# Patient Record
Sex: Female | Born: 1960 | Race: Black or African American | Hispanic: No | Smoking: Former smoker
Health system: Southern US, Community
[De-identification: ages and names within clinical notes are randomized; demographics above are authoritative.]

## PROBLEM LIST (undated history)

## (undated) DIAGNOSIS — I890 Lymphedema, not elsewhere classified: Secondary | ICD-10-CM

## (undated) DIAGNOSIS — Z9221 Personal history of antineoplastic chemotherapy: Secondary | ICD-10-CM

## (undated) DIAGNOSIS — F32A Depression, unspecified: Secondary | ICD-10-CM

## (undated) DIAGNOSIS — K5900 Constipation, unspecified: Secondary | ICD-10-CM

## (undated) DIAGNOSIS — B009 Herpesviral infection, unspecified: Secondary | ICD-10-CM

## (undated) DIAGNOSIS — J40 Bronchitis, not specified as acute or chronic: Secondary | ICD-10-CM

## (undated) DIAGNOSIS — Z803 Family history of malignant neoplasm of breast: Secondary | ICD-10-CM

## (undated) DIAGNOSIS — M2550 Pain in unspecified joint: Secondary | ICD-10-CM

## (undated) DIAGNOSIS — S8292XA Unspecified fracture of left lower leg, initial encounter for closed fracture: Secondary | ICD-10-CM

## (undated) DIAGNOSIS — I10 Essential (primary) hypertension: Secondary | ICD-10-CM

## (undated) DIAGNOSIS — M1990 Unspecified osteoarthritis, unspecified site: Secondary | ICD-10-CM

## (undated) DIAGNOSIS — E785 Hyperlipidemia, unspecified: Secondary | ICD-10-CM

## (undated) DIAGNOSIS — R519 Headache, unspecified: Secondary | ICD-10-CM

## (undated) DIAGNOSIS — G9332 Myalgic encephalomyelitis/chronic fatigue syndrome: Secondary | ICD-10-CM

## (undated) DIAGNOSIS — R0602 Shortness of breath: Secondary | ICD-10-CM

## (undated) DIAGNOSIS — R7303 Prediabetes: Secondary | ICD-10-CM

## (undated) DIAGNOSIS — C50919 Malignant neoplasm of unspecified site of unspecified female breast: Secondary | ICD-10-CM

## (undated) DIAGNOSIS — C801 Malignant (primary) neoplasm, unspecified: Secondary | ICD-10-CM

## (undated) DIAGNOSIS — R011 Cardiac murmur, unspecified: Secondary | ICD-10-CM

## (undated) DIAGNOSIS — Z91018 Allergy to other foods: Secondary | ICD-10-CM

## (undated) DIAGNOSIS — E119 Type 2 diabetes mellitus without complications: Secondary | ICD-10-CM

## (undated) DIAGNOSIS — G4733 Obstructive sleep apnea (adult) (pediatric): Secondary | ICD-10-CM

## (undated) DIAGNOSIS — Z9989 Dependence on other enabling machines and devices: Secondary | ICD-10-CM

## (undated) DIAGNOSIS — F419 Anxiety disorder, unspecified: Secondary | ICD-10-CM

## (undated) DIAGNOSIS — M549 Dorsalgia, unspecified: Secondary | ICD-10-CM

## (undated) DIAGNOSIS — F329 Major depressive disorder, single episode, unspecified: Secondary | ICD-10-CM

## (undated) DIAGNOSIS — R51 Headache: Secondary | ICD-10-CM

## (undated) DIAGNOSIS — R5382 Chronic fatigue, unspecified: Secondary | ICD-10-CM

## (undated) DIAGNOSIS — Z923 Personal history of irradiation: Secondary | ICD-10-CM

## (undated) DIAGNOSIS — K219 Gastro-esophageal reflux disease without esophagitis: Secondary | ICD-10-CM

## (undated) HISTORY — DX: Family history of malignant neoplasm of breast: Z80.3

## (undated) HISTORY — DX: Hyperlipidemia, unspecified: E78.5

## (undated) HISTORY — DX: Herpesviral infection, unspecified: B00.9

## (undated) HISTORY — DX: Myalgic encephalomyelitis/chronic fatigue syndrome: G93.32

## (undated) HISTORY — DX: Allergy to other foods: Z91.018

## (undated) HISTORY — PX: ABDOMINAL HYSTERECTOMY: SHX81

## (undated) HISTORY — DX: Dorsalgia, unspecified: M54.9

## (undated) HISTORY — DX: Lymphedema, not elsewhere classified: I89.0

## (undated) HISTORY — PX: HAND SURGERY: SHX662

## (undated) HISTORY — DX: Shortness of breath: R06.02

## (undated) HISTORY — DX: Essential (primary) hypertension: I10

## (undated) HISTORY — DX: Constipation, unspecified: K59.00

## (undated) HISTORY — PX: EYE SURGERY: SHX253

## (undated) HISTORY — DX: Pain in unspecified joint: M25.50

## (undated) HISTORY — DX: Prediabetes: R73.03

## (undated) HISTORY — DX: Malignant neoplasm of unspecified site of unspecified female breast: C50.919

## (undated) HISTORY — DX: Chronic fatigue, unspecified: R53.82

## (undated) HISTORY — PX: TUBAL LIGATION: SHX77

---
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
## 2006-04-06 ENCOUNTER — Ambulatory Visit: Payer: Self-pay | Admitting: Ophthalmology

## 2006-04-12 ENCOUNTER — Ambulatory Visit: Payer: Self-pay | Admitting: Ophthalmology

## 2015-08-06 ENCOUNTER — Telehealth: Payer: Self-pay

## 2015-08-06 NOTE — Telephone Encounter (Signed)
PATIENT RECEIVED LETTER TO SCHEDULE TCS [phone]

## 2015-08-12 ENCOUNTER — Telehealth: Payer: Self-pay

## 2015-08-18 NOTE — Telephone Encounter (Signed)
See triage

## 2015-08-18 NOTE — Telephone Encounter (Signed)
Gastroenterology Pre-Procedure Review  Request Date: 08/12/2015 Requesting Physician: Dr. Scotty Court  PATIENT REVIEW QUESTIONS: The patient responded to the following health history questions as indicated:    1. Diabetes Melitis: YES 2. Joint replacements in the past 12 months: no 3. Major health problems in the past 3 months: no 4. Has an artificial valve or MVP: pt said Dr. Scotty Court said she has a heart murmur 5. Has a defibrillator: no 6. Has been advised in past to take antibiotics in advance of a procedure like teeth cleaning  YES 7. Family history of colon cancer: no  8. Alcohol Use: no 9. History of sleep apnea: YES    She has a c-pap machine    MEDICATIONS & ALLERGIES:    Patient reports the following regarding taking any blood thinners:   Plavix? no Aspirin? no Coumadin? no  Patient confirms/reports the following medications:  Current Outpatient Prescriptions  Medication Sig Dispense Refill  . amLODipine (NORVASC) 5 MG tablet Take 5 mg by mouth daily.    . Ascorbic Acid (VITAMIN C) 1000 MG tablet Take 1,000 mg by mouth daily.    . cetirizine (ZYRTEC) 10 MG tablet Take 10 mg by mouth daily. Pt takes 2 tablets every day    . cyanocobalamin 1000 MCG tablet Take 100 mcg by mouth daily.    . fluticasone (FLONASE) 50 MCG/ACT nasal spray Place into both nostrils daily.    . folic acid (FOLVITE) Q000111Q MCG tablet Take 400 mcg by mouth daily.    Marland Kitchen glipiZIDE (GLUCOTROL XL) 5 MG 24 hr tablet Take 5 mg by mouth daily with breakfast.    . metFORMIN (GLUMETZA) 500 MG (MOD) 24 hr tablet Take 500 mg by mouth daily with breakfast. Takes 2 tablets in the Am and one tablet at might    . NON FORMULARY Vitamin D3   5000 IU   Two tablets daily    . NON FORMULARY Probiotic     One tablet daily    . omeprazole (PRILOSEC) 20 MG capsule Take 20 mg by mouth daily.    Marland Kitchen PARoxetine (PAXIL) 10 MG tablet Take 10 mg by mouth at bedtime.    . potassium chloride (MICRO-K) 10 MEQ CR capsule Take 10 mEq by mouth 2  (two) times daily.    . ranitidine (ZANTAC) 150 MG tablet Take 150 mg by mouth daily before supper.    Marland Kitchen tiZANidine (ZANAFLEX) 4 MG capsule Take 4 mg by mouth 3 (three) times daily.    Marland Kitchen topiramate (TOPAMAX) 100 MG tablet Take 100 mg by mouth daily.     No current facility-administered medications for this visit.    Patient confirms/reports the following allergies:  Allergies  Allergen Reactions  . Asa [Aspirin]   . Nsaids   . Penicillins     No orders of the defined types were placed in this encounter.    AUTHORIZATION INFORMATION Primary Insurance:   ID #:   Group #:  Pre-Cert / Auth required:  Pre-Cert / Auth #:   Secondary Insurance:   ID #:   Group #:  Pre-Cert / Auth required:  Pre-Cert / Auth #:   SCHEDULE INFORMATION: Procedure has been scheduled as follows:  Date: 09/02/2015               Time:  11:15 Am Location: [facility] Short Stay  This Gastroenterology Pre-Precedure Review Form is being routed to the following provider(s): Barney Drain, MD

## 2015-08-20 NOTE — Telephone Encounter (Signed)
SUPREP SPLIT DOSING-REGULAR breakfast then CLEAR LIQUIDS after 9 am.  CONTINUE GLUCOPHAGE. HOLD GLIPIZIDE ON THE MORNING OF TCS.

## 2015-08-26 ENCOUNTER — Telehealth: Payer: Self-pay

## 2015-08-26 ENCOUNTER — Other Ambulatory Visit: Payer: Self-pay

## 2015-08-26 DIAGNOSIS — Z1211 Encounter for screening for malignant neoplasm of colon: Secondary | ICD-10-CM

## 2015-08-26 MED ORDER — NA SULFATE-K SULFATE-MG SULF 17.5-3.13-1.6 GM/177ML PO SOLN: 1.0000 | ORAL | Status: DC

## 2015-08-26 MED ORDER — PEG 3350-KCL-NA BICARB-NACL 420 G PO SOLR: 4000.0000 mL | ORAL | Status: DC

## 2015-08-26 NOTE — Telephone Encounter (Signed)
Rx sent to the pharmacy and instructions mailed to pt.  

## 2015-08-26 NOTE — Telephone Encounter (Signed)
Pt came by the office and said she cannot afford the [place]. I have sent in Trilyte prep and mailed new instructions.

## 2015-08-26 NOTE — Telephone Encounter (Signed)
I went on line with [employer] and PA was suspended for the screening colonoscopy. NOT REQUIRED.

## 2015-09-02 ENCOUNTER — Encounter (HOSPITAL_COMMUNITY): Payer: Self-pay | Admitting: *Deleted

## 2015-09-02 ENCOUNTER — Encounter (HOSPITAL_COMMUNITY)
Admission: RE | Disposition: A | Discharge status: Home Health | Payer: Self-pay | Source: Ambulatory Visit | Attending: Gastroenterology

## 2015-09-02 ENCOUNTER — Ambulatory Visit (HOSPITAL_COMMUNITY)
Admission: RE | Admit: 2015-09-02 | Discharge: 2015-09-02 | Disposition: A | Discharge status: Home Health | Payer: Medicare HMO | Source: Ambulatory Visit | Attending: Gastroenterology | Admitting: Gastroenterology

## 2015-09-02 DIAGNOSIS — K648 Other hemorrhoids: Secondary | ICD-10-CM | POA: Diagnosis not present

## 2015-09-02 DIAGNOSIS — G4733 Obstructive sleep apnea (adult) (pediatric): Secondary | ICD-10-CM | POA: Diagnosis not present

## 2015-09-02 DIAGNOSIS — E119 Type 2 diabetes mellitus without complications: Secondary | ICD-10-CM | POA: Insufficient documentation

## 2015-09-02 DIAGNOSIS — K5730 Diverticulosis of large intestine without perforation or abscess without bleeding: Secondary | ICD-10-CM | POA: Insufficient documentation

## 2015-09-02 DIAGNOSIS — K5790 Diverticulosis of intestine, part unspecified, without perforation or abscess without bleeding: Secondary | ICD-10-CM | POA: Diagnosis not present

## 2015-09-02 DIAGNOSIS — Z87891 Personal history of nicotine dependence: Secondary | ICD-10-CM | POA: Diagnosis not present

## 2015-09-02 DIAGNOSIS — Z1211 Encounter for screening for malignant neoplasm of colon: Secondary | ICD-10-CM | POA: Insufficient documentation

## 2015-09-02 HISTORY — DX: Obstructive sleep apnea (adult) (pediatric): Z99.89

## 2015-09-02 HISTORY — DX: Type 2 diabetes mellitus without complications: E11.9

## 2015-09-02 HISTORY — DX: Headache, unspecified: R51.9

## 2015-09-02 HISTORY — DX: Obstructive sleep apnea (adult) (pediatric): G47.33

## 2015-09-02 HISTORY — DX: Headache: R51

## 2015-09-02 HISTORY — PX: COLONOSCOPY: SHX5424

## 2015-09-02 SURGERY — COLONOSCOPY
Anesthesia: Moderate Sedation

## 2015-09-02 MED ORDER — MIDAZOLAM HCL 5 MG/5ML IJ SOLN
INTRAMUSCULAR | Status: DC | PRN
  Administered 2015-09-02 (×2): 2 mg via INTRAVENOUS
  Administered 2015-09-02: 1 mg via INTRAVENOUS

## 2015-09-02 MED ORDER — MEPERIDINE HCL 100 MG/ML IJ SOLN
INTRAMUSCULAR | Status: DC | PRN
  Administered 2015-09-02 (×3): 25 mg via INTRAVENOUS

## 2015-09-02 MED ORDER — STERILE WATER FOR IRRIGATION IR SOLN
Status: DC | PRN
  Administered 2015-09-02: 12:00:00

## 2015-09-02 MED ORDER — MEPERIDINE HCL 100 MG/ML IJ SOLN
INTRAMUSCULAR | Status: AC
  Filled 2015-09-02: qty 2

## 2015-09-02 MED ORDER — MIDAZOLAM HCL 5 MG/5ML IJ SOLN
INTRAMUSCULAR | Status: AC
  Filled 2015-09-02: qty 10

## 2015-09-02 MED ORDER — SODIUM CHLORIDE 0.9 % IV SOLN
INTRAVENOUS | Status: DC
  Administered 2015-09-02: 1000 mL via INTRAVENOUS

## 2015-09-02 NOTE — Op Note (Signed)
[facility] [address] [place], [address]   COLONOSCOPY PROCEDURE REPORT  PATIENT: Emma Casey, Emma Casey  MR#: [id number] BIRTHDATE: 07-13-1961 , 73  yrs. old GENDER: female ENDOSCOPIST: Danie Binder, MD REFERRED [id number] Tapper, M.D. PROCEDURE DATE:  2015-09-03 PROCEDURE:   Colonoscopy, screening INDICATIONS:average risk patient for colon cancer. MEDICATIONS: Demerol 75 mg IV and Versed 5 mg IV MD INITIATED/ORDERED 1159. LAST DOSE: 1209, TCS COMPLETE: 1236  DESCRIPTION OF PROCEDURE:    Physical exam was performed.  Informed consent was obtained from the patient after explaining the benefits, risks, and alternatives to procedure.  The patient was connected to monitor and placed in left lateral position. Continuous oxygen was provided by nasal cannula and IV medicine administered through an indwelling cannula.  After administration of sedation and rectal exam, the patients rectum was intubated and the [id number]  colonoscope was advanced under direct visualization to the cecum.  The scope was removed slowly by carefully examining the color, texture, anatomy, and integrity mucosa on the way out.  The patient was recovered in endoscopy and discharged home in satisfactory condition. Estimated blood loss is zero unless otherwise noted in this procedure report.    COLON FINDINGS: There was mild diverticulosis noted in the sigmoid colon.  , Small internal hemorrhoids were found.  , and The examination was otherwise normal.  PREP QUALITY: excellent.  CECAL W/D TIME: 12       minutes  COMPLICATIONS: None  ENDOSCOPIC IMPRESSION: 1.   Mild diverticulosis in the sigmoid colon 2.   Small internal hemorrhoids  RECOMMENDATIONS: CONTINUE WEIGHT LOSS EFFORTS. Follow a HIGH FIBER/LOW FAT DIET. USE PREPARATION H FOUR TIMES A DAY PRN Next colonoscopy in 10 years.      _______________________________ eSignedDanie Binder, MD 2015/09/03 6:55 PM    CPT  CODES: ICD CODES:  The ICD and CPT codes recommended by this software are interpretations from the data that the clinical staff has captured with the software.  The verification of the translation of this report to the ICD and CPT codes and modifiers is the sole responsibility of the health care institution and practicing physician where this report was generated.  [place]. will not be held responsible for the validity of the ICD and CPT codes included on this report.  AMA assumes no liability for data contained or not contained herein. CPT is a Designer, television/film set of the [employer].

## 2015-09-02 NOTE — H&P (Signed)
Primary Care Physician:  Deloria Lair, MD Primary Gastroenterologist:  Dr. Oneida Alar  Pre-Procedure History & Physical: HPI:  Emma Casey is a 55 y.o. female here for [place].  Past Medical History  Diagnosis Date  . Diabetes mellitus without complication ([place])   . OSA on CPAP   . Headache     Past Surgical History  Procedure Laterality Date  . Abdominal hysterectomy    . Tubal ligation    . Eye surgery Right     cataract  . Hand surgery Right     broken bone    Prior to Admission medications   Medication Sig Start Date End Date Taking? Authorizing Provider  amLODipine (NORVASC) 5 MG tablet Take 5 mg by mouth daily.   Yes Historical Provider, MD  Ascorbic Acid (VITAMIN C) 1000 MG tablet Take 1,000 mg by mouth daily.   Yes Historical Provider, MD  cetirizine (ZYRTEC) 10 MG tablet Take 10 mg by mouth daily. Pt takes 2 tablets every day   Yes Historical Provider, MD  cyanocobalamin 1000 MCG tablet Take 100 mcg by mouth daily.   Yes Historical Provider, MD  fluticasone (FLONASE) 50 MCG/ACT nasal spray Place into both nostrils daily.   Yes Historical Provider, MD  folic acid (FOLVITE) 704 MCG tablet Take 400 mcg by mouth daily.   Yes Historical Provider, MD  glipiZIDE (GLUCOTROL XL) 5 MG 24 hr tablet Take 5 mg by mouth daily with breakfast.   Yes Historical Provider, MD  metFORMIN (GLUMETZA) 500 MG (MOD) 24 hr tablet Take 500 mg by mouth daily with breakfast. Takes 2 tablets in the Am and one tablet at might   Yes Historical Provider, MD  Na Sulfate-K Sulfate-Mg Sulf (SUPREP BOWEL PREP) SOLN Take 1 kit by mouth as directed. 08/26/15  Yes Danie Binder, MD  NON FORMULARY Vitamin D3   5000 IU   Two tablets daily   Yes Historical Provider, MD  NON FORMULARY Probiotic     One tablet daily   Yes Historical Provider, MD  omeprazole (PRILOSEC) 20 MG capsule Take 20 mg by mouth daily.   Yes Historical Provider, MD  PARoxetine (PAXIL) 10 MG tablet Take 10 mg by mouth at  bedtime.   Yes Historical Provider, MD  polyethylene glycol-electrolytes (TRILYTE) 420 g solution Take 4,000 mLs by mouth as directed. 08/26/15  Yes Danie Binder, MD  potassium chloride (MICRO-K) 10 MEQ CR capsule Take 10 mEq by mouth 2 (two) times daily.   Yes Historical Provider, MD  ranitidine (ZANTAC) 150 MG tablet Take 150 mg by mouth daily before supper.   Yes Historical Provider, MD  tiZANidine (ZANAFLEX) 4 MG capsule Take 4 mg by mouth 3 (three) times daily.   Yes Historical Provider, MD  topiramate (TOPAMAX) 100 MG tablet Take 100 mg by mouth daily.   Yes Historical Provider, MD    Allergies as of 08/26/2015 - Review Complete 08/18/2015  Allergen Reaction Noted  . Asa [aspirin]  08/12/2015  . Nsaids  08/18/2015  . Penicillins  08/18/2015    Family History  Problem Relation Age of Onset  . Transient ischemic attack Father   . Heart attack Father   . Stroke Brother     Social History   Social History  . Marital Status: Married    Spouse Name: N/A  . Number of Children: N/A  . Years of Education: N/A   Occupational History  . Not on file.   Social History Main Topics  .  Smoking status: Former Research scientist (life sciences)  . Smokeless tobacco: Not on file  . Alcohol Use: No  . Drug Use: No  . Sexual Activity: Not on file   Other Topics Concern  . Not on file   Social History Narrative  . No narrative on file    Review of Systems: See HPI, otherwise negative ROS   Physical Exam: BP 129/62 mmHg  Pulse 65  Temp(Src) 97.6 F (36.4 C) (Oral)  Resp 16  Ht 5' 6"  (1.676 m)  Wt 247 lb (112.038 kg)  BMI 39.89 kg/m2  SpO2 100% General:   Alert,  pleasant and cooperative in NAD Head:  Normocephalic and atraumatic. Neck:  Supple; Lungs:  Clear throughout to auscultation.    Heart:  Regular rate and rhythm. Abdomen:  Soft, nontender and nondistended. Normal bowel sounds, without guarding, and without rebound.   Neurologic:  Alert and  oriented x4;  grossly normal  neurologically.  Impression/Plan:    SCREENING  Plan:  1. TCS TODAY

## 2015-09-02 NOTE — Discharge Instructions (Signed)
YOU DID NOT HAVE ANY POLYPS. You have internal hemorrhoids, which MAY CAUSE RECTAL BLEEDING.  YOU HAVE diverticulosis IN YOUR LEFT COLON.   CONTINUE YOUR WEIGHT LOSS EFFORTS. YOUR BODY MASS INDEX IS almost 40, WHICH MEANS YOU ARE AT LEAST 100 LBS OVERWEIGHT. OBESITY IS ASSOCIATED WITH AN INCREASE RISK OF ALL CANCERS, ESPECIALLY COLON CANCER.  Follow a HIGH FIBER/LOW FAT DIET. AVOID ITEMS THAT CAUSE BLOATING. See info below.  USE PREPARATION H FOUR TIMES A DAY FOR 7 DAYS IF YOU HAVE RECTAL BLEEDING.   Next colonoscopy in 10 years.  Colonoscopy Care After Read the instructions outlined below and refer to this sheet in the next week. These discharge instructions provide you with general information on caring for yourself after you leave the hospital. While your treatment has been planned according to the most current medical practices available, unavoidable complications occasionally occur. If you have any problems or questions after discharge, call DR. Enriqueta Augusta, [phone].  ACTIVITY  You may resume your regular activity, but move at a slower pace for the next 24 hours.   Take frequent rest periods for the next 24 hours.   Walking will help get rid of the air and reduce the bloated feeling in your belly (abdomen).   No driving for 24 hours (because of the medicine (anesthesia) used during the test).   You may shower.   Do not sign any important legal documents or operate any machinery for 24 hours (because of the anesthesia used during the test).    NUTRITION  Drink plenty of fluids.   You may resume your normal diet as instructed by your doctor.   Begin with a light meal and progress to your normal diet. Heavy or fried foods are harder to digest and may make you feel sick to your stomach (nauseated).   Avoid alcoholic beverages for 24 hours or as instructed.    MEDICATIONS  You may resume your normal medications.   WHAT YOU CAN EXPECT TODAY  Some feelings of bloating in  the abdomen.   Passage of more gas than usual.   Spotting of blood in your stool or on the toilet paper  .  IF YOU HAD POLYPS REMOVED DURING THE COLONOSCOPY:  Eat a soft diet IF YOU HAVE NAUSEA, BLOATING, ABDOMINAL PAIN, OR VOMITING.    FINDING OUT THE RESULTS OF YOUR TEST Not all test results are available during your visit. DR. Oneida Alar WILL CALL YOU WITHIN 7 DAYS OF YOUR PROCEDUE WITH YOUR RESULTS. Do not assume everything is normal if you have not heard from DR. Leith Hedlund IN ONE WEEK, CALL HER OFFICE AT [phone].  SEEK IMMEDIATE MEDICAL ATTENTION AND CALL THE OFFICE: [phone] IF:  You have more than a spotting of blood in your stool.   Your belly is swollen (abdominal distention).   You are nauseated or vomiting.   You have a temperature over 101F.   You have abdominal pain or discomfort that is severe or gets worse throughout the day.  High-Fiber Diet A high-fiber diet changes your normal diet to include more whole grains, legumes, fruits, and vegetables. Changes in the diet involve replacing refined carbohydrates with unrefined foods. The calorie level of the diet is essentially unchanged. The Dietary Reference Intake (recommended amount) for adult males is 38 grams per day. For adult females, it is 25 grams per day. Pregnant and lactating women should consume 28 grams of fiber per day. Fiber is the intact part of a plant that is not broken down  during digestion. Functional fiber is fiber that has been isolated from the plant to provide a beneficial effect in the body. PURPOSE  Increase stool bulk.   Ease and regulate bowel movements.   Lower cholesterol.  REDUCE RISK OF COLON CANCER  INDICATIONS THAT YOU NEED MORE FIBER  Constipation and hemorrhoids.   Uncomplicated diverticulosis (intestine condition) and irritable bowel syndrome.   Weight management.   As a protective measure against hardening of the arteries (atherosclerosis), diabetes, and cancer.    GUIDELINES FOR INCREASING FIBER IN THE DIET  Start adding fiber to the diet slowly. A gradual increase of about 5 more grams (2 slices of whole-wheat bread, 2 servings of most fruits or vegetables, or 1 bowl of high-fiber cereal) per day is best. Too rapid an increase in fiber may result in constipation, flatulence, and bloating.   Drink enough water and fluids to keep your urine clear or pale yellow. Water, juice, or caffeine-free drinks are recommended. Not drinking enough fluid may cause constipation.   Eat a variety of high-fiber foods rather than one type of fiber.   Try to increase your intake of fiber through using high-fiber foods rather than fiber pills or supplements that contain small amounts of fiber.   The goal is to change the types of food eaten. Do not supplement your present diet with high-fiber foods, but replace foods in your present diet.  INCLUDE A VARIETY OF FIBER SOURCES  Replace refined and processed grains with whole grains, canned fruits with fresh fruits, and incorporate other fiber sources. White rice, white breads, and most bakery goods contain little or no fiber.   Brown whole-grain rice, buckwheat oats, and many fruits and vegetables are all good sources of fiber. These include: broccoli, Brussels sprouts, cabbage, cauliflower, beets, sweet potatoes, white potatoes (skin on), carrots, tomatoes, eggplant, squash, berries, fresh fruits, and dried fruits.   Cereals appear to be the richest source of fiber. Cereal fiber is found in whole grains and bran. Bran is the fiber-rich outer coat of cereal grain, which is largely removed in refining. In whole-grain cereals, the bran remains. In breakfast cereals, the largest amount of fiber is found in those with "bran" in their names. The fiber content is sometimes indicated on the label.   You may need to include additional fruits and vegetables each day.   In baking, for 1 cup white flour, you may use the following  substitutions:   1 cup whole-wheat flour minus 2 tablespoons.   1/2 cup white flour plus 1/2 cup whole-wheat flour.   Diverticulosis Diverticulosis is a common condition that develops when small pouches (diverticula) form in the wall of the colon. The risk of diverticulosis increases with age. It happens more often in people who eat a low-fiber diet. Most individuals with diverticulosis have no symptoms. Those individuals with symptoms usually experience belly (abdominal) pain, constipation, or loose stools (diarrhea).  HOME CARE INSTRUCTIONS  Increase the amount of fiber in your diet as directed by your caregiver or dietician. This may reduce symptoms of diverticulosis.   Drink at least 6 to 8 glasses of water each day to prevent constipation.   Try not to strain when you have a bowel movement.   Avoiding nuts and seeds to prevent complications is NOT NECESSARY.    FOODS HAVING HIGH FIBER CONTENT INCLUDE:  Fruits. Apple, peach, pear, tangerine, raisins, prunes.   Vegetables. Brussels sprouts, asparagus, broccoli, cabbage, carrot, cauliflower, romaine lettuce, spinach, summer squash, tomato, winter squash, zucchini.  Starchy Vegetables. Baked beans, kidney beans, lima beans, split peas, lentils, potatoes (with skin).   Grains. Whole wheat bread, brown rice, bran flake cereal, plain oatmeal, white rice, shredded wheat, bran muffins.   SEEK IMMEDIATE MEDICAL CARE IF:  You develop increasing pain or severe bloating.   You have an oral temperature above 101F.   You develop vomiting or bowel movements that are bloody or black.   Hemorrhoids Hemorrhoids are dilated (enlarged) veins around the rectum. Sometimes clots will form in the veins. This makes them swollen and painful. These are called thrombosed hemorrhoids. Causes of hemorrhoids include:  Constipation.   Straining to have a bowel movement.   HEAVY LIFTING  HOME CARE INSTRUCTIONS  Eat a well balanced diet and  drink 6 to 8 glasses of water every day to avoid constipation. You may also use a bulk laxative.   Avoid straining to have bowel movements.   Keep anal area dry and clean.   Do not use a donut shaped pillow or sit on the toilet for long periods. This increases blood pooling and pain.   Move your bowels when your body has the urge; this will require less straining and will decrease pain and pressure.

## 2015-09-03 LAB — GLUCOSE, CAPILLARY: GLUCOSE-CAPILLARY: 88 mg/dL (ref 65–99)

## 2015-09-04 ENCOUNTER — Encounter (HOSPITAL_COMMUNITY): Payer: Self-pay | Admitting: Gastroenterology

## 2017-10-02 DIAGNOSIS — I10 Essential (primary) hypertension: Secondary | ICD-10-CM | POA: Insufficient documentation

## 2017-10-02 DIAGNOSIS — E118 Type 2 diabetes mellitus with unspecified complications: Secondary | ICD-10-CM | POA: Insufficient documentation

## 2017-10-02 DIAGNOSIS — K219 Gastro-esophageal reflux disease without esophagitis: Secondary | ICD-10-CM | POA: Insufficient documentation

## 2017-10-02 DIAGNOSIS — G4733 Obstructive sleep apnea (adult) (pediatric): Secondary | ICD-10-CM | POA: Insufficient documentation

## 2017-10-02 DIAGNOSIS — E785 Hyperlipidemia, unspecified: Secondary | ICD-10-CM | POA: Insufficient documentation

## 2018-10-30 NOTE — Congregational Nurse Program (Signed)
  Dept: [phone]   Congregational Nurse Program Note  Date of Encounter: 10/30/2018  Past Medical History: Past Medical History:  Diagnosis Date  . Diabetes mellitus without complication ([place])   . Headache   . OSA on CPAP     Encounter Details: CNP Questionnaire - 10/30/18 1020      Questionnaire   Patient Status  Not Applicable    Race  Black or African American    Location Patient [place]  Medicare    Uninsured  Not Applicable    Food  Yes, have food insecurities    Housing/Utilities  Yes, have permanent housing    Transportation  No transportation needs    Interpersonal Safety  Yes, feel physically and emotionally safe where you currently live    Medication  No medication insecurities    Medical Provider  Yes    Referrals  Not Applicable    ED Visit Averted  Not Applicable    Life-Saving Intervention Made  Not Applicable     21/19/4174  Want B/P and blood sugar checked.  B/P 123/87 Pulse 76. Blood Sugar 158 did not take Metformin yet. Wanted to her appointment, will take her meds as soon as she gets home.  Advised to take medication as directed by doctor.  Theron Arista, RN [phone]- [phone]

## 2018-11-21 DIAGNOSIS — C801 Malignant (primary) neoplasm, unspecified: Secondary | ICD-10-CM

## 2018-11-21 HISTORY — DX: Malignant (primary) neoplasm, unspecified: C80.1

## 2018-11-28 ENCOUNTER — Other Ambulatory Visit: Payer: Self-pay | Admitting: Family Medicine

## 2018-11-28 DIAGNOSIS — N6320 Unspecified lump in the left breast, unspecified quadrant: Secondary | ICD-10-CM

## 2018-11-28 DIAGNOSIS — N6489 Other specified disorders of breast: Secondary | ICD-10-CM

## 2018-12-17 ENCOUNTER — Ambulatory Visit
Admission: RE | Admit: 2018-12-17 | Discharge: 2018-12-17 | Disposition: A | Discharge status: Home / Self Care | Payer: Medicare HMO | Source: Ambulatory Visit | Attending: Family Medicine | Admitting: Family Medicine

## 2018-12-17 ENCOUNTER — Other Ambulatory Visit: Payer: Self-pay

## 2018-12-17 DIAGNOSIS — N6489 Other specified disorders of breast: Secondary | ICD-10-CM

## 2018-12-17 DIAGNOSIS — N6320 Unspecified lump in the left breast, unspecified quadrant: Secondary | ICD-10-CM

## 2018-12-18 ENCOUNTER — Telehealth: Payer: Self-pay | Admitting: *Deleted

## 2018-12-18 NOTE — Telephone Encounter (Signed)
Error

## 2018-12-19 ENCOUNTER — Telehealth: Payer: Self-pay | Admitting: Hematology

## 2018-12-19 NOTE — Telephone Encounter (Signed)
Spoke to patient to confirm morning [facility] appointment for 5/6, packet mailed to patient

## 2018-12-20 ENCOUNTER — Encounter: Payer: Self-pay | Admitting: *Deleted

## 2018-12-20 DIAGNOSIS — Z170 Estrogen receptor positive status [ER+]: Principal | ICD-10-CM

## 2018-12-20 DIAGNOSIS — C50212 Malignant neoplasm of upper-inner quadrant of left female breast: Secondary | ICD-10-CM

## 2018-12-24 NOTE — Progress Notes (Signed)
[place]   [phone] Fax:([phone]) [place] Note   Patient Care Team: Sandi Mealy, MD as PCP - General (Family Medicine) Mauro Kaufmann, RN as Oncology Nurse Navigator Rockwell Germany, RN as Oncology Nurse Navigator Fanny Skates, MD as Consulting Physician (General Surgery) Truitt Merle, MD as Consulting Physician (Hematology) Kyung Rudd, MD as Consulting Physician (Radiation Oncology)  Date of Service:  12/26/2018   CHIEF COMPLAINTS/PURPOSE OF CONSULTATION:  Newly diagnosed Malignant neoplasm of upper-inner quadrant of left breast    Oncology History   Cancer Staging Malignant neoplasm of upper-inner quadrant of left breast in female, estrogen receptor positive ([place]) Staging form: Breast, AJCC 8th Edition - Clinical stage from 12/17/2018: Stage Unknown (cTX, cN0, cM0, G2, ER+, PR+, HER2-) - Signed by Truitt Merle, MD on 12/25/2018       Malignant neoplasm of upper-inner quadrant of left breast in female, estrogen receptor positive ([place])   11/28/2018 Mammogram    Diagnostic Mammogram 11/28/18 IMPRESSION:  Suspcious mass in upper inner quadrant of the left breast  at posterior depth.     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    12/17/2018 Cancer Staging    Staging form: Breast, AJCC 8th Edition - Clinical stage from 12/17/2018: Stage Unknown (cTX, cN0, cM0, G2, ER+, PR+, HER2-) - Signed by Truitt Merle, MD on 12/25/2018    12/20/2018 Initial Diagnosis    Malignant neoplasm of upper-inner quadrant of left breast in female, estrogen receptor positive ([place])    12/20/2018 Receptors her2    Results: IMMUNOHISTOCHEMICAL AND MORPHOMETRIC ANALYSIS PERFORMED MANUALLY The tumor cells are NEGATIVE for Her2 (1+). Estrogen Receptor: 95%, POSITIVE, STRONG STAINING INTENSITY Progesterone Receptor: 30%, POSITIVE,  STRONG STAINING INTENSITY Proliferation Marker Ki67: 20%      HISTORY OF PRESENTING ILLNESS:  Emma Casey 58 y.o. female is a here because of newly diagnosed left breast cancer. The patient presents to the Breast clinic today by herself.   She notes this mass was found by screening mammogram. She did not feel the mass herself. She denies any breast, nipple change. She denies having breast  Today she notes left breast tingling. She also has arthritis and pain in her right wrists after injury. She is on disability due to this and not working. She denies any other new pain or changes. She denies weight loss although she was about to try to lose weight with diet.   Socially she lives by herself, single and does not have children. She does have family members. She is able to take care of herself. Overall but is interested in help at home.  She quit smoking but started again after recent diagnosis. Before she quit she smoked for a total of 10 years.  They have a PMHx of DM and HTN. She had 2 eyes surgeries and tubal ligation. Her maternal aunt had breast cancer that she knows of. She had complete hysterectomy and BSO due to recurrent cysts.   GYN HISTORY  Menarchal: 12 LMP:  At [age] yo: Complete hysterectomy and BSO due to recurrent cysts.  Contraceptive: No  HRT: for 30 days after BSO  G0   REVIEW OF SYSTEMS:    Constitutional: Denies fevers, chills or abnormal night sweats Eyes: Denies blurriness of vision, double vision or watery eyes Ears, nose, mouth, throat, and face: Denies mucositis or sore throat  Respiratory: Denies cough, dyspnea or wheezes Cardiovascular: Denies palpitation, chest discomfort or lower extremity swelling Gastrointestinal:  Denies nausea, heartburn or change in bowel habits Skin: Denies abnormal skin rashes MKS: (+) Arthritis (+) right wrist pain and disability  Lymphatics: Denies new lymphadenopathy or easy bruising Neurological:Denies numbness, tingling or new  weaknesses Behavioral/Psych: Mood is stable, no new changes  BREAST: (+) Left breast tingling  All other systems were reviewed with the patient and are negative.   MEDICAL HISTORY:  Past Medical History:  Diagnosis Date   Diabetes mellitus without complication ([place])    Headache    Herpes 1&2    Hypertension    OSA on CPAP     SURGICAL HISTORY: Past Surgical History:  Procedure Laterality Date   ABDOMINAL HYSTERECTOMY     COLONOSCOPY N/A 09/02/2015   Procedure: COLONOSCOPY;  Surgeon: Danie Binder, MD;  Location: [facility];  Service: Endoscopy;  Laterality: N/A;  11:15 Am   EYE SURGERY Right    cataract   HAND SURGERY Right    broken bone   TUBAL LIGATION      SOCIAL HISTORY: Social History   Socioeconomic History   Marital status: Married    Spouse name: Not on file   Number of children: Not on file   Years of education: Not on file   Highest education level: Not on file  Occupational History   Not on file  Social Needs   Financial resource strain: Not on file   Food insecurity:    Worry: Not on file    Inability: Not on file   Transportation needs:    Medical: Not on file    Non-medical: Not on file  Tobacco Use   Smoking status: Current Every Day Smoker    Packs/day: 0.50    Years: 10.00    Pack years: 5.00    Types: Cigarettes   Smokeless tobacco: Never Used  Substance and Sexual Activity   Alcohol use: No   Drug use: No   Sexual activity: Not on file  Lifestyle   Physical activity:    Days per week: Not on file    Minutes per session: Not on file   Stress: Not on file  Relationships   Social connections:    Talks on phone: Not on file    Gets together: Not on file    Attends religious service: Not on file    Active member of club or organization: Not on file    Attends meetings of clubs or organizations: Not on file    Relationship status: Not on file   Intimate partner violence:    Fear of current or ex  partner: Not on file    Emotionally abused: Not on file    Physically abused: Not on file    Forced sexual activity: Not on file  Other Topics Concern   Not on file  Social History Narrative   Not on file    FAMILY HISTORY: Family History  Problem Relation Age of Onset   Transient ischemic attack Father    Heart attack Father    Stroke Brother    Breast cancer Maternal Aunt     ALLERGIES:  is allergic to asa [aspirin]; nsaids; and penicillins.  MEDICATIONS:  Current Outpatient Medications  Medication Sig Dispense Refill   acyclovir (ZOVIRAX) 400 MG tablet Take 400 mg by mouth 2 (two) times daily.     amLODipine (NORVASC) 5 MG tablet Take 5 mg by mouth daily.  Ascorbic Acid (VITAMIN C) 1000 MG tablet Take 1,000 mg by mouth daily.     atorvastatin (LIPITOR) 20 MG tablet Take 20 mg by mouth daily.     cyanocobalamin 1000 MCG tablet Take 100 mcg by mouth daily.     fluticasone (FLONASE) 50 MCG/ACT nasal spray Place into both nostrils daily.     folic acid (FOLVITE) 277 MCG tablet Take 400 mcg by mouth daily.     lisinopril (ZESTRIL) 2.5 MG tablet Take 2.5 mg by mouth at bedtime.     metFORMIN (GLUMETZA) 500 MG (MOD) 24 hr tablet Take 500 mg by mouth daily with breakfast. Takes 2 tablets in the Am and one tablet at might     montelukast (SINGULAIR) 10 MG tablet Take 10 mg by mouth at bedtime.     NON FORMULARY Vitamin D3   5000 IU   Two tablets daily     NON FORMULARY Probiotic     One tablet daily     omeprazole (PRILOSEC) 20 MG capsule Take 20 mg by mouth daily.     PARoxetine (PAXIL) 10 MG tablet Take 10 mg by mouth at bedtime.     potassium chloride (MICRO-K) 10 MEQ CR capsule Take 10 mEq by mouth 2 (two) times daily.     ranitidine (ZANTAC) 150 MG tablet Take 150 mg by mouth daily before supper.     tiZANidine (ZANAFLEX) 4 MG capsule Take 4 mg by mouth 3 (three) times daily.     topiramate (TOPAMAX) 100 MG tablet Take 100 mg by mouth daily.      glipiZIDE (GLUCOTROL XL) 5 MG 24 hr tablet Take 5 mg by mouth daily with breakfast.     No current facility-administered medications for this visit.     PHYSICAL EXAMINATION: ECOG PERFORMANCE STATUS: 2 - Symptomatic, <50% confined to bed  Vitals:   12/26/18 0903  BP: 125/66  Pulse: 78  Resp: 18  Temp: 98.3 F (36.8 C)  SpO2: 98%   Filed Weights   12/26/18 0903  Weight: 261 lb 8 oz (118.6 kg)    GENERAL:alert, no distress and comfortable SKIN: skin color, texture, turgor are normal, no rashes or significant lesions EYES: normal, conjunctiva are pink and non-injected, sclera clear OROPHARYNX:no exudate, no erythema and lips, buccal mucosa, and tongue normal  NECK: supple, thyroid normal size, non-tender, without nodularity LYMPH:  no palpable lymphadenopathy in the cervical, axillary or inguinal LUNGS: clear to auscultation and percussion with normal breathing effort HEART: regular rate & rhythm and no murmurs and no lower extremity edema ABDOMEN:abdomen soft and normal bowel sounds (+) Mild right abdominal tenderness Musculoskeletal:no cyanosis of digits and no clubbing  PSYCH: alert & oriented x 3 with fluent speech NEURO: no focal motor/sensory deficits BREAST: (+) Very mild skin ecchymosis at biopsy site (+) No palpable mass or adenopathy of either breasts.   LABORATORY DATA:  I have reviewed the data as listed CBC Latest Ref Rng & Units 12/26/2018  WBC 4.0 - 10.5 K/uL 9.3  Hemoglobin 12.0 - 15.0 g/dL 12.7  Hematocrit 36.0 - 46.0 % 43.1  Platelets 150 - 400 K/uL 334    CMP Latest Ref Rng & Units 12/26/2018  Glucose 70 - 99 mg/dL 123(H)  BUN 6 - 20 mg/dL 10  Creatinine 0.44 - 1.00 mg/dL 1.27(H)  Sodium 135 - 145 mmol/L 143  Potassium 3.5 - 5.1 mmol/L 3.7  Chloride 98 - 111 mmol/L 109  CO2 22 - 32 mmol/L 27  Calcium 8.9 -  10.3 mg/dL 9.3  Total Protein 6.5 - 8.1 g/dL 7.1  Total Bilirubin 0.3 - 1.2 mg/dL 0.8  Alkaline Phos 38 - 126 U/L 90  AST 15 - 41 U/L 11(L)    ALT 0 - 44 U/L 9     RADIOGRAPHIC STUDIES: I have personally reviewed the radiological images as listed and agreed with the findings in the report. Mm Clip Placement Left  Result Date: 12/17/2018 CLINICAL DATA:  Stereotactic biopsy was performed of a mass/distortion in the upper inner left breast, posterior third. EXAM: DIAGNOSTIC LEFT MAMMOGRAM POST STEREOTACTIC BIOPSY COMPARISON:  Previous exam(s). FINDINGS: Mammographic images were obtained following stereotactic guided biopsy of the left breast. A coil shaped biopsy clip is satisfactorily positioned within the area of concern in the upper inner quadrant. IMPRESSION: Satisfactory position of coil shaped biopsy clip. Final Assessment: Post Procedure Mammograms for Marker Placement Electronically Signed   By: Curlene Dolphin M.D.   On: 12/17/2018 11:43   Mm Lt Breast Bx W Loc Dev 1st Lesion Image Bx Spec Stereo Guide  Addendum Date: 12/19/2018   ADDENDUM REPORT: 12/18/2018 16:27 ADDENDUM: Pathology revealed GRADE 2 INVASIVE DUCTAL CARCINOMA, DUCTAL CARCINOMA IN SITU of the LEFT breast, upper inner quadrant. This was found to be concordant by Dr. Curlene Dolphin. Pathology results were discussed with the patient by telephone. The patient reported doing well after the biopsy with tenderness at the site. Post biopsy instructions and care were reviewed and questions were answered. The patient was encouraged to call [facility] for any additional concerns. Results and recommendations were relayed to Melinda Martinique RN at Dr. Glennis Brink office ([place]). Per patient request, she was referred to [facility] on 12/26/2018. Pathology results reported by Stacie Acres, RN on 12/18/2018. Electronically Signed   By: Curlene Dolphin M.D.   On: 12/18/2018 16:27   Result Date: 12/19/2018 CLINICAL DATA:  Stereotactic biopsy was recommended a  mass/asymmetry the posterior third of the upper inner left breast. Posterior third. EXAM: LEFT BREAST STEREOTACTIC CORE NEEDLE BIOPSY COMPARISON:  Previous exams. FINDINGS: The patient and I discussed the procedure of stereotactic-guided biopsy including benefits and alternatives. We discussed the high likelihood of a successful procedure. We discussed the risks of the procedure including infection, bleeding, tissue injury, clip migration, and inadequate sampling. Informed written consent was given. The usual time out protocol was performed immediately prior to the procedure. Using sterile technique and 1% Lidocaine with and without epinephrine as local anesthetic, under stereotactic guidance, a 9 gauge vacuum assisted device was used to perform core needle biopsy of a mass/distortion in the upper inner left breast using a craniocaudal approach. Specimen radiograph was performed showing tissue. Lesion quadrant: Upper inner quadrant At the conclusion of the procedure, a coil tissue marker clip was deployed into the biopsy cavity. Follow-up 2-view mammogram was performed and dictated separately. IMPRESSION: Stereotactic-guided biopsy of left breast. No apparent complications. Electronically Signed: By: Curlene Dolphin M.D. On: 12/17/2018 11:42    ASSESSMENT & PLAN:  Emma Casey is a 58 y.o. African American female with a history of DM, HTN, Depression.   1. Malignant neoplasm of upper-inner quadrant of left breast, c (Tx,N0,M0 ), likely stage I, ER/PR+,  HER2-, Grade II -We discussed her image findings and the biopsy results in great details. -Giving early stage disease, she likely need a lumpectomy and SLNB. She is agreeable with that. She was seen by  Dr. [name] today and likely will proceed with surgery soon.  -her breast tumor appears to be small on mammogram, [place] (-). If her tumor is more than 65m then I recommend a Oncotype Dx test on the surgical sample and we'll make a decision about adjuvant  chemotherapy based on the Oncotype result. Written material of this test was given to her. She is relatively young, but does have multiple comorbilities, wouold still be a candidate for chemotherapy if her Oncotype recurrence score is high. I suspect she will be low risk.  -If her surgical sentinel lymph node positive, I recommend mammaprint for further risk stratification and guide adjuvant chemotherapy. -The risk of recurrence depends on the stage and biology of the tumor. She is early stage, with ER/PR positive and HER2 negative markers. I discussed this is the more common type of slow growing tumor.  -she would benefit from adjuvant radiation after lumpectomy to reduce the risk for local recurrence. After surgery she will be referred to Rad onc Dr. MLisbeth Renshaw  -Giving the strong ER and PR expression in her postmenopausal status, I recommend adjuvant endocrine therapy with aromatase inhibitor for a total of 5-10 years to reduce the risk of cancer recurrence. -The potential benefit and side effects, which includes but not limited to, hot flash, skin and vaginal dryness, metabolic changes ( increased blood glucose, cholesterol, weight, etc.), slightly in increased risk of cardiovascular disease, cataracts, muscular and joint discomfort, osteopenia and osteoporosis, etc, were discussed with her in great details. She is interested, and we'll start after she completes radiation. -We also discussed the breast cancer surveillance after her surgery. She will continue annual screening mammogram, self exam, and a routine office visit with lab and exam with [place] -I encouraged her to have healthy diet and exercise regularly -Labs reviewed, CBC and CMP WNL except BG 123, Cr 1.27, AST 11.  -F/u after surgery or radiation.    2. HTN, DM  -On Amlodipine, lisinopril, metformin -HTN controlled -BG at 123 today (12/26/18)  3. CKD stage III -secondary to DM and HTN. Cr at 1.27 today (12/26/18) -I encouraged her to drink  plenty of water and to avoid NSAID's -Will monitor   4. Smoking Cessation  -She smoked for 10 years before she quit. She restarted after recent cancer diagnosis  -I strongly encouraged her to quit smoking completely and continue cessation. She understands and agrees.     PLAN:  -She will likely proceed with left breast lumpectomy and SLN biopsy soon -Oncotype if her tumor >667mand G2-3, or >1.0 cm G1, or mammaprint if node positive  -F/u after radiation, or sooner of Oncotype high risk    No orders of the defined types were placed in this encounter.   All questions were answered. The patient knows to call the clinic with any problems, questions or concerns. I spent 40 minutes counseling the patient face to face. The total time spent in the appointment was 50 minutes and more than 50% was on counseling.     YaTruitt MerleMD 12/26/2018 12:09 PM  I, AmJoslyn Devonam acting as scribe for YaTruitt MerleMD.   I have reviewed the above documentation for accuracy and completeness, and I agree with the above.

## 2018-12-26 ENCOUNTER — Inpatient Hospital Stay: Payer: Medicare HMO

## 2018-12-26 ENCOUNTER — Ambulatory Visit: Payer: Medicare HMO | Attending: Radiation Oncology | Admitting: Radiation Oncology

## 2018-12-26 ENCOUNTER — Encounter: Payer: Self-pay | Admitting: Genetic Counselor

## 2018-12-26 ENCOUNTER — Inpatient Hospital Stay: Payer: Medicare HMO | Attending: Hematology | Admitting: Hematology

## 2018-12-26 ENCOUNTER — Telehealth: Payer: Self-pay | Admitting: Emergency Medicine

## 2018-12-26 ENCOUNTER — Other Ambulatory Visit: Payer: Self-pay

## 2018-12-26 ENCOUNTER — Telehealth: Payer: Self-pay | Admitting: Hematology

## 2018-12-26 ENCOUNTER — Other Ambulatory Visit: Payer: Self-pay | Admitting: General Surgery

## 2018-12-26 ENCOUNTER — Telehealth: Payer: Self-pay

## 2018-12-26 VITALS — BP 125/66 | HR 78 | Temp 98.3°F | Resp 18 | Ht 66.0 in | Wt 261.5 lb

## 2018-12-26 DIAGNOSIS — C50212 Malignant neoplasm of upper-inner quadrant of left female breast: Secondary | ICD-10-CM

## 2018-12-26 DIAGNOSIS — G4733 Obstructive sleep apnea (adult) (pediatric): Secondary | ICD-10-CM | POA: Diagnosis not present

## 2018-12-26 DIAGNOSIS — Z87828 Personal history of other (healed) physical injury and trauma: Secondary | ICD-10-CM | POA: Diagnosis not present

## 2018-12-26 DIAGNOSIS — M25531 Pain in right wrist: Secondary | ICD-10-CM | POA: Diagnosis not present

## 2018-12-26 DIAGNOSIS — Z170 Estrogen receptor positive status [ER+]: Principal | ICD-10-CM

## 2018-12-26 DIAGNOSIS — E1122 Type 2 diabetes mellitus with diabetic chronic kidney disease: Secondary | ICD-10-CM | POA: Insufficient documentation

## 2018-12-26 DIAGNOSIS — Z803 Family history of malignant neoplasm of breast: Secondary | ICD-10-CM | POA: Diagnosis not present

## 2018-12-26 DIAGNOSIS — I129 Hypertensive chronic kidney disease with stage 1 through stage 4 chronic kidney disease, or unspecified chronic kidney disease: Secondary | ICD-10-CM | POA: Insufficient documentation

## 2018-12-26 DIAGNOSIS — R202 Paresthesia of skin: Secondary | ICD-10-CM | POA: Diagnosis not present

## 2018-12-26 DIAGNOSIS — Z79899 Other long term (current) drug therapy: Secondary | ICD-10-CM | POA: Insufficient documentation

## 2018-12-26 DIAGNOSIS — Z7951 Long term (current) use of inhaled steroids: Secondary | ICD-10-CM | POA: Insufficient documentation

## 2018-12-26 DIAGNOSIS — N183 Chronic kidney disease, stage 3 (moderate): Secondary | ICD-10-CM | POA: Diagnosis not present

## 2018-12-26 DIAGNOSIS — Z90710 Acquired absence of both cervix and uterus: Secondary | ICD-10-CM | POA: Diagnosis not present

## 2018-12-26 DIAGNOSIS — F17210 Nicotine dependence, cigarettes, uncomplicated: Secondary | ICD-10-CM | POA: Insufficient documentation

## 2018-12-26 DIAGNOSIS — Z9989 Dependence on other enabling machines and devices: Secondary | ICD-10-CM | POA: Insufficient documentation

## 2018-12-26 DIAGNOSIS — Z7984 Long term (current) use of oral hypoglycemic drugs: Secondary | ICD-10-CM | POA: Diagnosis not present

## 2018-12-26 LAB — CBC WITH DIFFERENTIAL (CANCER CENTER ONLY)
Abs Immature Granulocytes: 0.03 10*3/uL (ref 0.00–0.07)
Basophils Absolute: 0 10*3/uL (ref 0.0–0.1)
Basophils Relative: 0 %
Eosinophils Absolute: 0.4 10*3/uL (ref 0.0–0.5)
Eosinophils Relative: 4 %
HCT: 43.1 % (ref 36.0–46.0)
Hemoglobin: 12.7 g/dL (ref 12.0–15.0)
Immature Granulocytes: 0 %
Lymphocytes Relative: 27 %
Lymphs Abs: 2.5 10*3/uL (ref 0.7–4.0)
MCH: 28.2 pg (ref 26.0–34.0)
MCHC: 29.5 g/dL — ABNORMAL LOW (ref 30.0–36.0)
MCV: 95.6 fL (ref 80.0–100.0)
Monocytes Absolute: 0.5 10*3/uL (ref 0.1–1.0)
Monocytes Relative: 6 %
Neutro Abs: 5.9 10*3/uL (ref 1.7–7.7)
Neutrophils Relative %: 63 %
Platelet Count: 334 10*3/uL (ref 150–400)
RBC: 4.51 MIL/uL (ref 3.87–5.11)
RDW: 15.2 % (ref 11.5–15.5)
WBC Count: 9.3 10*3/uL (ref 4.0–10.5)
nRBC: 0 % (ref 0.0–0.2)

## 2018-12-26 LAB — CMP (CANCER CENTER ONLY)
ALT: 9 U/L (ref 0–44)
AST: 11 U/L — ABNORMAL LOW (ref 15–41)
Albumin: 3.8 g/dL (ref 3.5–5.0)
Alkaline Phosphatase: 90 U/L (ref 38–126)
Anion gap: 7 (ref 5–15)
BUN: 10 mg/dL (ref 6–20)
CO2: 27 mmol/L (ref 22–32)
Calcium: 9.3 mg/dL (ref 8.9–10.3)
Chloride: 109 mmol/L (ref 98–111)
Creatinine: 1.27 mg/dL — ABNORMAL HIGH (ref 0.44–1.00)
GFR, Est AFR Am: 54 mL/min — ABNORMAL LOW (ref >60)
GFR, Estimated: 46 mL/min — ABNORMAL LOW (ref >60)
Glucose, Bld: 123 mg/dL — ABNORMAL HIGH (ref 70–99)
Potassium: 3.7 mmol/L (ref 3.5–5.1)
Sodium: 143 mmol/L (ref 135–145)
Total Bilirubin: 0.8 mg/dL (ref 0.3–1.2)
Total Protein: 7.1 g/dL (ref 6.5–8.1)

## 2018-12-26 NOTE — Telephone Encounter (Signed)
Nutrition  Patient with new diagnosis of breast cancer and was seen in [facility] today.  RD not present due to COVID-19 pandemic.  Nutrition packet was given to patient.  Called patient to introduce self and service at [facility].  Discussed briefly with patient nutrition tips and suggestions. Patient appreciative of call. Patient has RD contact information if needed in the future.  Kameelah Minish B. Zenia Resides, [place], [place] Registered Dietitian [phone] (pager)

## 2018-12-26 NOTE — Telephone Encounter (Signed)
Pt presents for appts today with oral temp 100.5, retaken oral temp 100.1.  Pt denies any Covid-19 symptoms or exposure or recent travel.  Pt has no other risks besides obesity.  Spoke to [place], pt cleared for appts today.  Wearing a mask at all times reinforced, pt VU of instructions.

## 2018-12-26 NOTE — Telephone Encounter (Signed)
No los per 5/6.

## 2018-12-27 ENCOUNTER — Other Ambulatory Visit: Payer: Self-pay | Admitting: General Surgery

## 2018-12-27 ENCOUNTER — Telehealth: Payer: Self-pay | Admitting: *Deleted

## 2018-12-27 DIAGNOSIS — Z170 Estrogen receptor positive status [ER+]: Principal | ICD-10-CM

## 2018-12-27 DIAGNOSIS — C50212 Malignant neoplasm of upper-inner quadrant of left female breast: Secondary | ICD-10-CM

## 2018-12-27 NOTE — Telephone Encounter (Signed)
Spoke to pt concerning [place] from 5.6.20. Denies questions or concerns regarding dx or treatment care plan. Confirmed sx date. Discussed next steps. Encourage pt to call with needs. Received verbal understanding. Contact information provided.

## 2018-12-31 ENCOUNTER — Other Ambulatory Visit: Payer: Self-pay

## 2018-12-31 ENCOUNTER — Encounter (HOSPITAL_BASED_OUTPATIENT_CLINIC_OR_DEPARTMENT_OTHER): Payer: Self-pay | Admitting: *Deleted

## 2019-01-03 ENCOUNTER — Other Ambulatory Visit: Payer: Self-pay

## 2019-01-03 ENCOUNTER — Inpatient Hospital Stay: Admission: RE | Admit: 2019-01-03 | Payer: Medicare HMO | Source: Ambulatory Visit

## 2019-01-03 ENCOUNTER — Other Ambulatory Visit (HOSPITAL_COMMUNITY)
Admission: RE | Admit: 2019-01-03 | Discharge: 2019-01-03 | Disposition: A | Discharge status: Home / Self Care | Payer: Medicare HMO | Source: Ambulatory Visit | Attending: General Surgery | Admitting: General Surgery

## 2019-01-03 ENCOUNTER — Encounter (HOSPITAL_BASED_OUTPATIENT_CLINIC_OR_DEPARTMENT_OTHER)
Admission: RE | Admit: 2019-01-03 | Discharge: 2019-01-03 | Disposition: A | Discharge status: Home / Self Care | Payer: Medicare HMO | Source: Ambulatory Visit | Attending: General Surgery | Admitting: General Surgery

## 2019-01-03 DIAGNOSIS — Z1159 Encounter for screening for other viral diseases: Secondary | ICD-10-CM | POA: Diagnosis present

## 2019-01-03 DIAGNOSIS — C50912 Malignant neoplasm of unspecified site of left female breast: Secondary | ICD-10-CM | POA: Insufficient documentation

## 2019-01-03 NOTE — Progress Notes (Signed)
Pt given Ensure and instructed to drink by 0900 day of surgery with teach back method.

## 2019-01-04 ENCOUNTER — Encounter: Payer: Self-pay | Admitting: General Practice

## 2019-01-04 LAB — NOVEL CORONAVIRUS, NAA (HOSP ORDER, SEND-OUT TO REF LAB; TAT 18-24 HRS): SARS-CoV-2, NAA: NOT DETECTED

## 2019-01-04 NOTE — Progress Notes (Signed)
[place] Psychosocial Distress Screening [place] by phone following Breast Multidisciplinary Clinic to introduce [place] team/resources, reviewing distress screen per protocol.  The patient scored a 3 on the Psychosocial Distress Thermometer which indicates mild distress. Also assessed for distress and other psychosocial needs.   ONCBCN DISTRESS SCREENING 01/04/2019  Screening Type Initial Screening  Distress experienced in past week (1-10) 3  Emotional problem type Adjusting to illness  Spiritual/Religous concerns type Relating to God  Referral to support programs Yes   Emma Casey is eager for her procedure so that she "can get on the road to recovery," which will reduce her distress. She reports support from family a couple of friends, including one who lives nearby.  Follow up needed: No. Ensured that Emma Casey is aware of [facility] peer mentoring, [employer], and ongoing Team availability, including how to contact chaplain as needed/desired. Per pt, no other needs at this time.   [place], [place], [facility] Pager [phone] Voicemail [phone]

## 2019-01-04 NOTE — Progress Notes (Signed)
EKG reviewed by Dr. Turk, will proceed with surgery as scheduled. 

## 2019-01-06 NOTE — H&P (Signed)
Emma Casey Location: [facility] Patient #: [id number] DOB: Jul 30, 1961 Undefined / Language: Emma Casey / Race: Black or African American Female       History of Present Illness      This is a 58 year old female. Seen in the [facility] recently by Emma Casey, Emma Casey, and me. She was referred by Emma Casey at the [facility] for evaluation and management of invasive cancer left breast. Her PCP is Emma Casey. Emma Pert, RN, navigator served as my chaperone for the encounter.      She lives in [place] and lives alone. She has no prior history of breast disease. Last mammogram 2 years ago. Recent mammogram shows a small density in the left breast. Not well seen by ultrasound. Architectural distortion. Medial to upper inner quadrant, posterior aspect. Right breast looked normal. Left axillary ultrasound negative. 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 ER 95%. PR 30%, HER-2 negative, Ki-67 20%      Past history reveals sleep apnea with CPAP. Non-insulin-dependent diabetes. Headaches. Wrist fractures. Bilateral tubal ligation. TAH and BSO through Pfannenstiel incision. Family history reveals father died of heart attack and TIAs. Mother died age 25 of "natural causes". A maternal aunt had breast cancer Social history reveals she is a widow since 77. Lives alone. 8 siblings. No children. Denies alcohol or tobacco. She is disabled due to wrist fracture. Lives in [place].      We had a long discussion almost 1 hour discussing surgical management and overall management. We discussed surgical options of lumpectomy and sentinel node biopsy radiation therapy and compared that to mastectomy with or without reconstruction. She knows these are her options. I did not feel there was any survival advantage to mastectomy and I told her that. She would like to go ahead with lumpectomy and sentinel node biopsy.  I  think she is a good  candidate for that. I discussed the indications, details, techniques, and numerous risk of the surgery with her. She's aware of the risks of bleeding, infection, cosmetic deformity, reoperation for positive margins or multiple positive nodes, shoulder disability, chronic swelling, arm numbness. She understands all of these issues. All of her questions were answered. She agrees with this plan       She will need adjuvant radiation therapy and adjuvant antiestrogen therapy.             Medical oncology will decide whether she needs ONCOTYPE TESTING depending on the dimensions of the tumor.       Past Surgical History  Breast Biopsy  Left. Hysterectomy (not due to cancer) - Complete  Oral Surgery   Diagnostic Studies History  Colonoscopy  5-10 years ago Mammogram  within last year Pap Smear  1-5 years ago  Medication History  Medications Reconciled  Social History ( Caffeine use  Carbonated beverages, Coffee, Tea. No alcohol use  No drug use  Tobacco use  Former smoker.  Family History Arthritis  Brother. Bleeding disorder  Sister. Breast Cancer  Family Members In General. Cerebrovascular Accident  Father. Heart Disease  Father. Hypertension  Father. Kidney Disease  Father. Respiratory Condition  Brother.  Other Problems  Back Pain  Depression  Diabetes Mellitus  High blood pressure  Hypercholesterolemia  Migraine Headache     Review of Systems  General Not Present- Appetite Loss, Chills, Fatigue, Fever, Night Sweats, Weight Gain and Weight Loss. Skin Not Present- Change in Wart/Mole, Dryness, Hives, Jaundice, New Lesions, Non-Healing Wounds, Rash  and Ulcer. HEENT Present- Seasonal Allergies. Not Present- Earache, Hearing Loss, Hoarseness, Nose Bleed, Oral Ulcers, Ringing in the Ears, Sinus Pain, Sore Throat, Visual Disturbances, Wears glasses/contact lenses and Yellow Eyes. Respiratory Not Present- Bloody sputum, Chronic Cough,  Difficulty Breathing, Snoring and Wheezing. Breast Not Present- Breast Mass, Breast Pain, Nipple Discharge and Skin Changes. Cardiovascular Not Present- Chest Pain, Difficulty Breathing Lying Down, Leg Cramps, Palpitations, Rapid Heart Rate, Shortness of Breath and Swelling of Extremities. Gastrointestinal Not Present- Abdominal Pain, Bloating, Bloody Stool, Change in Bowel Habits, Chronic diarrhea, Constipation, Difficulty Swallowing, Excessive gas, Gets full quickly at meals, Hemorrhoids, Indigestion, Nausea, Rectal Pain and Vomiting. Female Genitourinary Not Present- Frequency, Nocturia, Painful Urination, Pelvic Pain and Urgency. Musculoskeletal Not Present- Back Pain, Joint Pain, Joint Stiffness, Muscle Pain, Muscle Weakness and Swelling of Extremities. Neurological Not Present- Decreased Memory, Fainting, Headaches, Numbness, Seizures, Tingling, Tremor, Trouble walking and Weakness. Psychiatric Not Present- Anxiety, Bipolar, Change in Sleep Pattern, Depression, Fearful and Frequent crying. Endocrine Not Present- Cold Intolerance, Excessive Hunger, Hair Changes, Heat Intolerance, Hot flashes and New Diabetes. Hematology Not Present- Blood Thinners, Easy Bruising, Excessive bleeding, Gland problems, HIV and Persistent Infections.   Physical Exam  General Mental Status-Alert. General Appearance-Consistent with stated age. Hydration-Well hydrated. Voice-Normal. Note: A bit overweight. BMI measurement pending   Head and Neck Head-normocephalic, atraumatic with no lesions or palpable masses. Trachea-midline. Thyroid Gland Characteristics - normal size and consistency.  Eye Eyeball - Bilateral-Extraocular movements intact. Sclera/Conjunctiva - Bilateral-No scleral icterus.  Chest and Lung Exam Chest and lung exam reveals -quiet, even and easy respiratory effort with no use of accessory muscles and on auscultation, normal breath sounds, no adventitious sounds and  normal vocal resonance. Inspection Chest Wall - Normal. Back - normal.  Breast Note: Breasts are large, somewhat ptotic. Symmetrical. Minimal bruising. No palpable mass on either side. No other skin change. No axillary adenopathy.   Cardiovascular Cardiovascular examination reveals -normal heart sounds, regular rate and rhythm with no murmurs and normal pedal pulses bilaterally.  Abdomen Inspection Inspection of the abdomen reveals - No Hernias. Skin - Scar - Note: Well-healed Pfannenstiel incision. Palpation/Percussion Palpation and Percussion of the abdomen reveal - Soft, Non Tender, No Rebound tenderness, No Rigidity (guarding) and No hepatosplenomegaly. Auscultation Auscultation of the abdomen reveals - Bowel sounds normal.  Neurologic Neurologic evaluation reveals -alert and oriented x 3 with no impairment of recent or remote memory. Mental Status-Normal.  Musculoskeletal Normal Exam - Left-Upper Extremity Strength Normal and Lower Extremity Strength Normal. Normal Exam - Right-Upper Extremity Strength Normal and Lower Extremity Strength Normal.  Lymphatic Head & Neck  General Head & Neck Lymphatics: Bilateral - Description - Normal. Axillary  General Axillary Region: Bilateral - Description - Normal. Tenderness - Non Tender. Femoral & Inguinal  Generalized Femoral & Inguinal Lymphatics: Bilateral - Description - Normal. Tenderness - Non Tender.    Assessment & Plan PRIMARY CANCER OF UPPER INNER QUADRANT OF LEFT FEMALE BREAST (C50.212)  Your recent imaging studies and biopsy show a small cancer in the left breast, medially, posterior aspect. This appears to be a solitary finding Ultrasound shows no enlargement of the lymph nodes under your arm The right breast looks normal  We have discussed your options for treatment plan We have discussed surgical options including lumpectomy, mastectomy with or without radiation therapy, sentinel lymph node  biopsy We have decided to proceed with left breast lumpectomy with radioactive seed localization and left axillary sentinel lymph node biopsy We discussed the indications, techniques, and  risks of that surgery in detail With likely this will be followed by radiation therapy and antibiotic estrogen therapy. Decisions regarding chemotherapy will be made by Emma Casey    SLEEP APNEA WITH USE OF CONTINUOUS POSITIVE AIRWAY PRESSURE (CPAP) (G47.30) TYPE 2 DIABETES MELLITUS TREATED WITHOUT INSULIN (E11.9) HISTORY OF TOTAL ABDOMINAL HYSTERECTOMY AND BILATERAL SALPINGO-OOPHORECTOMY (Z90.710) FAMILY HISTORY OF BREAST CANCER (Z80.3) Impression: Maternal aunt   Edsel Petrin. [name], M.D., [facility] General and Minimally invasive Surgery Breast and Colorectal Surgery Office:   [phone] Pager:   [phone]

## 2019-01-07 ENCOUNTER — Ambulatory Visit
Admission: RE | Admit: 2019-01-07 | Discharge: 2019-01-07 | Disposition: A | Discharge status: Home / Self Care | Payer: Medicare HMO | Source: Ambulatory Visit | Attending: General Surgery | Admitting: General Surgery

## 2019-01-07 ENCOUNTER — Other Ambulatory Visit: Payer: Self-pay

## 2019-01-07 ENCOUNTER — Ambulatory Visit (HOSPITAL_COMMUNITY): Payer: Medicare HMO

## 2019-01-07 DIAGNOSIS — Z170 Estrogen receptor positive status [ER+]: Secondary | ICD-10-CM

## 2019-01-07 DIAGNOSIS — C50212 Malignant neoplasm of upper-inner quadrant of left female breast: Secondary | ICD-10-CM

## 2019-01-08 ENCOUNTER — Encounter (HOSPITAL_BASED_OUTPATIENT_CLINIC_OR_DEPARTMENT_OTHER): Payer: Self-pay | Admitting: Emergency Medicine

## 2019-01-08 ENCOUNTER — Ambulatory Visit (HOSPITAL_COMMUNITY)
Admission: RE | Admit: 2019-01-08 | Discharge: 2019-01-08 | Disposition: A | Discharge status: Home / Self Care | Payer: Medicare HMO | Source: Ambulatory Visit | Attending: General Surgery | Admitting: General Surgery

## 2019-01-08 ENCOUNTER — Other Ambulatory Visit: Payer: Self-pay

## 2019-01-08 ENCOUNTER — Encounter (HOSPITAL_BASED_OUTPATIENT_CLINIC_OR_DEPARTMENT_OTHER)
Admission: RE | Disposition: A | Discharge status: Home / Self Care | Payer: Self-pay | Source: Home / Self Care | Attending: General Surgery

## 2019-01-08 ENCOUNTER — Ambulatory Visit
Admission: RE | Admit: 2019-01-08 | Discharge: 2019-01-08 | Disposition: A | Discharge status: Home / Self Care | Payer: Medicare HMO | Source: Ambulatory Visit | Attending: General Surgery | Admitting: General Surgery

## 2019-01-08 ENCOUNTER — Ambulatory Visit (HOSPITAL_BASED_OUTPATIENT_CLINIC_OR_DEPARTMENT_OTHER): Payer: Medicare HMO | Admitting: Anesthesiology

## 2019-01-08 ENCOUNTER — Ambulatory Visit (HOSPITAL_BASED_OUTPATIENT_CLINIC_OR_DEPARTMENT_OTHER)
Admission: RE | Admit: 2019-01-08 | Discharge: 2019-01-08 | Disposition: A | Discharge status: Home / Self Care | Payer: Medicare HMO | Attending: General Surgery | Admitting: General Surgery

## 2019-01-08 DIAGNOSIS — Z8249 Family history of ischemic heart disease and other diseases of the circulatory system: Secondary | ICD-10-CM | POA: Insufficient documentation

## 2019-01-08 DIAGNOSIS — I10 Essential (primary) hypertension: Secondary | ICD-10-CM | POA: Insufficient documentation

## 2019-01-08 DIAGNOSIS — E119 Type 2 diabetes mellitus without complications: Secondary | ICD-10-CM | POA: Insufficient documentation

## 2019-01-08 DIAGNOSIS — Z170 Estrogen receptor positive status [ER+]: Secondary | ICD-10-CM | POA: Diagnosis not present

## 2019-01-08 DIAGNOSIS — C50212 Malignant neoplasm of upper-inner quadrant of left female breast: Secondary | ICD-10-CM | POA: Diagnosis not present

## 2019-01-08 DIAGNOSIS — Z6841 Body Mass Index (BMI) 40.0 and over, adult: Secondary | ICD-10-CM | POA: Insufficient documentation

## 2019-01-08 DIAGNOSIS — G4730 Sleep apnea, unspecified: Secondary | ICD-10-CM | POA: Diagnosis not present

## 2019-01-08 DIAGNOSIS — Z90710 Acquired absence of both cervix and uterus: Secondary | ICD-10-CM | POA: Diagnosis not present

## 2019-01-08 DIAGNOSIS — Z803 Family history of malignant neoplasm of breast: Secondary | ICD-10-CM | POA: Diagnosis not present

## 2019-01-08 HISTORY — DX: Anxiety disorder, unspecified: F41.9

## 2019-01-08 HISTORY — DX: Depression, unspecified: F32.A

## 2019-01-08 HISTORY — DX: Gastro-esophageal reflux disease without esophagitis: K21.9

## 2019-01-08 HISTORY — PX: BREAST LUMPECTOMY WITH RADIOACTIVE SEED AND SENTINEL LYMPH NODE BIOPSY: SHX6550

## 2019-01-08 LAB — GLUCOSE, CAPILLARY
Glucose-Capillary: 158 mg/dL — ABNORMAL HIGH (ref 70–99)
Glucose-Capillary: 131 mg/dL — ABNORMAL HIGH (ref 70–99)

## 2019-01-08 SURGERY — BREAST LUMPECTOMY WITH RADIOACTIVE SEED AND SENTINEL LYMPH NODE BIOPSY
Anesthesia: General | Site: Breast | Laterality: Left

## 2019-01-08 MED ORDER — PHENYLEPHRINE 40 MCG/ML (10ML) SYRINGE FOR IV PUSH (FOR BLOOD PRESSURE SUPPORT)
PREFILLED_SYRINGE | INTRAVENOUS | Status: AC
  Filled 2019-01-08: qty 10

## 2019-01-08 MED ORDER — ACETAMINOPHEN 160 MG/5ML PO SOLN: 325.0000 mg | ORAL | Status: DC | PRN

## 2019-01-08 MED ORDER — CHLORHEXIDINE GLUCONATE CLOTH 2 % EX PADS: 6.0000 | MEDICATED_PAD | Freq: Once | CUTANEOUS | Status: DC

## 2019-01-08 MED ORDER — ROCURONIUM BROMIDE 100 MG/10ML IV SOLN
INTRAVENOUS | Status: DC | PRN
  Administered 2019-01-08: 40 mg via INTRAVENOUS

## 2019-01-08 MED ORDER — OXYCODONE HCL 5 MG PO TABS
5.0000 mg | ORAL_TABLET | Freq: Once | ORAL | Status: AC | PRN
  Administered 2019-01-08: 5 mg via ORAL

## 2019-01-08 MED ORDER — SCOPOLAMINE 1 MG/3DAYS TD PT72: 1.0000 | MEDICATED_PATCH | Freq: Once | TRANSDERMAL | Status: DC | PRN

## 2019-01-08 MED ORDER — MEPERIDINE HCL 25 MG/ML IJ SOLN: 6.2500 mg | INTRAMUSCULAR | Status: DC | PRN

## 2019-01-08 MED ORDER — GABAPENTIN 300 MG PO CAPS
ORAL_CAPSULE | ORAL | Status: AC
  Filled 2019-01-08: qty 1

## 2019-01-08 MED ORDER — PROPOFOL 10 MG/ML IV BOLUS
INTRAVENOUS | Status: DC | PRN
  Administered 2019-01-08: 150 mg via INTRAVENOUS
  Administered 2019-01-08: 100 mg via INTRAVENOUS

## 2019-01-08 MED ORDER — ACETAMINOPHEN 500 MG PO TABS
ORAL_TABLET | ORAL | Status: AC
  Filled 2019-01-08: qty 2

## 2019-01-08 MED ORDER — MIDAZOLAM HCL 2 MG/2ML IJ SOLN
INTRAMUSCULAR | Status: AC
  Filled 2019-01-08: qty 2

## 2019-01-08 MED ORDER — ROCURONIUM BROMIDE 10 MG/ML (PF) SYRINGE
PREFILLED_SYRINGE | INTRAVENOUS | Status: AC
  Filled 2019-01-08: qty 10

## 2019-01-08 MED ORDER — LACTATED RINGERS IV SOLN
INTRAVENOUS | Status: DC
  Administered 2019-01-08 (×2): via INTRAVENOUS

## 2019-01-08 MED ORDER — LIDOCAINE HCL (CARDIAC) PF 100 MG/5ML IV SOSY
PREFILLED_SYRINGE | INTRAVENOUS | Status: DC | PRN
  Administered 2019-01-08: 75 mg via INTRAVENOUS

## 2019-01-08 MED ORDER — BUPIVACAINE LIPOSOME 1.3 % IJ SUSP
INTRAMUSCULAR | Status: DC | PRN
  Administered 2019-01-08: 10 mL

## 2019-01-08 MED ORDER — MIDAZOLAM HCL 2 MG/2ML IJ SOLN
1.0000 mg | INTRAMUSCULAR | Status: DC | PRN
  Administered 2019-01-08: 2 mg via INTRAVENOUS

## 2019-01-08 MED ORDER — SODIUM CHLORIDE (PF) 0.9 % IJ SOLN
INTRAMUSCULAR | Status: AC
  Filled 2019-01-08: qty 10

## 2019-01-08 MED ORDER — PHENYLEPHRINE HCL (PRESSORS) 10 MG/ML IV SOLN
INTRAVENOUS | Status: AC
  Filled 2019-01-08: qty 1

## 2019-01-08 MED ORDER — FENTANYL CITRATE (PF) 100 MCG/2ML IJ SOLN
50.0000 ug | INTRAMUSCULAR | Status: DC | PRN
  Administered 2019-01-08: 100 ug via INTRAVENOUS

## 2019-01-08 MED ORDER — SUCCINYLCHOLINE CHLORIDE 20 MG/ML IJ SOLN
INTRAMUSCULAR | Status: DC | PRN
  Administered 2019-01-08: 100 mg via INTRAVENOUS

## 2019-01-08 MED ORDER — DEXAMETHASONE SODIUM PHOSPHATE 10 MG/ML IJ SOLN
INTRAMUSCULAR | Status: AC
  Filled 2019-01-08: qty 1

## 2019-01-08 MED ORDER — FENTANYL CITRATE (PF) 100 MCG/2ML IJ SOLN
INTRAMUSCULAR | Status: AC
  Filled 2019-01-08: qty 2

## 2019-01-08 MED ORDER — BUPIVACAINE-EPINEPHRINE (PF) 0.5% -1:200000 IJ SOLN
INTRAMUSCULAR | Status: DC | PRN
  Administered 2019-01-08: 5 mL

## 2019-01-08 MED ORDER — EPHEDRINE SULFATE 50 MG/ML IJ SOLN
INTRAMUSCULAR | Status: DC | PRN
  Administered 2019-01-08: 15 mg via INTRAVENOUS

## 2019-01-08 MED ORDER — SUGAMMADEX SODIUM 500 MG/5ML IV SOLN
INTRAVENOUS | Status: AC
  Filled 2019-01-08: qty 5

## 2019-01-08 MED ORDER — METHYLENE BLUE 0.5 % INJ SOLN
INTRAVENOUS | Status: AC
  Filled 2019-01-08: qty 10

## 2019-01-08 MED ORDER — BUPIVACAINE-EPINEPHRINE (PF) 0.5% -1:200000 IJ SOLN
INTRAMUSCULAR | Status: AC
  Filled 2019-01-08: qty 30

## 2019-01-08 MED ORDER — GABAPENTIN 300 MG PO CAPS
300.0000 mg | ORAL_CAPSULE | ORAL | Status: AC
  Administered 2019-01-08: 300 mg via ORAL

## 2019-01-08 MED ORDER — SODIUM CHLORIDE 0.9% FLUSH: 3.0000 mL | Freq: Two times a day (BID) | INTRAVENOUS | Status: DC

## 2019-01-08 MED ORDER — EPHEDRINE 5 MG/ML INJ
INTRAVENOUS | Status: AC
  Filled 2019-01-08: qty 10

## 2019-01-08 MED ORDER — ACETAMINOPHEN 325 MG PO TABS: 325.0000 mg | ORAL_TABLET | ORAL | Status: DC | PRN

## 2019-01-08 MED ORDER — OXYCODONE HCL 5 MG/5ML PO SOLN: 5.0000 mg | Freq: Once | ORAL | Status: AC | PRN

## 2019-01-08 MED ORDER — OXYCODONE HCL 5 MG PO TABS
ORAL_TABLET | ORAL | Status: AC
  Filled 2019-01-08: qty 1

## 2019-01-08 MED ORDER — ACETAMINOPHEN 500 MG PO TABS
1000.0000 mg | ORAL_TABLET | ORAL | Status: AC
  Administered 2019-01-08: 11:00:00 1000 mg via ORAL

## 2019-01-08 MED ORDER — FENTANYL CITRATE (PF) 100 MCG/2ML IJ SOLN: 25.0000 ug | INTRAMUSCULAR | Status: DC | PRN

## 2019-01-08 MED ORDER — SUGAMMADEX SODIUM 500 MG/5ML IV SOLN
INTRAVENOUS | Status: DC | PRN
  Administered 2019-01-08: 250 mg via INTRAVENOUS

## 2019-01-08 MED ORDER — ONDANSETRON HCL 4 MG/2ML IJ SOLN: 4.0000 mg | Freq: Once | INTRAMUSCULAR | Status: DC | PRN

## 2019-01-08 MED ORDER — BUPIVACAINE-EPINEPHRINE 0.5% -1:200000 IJ SOLN
INTRAMUSCULAR | Status: DC | PRN
  Administered 2019-01-08 (×2): 10 mL

## 2019-01-08 MED ORDER — SODIUM CHLORIDE (PF) 0.9 % IJ SOLN
INTRAVENOUS | Status: DC | PRN
  Administered 2019-01-08: 5 mL

## 2019-01-08 MED ORDER — CEFAZOLIN SODIUM-DEXTROSE 2-4 GM/100ML-% IV SOLN
INTRAVENOUS | Status: AC
  Filled 2019-01-08: qty 100

## 2019-01-08 MED ORDER — LIDOCAINE 2% (20 MG/ML) 5 ML SYRINGE
INTRAMUSCULAR | Status: AC
  Filled 2019-01-08: qty 5

## 2019-01-08 MED ORDER — SUCCINYLCHOLINE CHLORIDE 200 MG/10ML IV SOSY
PREFILLED_SYRINGE | INTRAVENOUS | Status: AC
  Filled 2019-01-08: qty 10

## 2019-01-08 MED ORDER — DEXAMETHASONE SODIUM PHOSPHATE 4 MG/ML IJ SOLN
INTRAMUSCULAR | Status: DC | PRN
  Administered 2019-01-08: 4 mg via INTRAVENOUS

## 2019-01-08 MED ORDER — HYDROCODONE-ACETAMINOPHEN 5-325 MG PO TABS: 1.0000 | ORAL_TABLET | Freq: Four times a day (QID) | ORAL | 0 refills | Status: DC | PRN

## 2019-01-08 MED ORDER — CEFAZOLIN SODIUM-DEXTROSE 2-4 GM/100ML-% IV SOLN
2.0000 g | INTRAVENOUS | Status: AC
  Administered 2019-01-08 (×2): 2 g via INTRAVENOUS

## 2019-01-08 MED ORDER — TECHNETIUM TC 99M SULFUR COLLOID FILTERED
1.0000 | Freq: Once | INTRAVENOUS | Status: AC | PRN
  Administered 2019-01-08: 1 via INTRADERMAL

## 2019-01-08 MED ORDER — ONDANSETRON HCL 4 MG/2ML IJ SOLN
INTRAMUSCULAR | Status: AC
  Filled 2019-01-08: qty 2

## 2019-01-08 SURGICAL SUPPLY — 63 items
APL PRP STRL LF DISP 70% ISPRP (MISCELLANEOUS) ×1
APPLIER CLIP 11 MED OPEN (CLIP) ×2
BANDAGE ACE 6X5 VEL STRL LF (GAUZE/BANDAGES/DRESSINGS) IMPLANT
BINDER BREAST 3XL (GAUZE/BANDAGES/DRESSINGS) ×1 IMPLANT
BINDER BREAST LRG (GAUZE/BANDAGES/DRESSINGS) IMPLANT
BINDER BREAST MEDIUM (GAUZE/BANDAGES/DRESSINGS) IMPLANT
BINDER BREAST XLRG (GAUZE/BANDAGES/DRESSINGS) IMPLANT
BINDER BREAST XXLRG (GAUZE/BANDAGES/DRESSINGS) IMPLANT
BLADE HEX COATED 2.75 (ELECTRODE) ×2 IMPLANT
BLADE SURG 15 STRL LF DISP TIS (BLADE) ×2 IMPLANT
BLADE SURG 15 STRL SS (BLADE) ×2
CANISTER SUCT 1200ML W/VALVE (MISCELLANEOUS) ×2 IMPLANT
CHLORAPREP W/TINT 26 (MISCELLANEOUS) ×2 IMPLANT
CLIP APPLIE 11 MED OPEN (CLIP) ×1 IMPLANT
COVER BACK TABLE REUSABLE LG (DRAPES) ×2 IMPLANT
COVER MAYO STAND REUSABLE (DRAPES) ×2 IMPLANT
COVER PROBE W GEL 5X96 (DRAPES) ×2 IMPLANT
COVER WAND RF STERILE (DRAPES) IMPLANT
DECANTER SPIKE VIAL GLASS SM (MISCELLANEOUS) IMPLANT
DERMABOND ADVANCED (GAUZE/BANDAGES/DRESSINGS) ×2
DERMABOND ADVANCED .7 DNX12 (GAUZE/BANDAGES/DRESSINGS) IMPLANT
DRAPE LAPAROSCOPIC ABDOMINAL (DRAPES) ×2 IMPLANT
DRAPE UTILITY XL STRL (DRAPES) ×2 IMPLANT
DRSG PAD ABDOMINAL 8X10 ST (GAUZE/BANDAGES/DRESSINGS) ×3 IMPLANT
ELECT REM PT RETURN 9FT ADLT (ELECTROSURGICAL) ×2
ELECTRODE REM PT RTRN 9FT ADLT (ELECTROSURGICAL) ×1 IMPLANT
GAUZE SPONGE 4X4 12PLY STRL (GAUZE/BANDAGES/DRESSINGS) ×2 IMPLANT
GAUZE SPONGE 4X4 12PLY STRL LF (GAUZE/BANDAGES/DRESSINGS) IMPLANT
GLOVE BIOGEL PI IND STRL 6.5 (GLOVE) IMPLANT
GLOVE BIOGEL PI INDICATOR 6.5 (GLOVE) ×1
GLOVE ECLIPSE 6.5 STRL STRAW (GLOVE) ×1 IMPLANT
GLOVE EUDERMIC 7 POWDERFREE (GLOVE) ×2 IMPLANT
GOWN STRL REUS W/ TWL LRG LVL3 (GOWN DISPOSABLE) ×1 IMPLANT
GOWN STRL REUS W/ TWL XL LVL3 (GOWN DISPOSABLE) ×1 IMPLANT
GOWN STRL REUS W/TWL LRG LVL3 (GOWN DISPOSABLE) ×1
GOWN STRL REUS W/TWL XL LVL3 (GOWN DISPOSABLE) ×1
ILLUMINATOR WAVEGUIDE N/F (MISCELLANEOUS) IMPLANT
KIT MARKER MARGIN INK (KITS) ×2 IMPLANT
LIGHT WAVEGUIDE WIDE FLAT (MISCELLANEOUS) IMPLANT
NDL HYPO 25X1 1.5 SAFETY (NEEDLE) ×2 IMPLANT
NDL SAFETY ECLIPSE 18X1.5 (NEEDLE) ×1 IMPLANT
NEEDLE HYPO 18GX1.5 SHARP (NEEDLE)
NEEDLE HYPO 25X1 1.5 SAFETY (NEEDLE) ×4 IMPLANT
NS IRRIG 1000ML POUR BTL (IV SOLUTION) ×2 IMPLANT
PACK BASIN DAY SURGERY FS (CUSTOM PROCEDURE TRAY) ×2 IMPLANT
PAD ALCOHOL SWAB (MISCELLANEOUS) ×2 IMPLANT
PENCIL BUTTON HOLSTER BLD 10FT (ELECTRODE) ×2 IMPLANT
SHEET MEDIUM DRAPE 40X70 STRL (DRAPES) ×2 IMPLANT
SLEEVE SCD COMPRESS KNEE MED (MISCELLANEOUS) ×2 IMPLANT
SPONGE LAP 18X18 RF (DISPOSABLE) IMPLANT
SPONGE LAP 4X18 RFD (DISPOSABLE) ×2 IMPLANT
SUT MNCRL AB 4-0 PS2 18 (SUTURE) ×4 IMPLANT
SUT SILK 2 0 SH (SUTURE) ×2 IMPLANT
SUT VIC AB 2-0 CT1 27 (SUTURE)
SUT VIC AB 2-0 CT1 TAPERPNT 27 (SUTURE) IMPLANT
SUT VIC AB 3-0 SH 27 (SUTURE)
SUT VIC AB 3-0 SH 27X BRD (SUTURE) IMPLANT
SUT VICRYL 3-0 CR8 SH (SUTURE) ×3 IMPLANT
SYR 10ML LL (SYRINGE) ×4 IMPLANT
TOWEL GREEN STERILE FF (TOWEL DISPOSABLE) ×4 IMPLANT
TRAY FAXITRON CT DISP (TRAY / TRAY PROCEDURE) ×2 IMPLANT
TUBE CONNECTING 20X1/4 (TUBING) ×2 IMPLANT
YANKAUER SUCT BULB TIP NO VENT (SUCTIONS) ×3 IMPLANT

## 2019-01-08 NOTE — Progress Notes (Signed)
Assisted Dr. Oddono with left, ultrasound guided, pectoralis block. Side rails up, monitors on throughout procedure. See vital signs in flow sheet. Tolerated Procedure well. °

## 2019-01-08 NOTE — Progress Notes (Signed)
At bedside with Nuclear Medicine technician [phone] for left sentinel   lymph node injection.

## 2019-01-08 NOTE — Interval H&P Note (Signed)
History and Physical Interval Note:  01/08/2019 11:48 AM  Emma Casey  has presented today for surgery, with the diagnosis of LEFT BREAST CANCER.  The various methods of treatment have been discussed with the patient and family. After consideration of risks, benefits and other options for treatment, the patient has consented to  Procedure(s): LEFT BREAST LUMPECTOMY WITH RADIOACTIVE SEED AND LEFT AXILLARY DEEP SENTINEL LYMPH NODE BIOPSY WITH BLUE DYE INJECTION (Left) as a surgical intervention.  The patient's history has been reviewed, patient examined, no change in status, stable for surgery.  I have reviewed the patient's chart and labs.  Questions were answered to the patient's satisfaction.     Adin Hector

## 2019-01-08 NOTE — Anesthesia Procedure Notes (Addendum)
Anesthesia Regional Block: Pectoralis block   Pre-Anesthetic Checklist: ,, timeout performed, Correct Patient, Correct Site, Correct Laterality, Correct Procedure, Correct Position, site marked, Risks and benefits discussed,  Surgical consent,  Pre-op evaluation,  At surgeon's request and post-op pain management  Laterality: Left  Prep: chloraprep       Needles:  Injection technique: Single-shot  Needle Type: Echogenic Stimulator Needle     Needle Length: 5cm  Needle Gauge: 22     Additional Needles:   Procedures:, nerve stimulator,,, ultrasound used (permanent image in chart),,,,  Narrative:  Start time: 01/08/2019 11:40 AM End time: 01/08/2019 11:44 AM Injection made incrementally with aspirations every 5 mL.  Performed by: Personally  Anesthesiologist: Janeece Riggers, MD  Additional Notes: Functioning IV was confirmed and monitors were applied.  A 61mm 22ga Arrow echogenic stimulator needle was used. Sterile prep and drape,hand hygiene and sterile gloves were used. Ultrasound guidance: relevant anatomy identified, needle position confirmed, local anesthetic spread visualized around nerve(s)., vascular puncture avoided.  Image printed for medical record. Negative aspiration and negative test dose prior to incremental administration of local anesthetic. The patient tolerated the procedure well.

## 2019-01-08 NOTE — Op Note (Signed)
Patient Name:           Emma Casey   Date of Surgery:        01/08/2019  Pre op Diagnosis:      Invasive ductal carcinoma left breast, upper inner quadrant, estrogen receptor positive  Post op Diagnosis:    Same  Procedure:                 Inject blue dye left breast                                      Left breast lumpectomy with radioactive seed localization                                      Reexcision medial margin                                      Reexcision posterior margin                                      Left axillary deep sentinel lymph node biopsy  Surgeon:                     Edsel Petrin. [name], M.D., FACS  Assistant:                      OR staff  Operative Indications:        This is a 58 year old female. Seen in the [facility] recently by Dr. Burr Medico, Dr. Lisbeth Renshaw, and me. She was referred by Curlene Dolphin at the [facility] for evaluation and management of invasive cancer left breast. Her PCP is Varney Biles.     She has no prior history of breast disease. Last mammogram 2 years ago. Recent mammogram shows a small density in the left breast. Not well seen by ultrasound. Architectural distortion. Medial to upper inner quadrant, posterior aspect. Right breast looked normal. Left axillary ultrasound negative. 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 ER 95%. PR 30%, HER-2 negative, Ki-67 20%      Past history reveals sleep apnea with CPAP. Non-insulin-dependent diabetes. Headaches. Wrist fractures. Bilateral tubal ligation. TAH and BSO through Pfannenstiel incision. Family history . A maternal aunt had breast cancer.      We had a long discussion almost 1 hour discussing surgical management and overall management.  She would like to go ahead with lumpectomy and sentinel node biopsy.  I  think she is a good candidate for that.      She will need adjuvant radiation therapy and adjuvant antiestrogen therapy.        Medical oncology will decide whether she needs ONCOTYPE TESTING depending on the dimensions of the tumor.  Operative Findings:       Her breast is very large.  The cancer was identified in the left breast, deep in the upper inner quadrant.  The specimen mammogram looked good containing the original marker clip and the seed.  I thought that I was close medially and posteriorly so I took additional medial and posterior margins as separate specimens.  In the left axilla I found 3 sentinel lymph nodes.  These were not pathologically enlarged.  Procedure in Detail:          Following the induction of general endotracheal anesthesia a surgical timeout was performed.  Intravenous antibiotics were given.  Following alcohol prep I injected 5 cc of dilute methylene blue into the left breast, subareolar area, and massaged the breast for a few minutes.  The entire left chest wall and axilla were then prepped and draped in a sterile fashion.  0.5% Marcaine with epinephrine was used as a local infiltration anesthetic.      Using the neoprobe I identified the radioactive seed in the upper inner left breast.  A curvilinear incision was planned and then made with a knife.  The lumpectomy was performed using electrocautery and the neoprobe.  I thought that I was close medially and posteriorly so I took additional margins medially and posteriorly.  The specimen mammogram actually looks pretty good with this marker clip in the seed within the specimen toward the medial edge.  The lumpectomy incision was irrigated.  Hemostasis was excellent.  5 metal marker clips were placed in the walls of the lumpectomy cavity.  The lumpectomy cavity was closed in layers with interrupted 3-0 Vicryl sutures and the skin closed with a running subcuticular 4-0 Monocryl and Dermabond.      A transverse incision was made in the left axilla at the hairline.  Dissection was carried down through the clavipectoral fascia.  Using the neoprobe I dissected  out and removed 3 individual sentinel lymph nodes.  After these were removed there was no more radioactivity and no blue dye visible.  This wound was irrigated.  Hemostasis was excellent and achieved with metal clips and electrocautery.  The clavipectoral fascia was closed with interrupted sutures of 3-0 Vicryl and the skin closed with a running subcuticular 4-0 Monocryl and Dermabond.  Dry bandages and breast binder were placed.  The patient tolerated the procedure well and was taken to PACU in stable condition.  EBL 20 cc.  Counts correct.  Complications none.    Addendum: I logged onto the PMP aware website and reviewed her prescription medication history.     Edsel Petrin. [name], M.D., FACS General and Minimally Invasive Surgery Breast and Colorectal Surgery  01/08/2019 1:52 PM

## 2019-01-08 NOTE — Anesthesia Procedure Notes (Signed)
Procedure Name: Intubation Date/Time: 01/08/2019 12:46 PM Performed by: Willa Frater, CRNA Pre-anesthesia Checklist: Patient identified, Emergency Drugs available, Suction available and Patient being monitored Patient Re-evaluated:Patient Re-evaluated prior to induction Oxygen Delivery Method: Circle system utilized Preoxygenation: Pre-oxygenation with 100% oxygen Induction Type: IV induction Ventilation: Mask ventilation without difficulty Tube type: Oral Tube size: 7.0 mm Number of attempts: 1 Airway Equipment and Method: Stylet and Oral airway Placement Confirmation: ETT inserted through vocal cords under direct vision,  positive ETCO2 and breath sounds checked- equal and bilateral Secured at: 23 cm Tube secured with: Tape Dental Injury: Teeth and Oropharynx as per pre-operative assessment

## 2019-01-08 NOTE — Anesthesia Preprocedure Evaluation (Signed)
Anesthesia Evaluation  Patient identified by MRN, date of birth, ID band Patient awake    Reviewed: Allergy & Precautions, H&P , NPO status , Patient's Chart, lab work & pertinent test results, reviewed documented beta blocker date and time   Airway Mallampati: II  TM Distance: >3 FB Neck ROM: full    Dental no notable dental hx.    Pulmonary sleep apnea and Continuous Positive Airway Pressure Ventilation , Current Smoker,    Pulmonary exam normal breath sounds clear to auscultation       Cardiovascular Exercise Tolerance: Good hypertension, Pt. on medications and Pt. on home beta blockers negative cardio ROS   Rhythm:regular Rate:Normal     Neuro/Psych  Headaches, PSYCHIATRIC DISORDERS Anxiety Depression    GI/Hepatic Neg liver ROS, GERD  Medicated,  Endo/Other  diabetesMorbid obesity  Renal/GU negative Renal ROS  negative genitourinary   Musculoskeletal   Abdominal   Peds  Hematology negative hematology ROS (+)   Anesthesia Other Findings   Reproductive/Obstetrics negative OB ROS                             Anesthesia Physical Anesthesia Plan  ASA: III  Anesthesia Plan: General   Post-op Pain Management:    Induction: Intravenous  PONV Risk Score and Plan: 3 and Ondansetron, Treatment may vary due to age or medical condition and Midazolam  Airway Management Planned: Oral ETT and LMA  Additional Equipment:   Intra-op Plan:   Post-operative Plan: Extubation in OR  Informed Consent: I have reviewed the patients History and Physical, chart, labs and discussed the procedure including the risks, benefits and alternatives for the proposed anesthesia with the patient or authorized representative who has indicated his/her understanding and acceptance.     Dental Advisory Given  Plan Discussed with: CRNA, Anesthesiologist and Surgeon  Anesthesia Plan Comments: (  )         Anesthesia Quick Evaluation

## 2019-01-08 NOTE — Discharge Instructions (Signed)
[place] Office Phone Number [phone]  BREAST BIOPSY/ LUMPECTOMY: POST OP INSTRUCTIONS  Always review your discharge instruction sheet given to you by the facility where your surgery was performed.  IF YOU HAVE DISABILITY OR FAMILY LEAVE FORMS, YOU MUST BRING THEM TO THE OFFICE FOR PROCESSING.  DO NOT GIVE THEM TO YOUR DOCTOR.  1. A prescription for pain medication may be given to you upon discharge.  Take your pain medication as prescribed, if needed.  If narcotic pain medicine is not needed, then you may take acetaminophen (Tylenol) or ibuprofen (Advil) as needed. No Tylenol until 5:30pm if needed 2. Take your usually prescribed medications unless otherwise directed 3. If you need a refill on your pain medication, please contact your pharmacy.  They will contact our office to request authorization.  Prescriptions will not be filled after 5pm or on week-ends. 4. You should eat very light the first 24 hours after surgery, such as soup, crackers, pudding, etc.  Resume your normal diet the day after surgery. 5. Most patients will experience some swelling and bruising in the breast.  Ice packs and a good support bra will help.  Swelling and bruising can take several days to resolve.  6. It is common to experience some constipation if taking pain medication after surgery.  Increasing fluid intake and taking a stool softener will usually help or prevent this problem from occurring.  A mild laxative (Milk of Magnesia or Miralax) should be taken according to package directions if there are no bowel movements after 48 hours. 7. Unless discharge instructions indicate otherwise, you may remove your bandages 24-48 hours after surgery, and you may shower at that time.  You may have steri-strips (small skin tapes) in place directly over the incision.  These strips should be left on the skin for 7-10 days.  If your surgeon used skin glue on the incision, you may shower in 24 hours.  The glue  will flake off over the next 2-3 weeks.  Any sutures or staples will be removed at the office during your follow-up visit. 8. ACTIVITIES:  You may resume regular daily activities (gradually increasing) beginning the next day.  Wearing a good support bra or sports bra minimizes pain and swelling.  You may have sexual intercourse when it is comfortable. a. You may drive when you no longer are taking prescription pain medication, you can comfortably wear a seatbelt, and you can safely maneuver your car and apply brakes. b. RETURN TO WORK:  ______________________________________________________________________________________ 9. You should see your doctor in the office for a follow-up appointment approximately two weeks after your surgery.  Your doctors nurse will typically make your follow-up appointment when she calls you with your pathology report.  Expect your pathology report 2-3 business days after your surgery.  You may call to check if you do not hear from [place] after three days. 10. OTHER INSTRUCTIONS: _______________________________________________________________________________________________ _____________________________________________________________________________________________________________________________________ _____________________________________________________________________________________________________________________________________ _____________________________________________________________________________________________________________________________________  WHEN TO CALL YOUR DOCTOR: 1. Fever over 101.0 2. Nausea and/or vomiting. 3. Extreme swelling or bruising. 4. Continued bleeding from incision. 5. Increased pain, redness, or drainage from the incision.  The clinic staff is available to answer your questions during regular business hours.  Please dont hesitate to call and ask to speak to one of the nurses for clinical concerns.  If you have a medical  emergency, go to the nearest emergency room or call 911.  A surgeon from [facility] is always on call at the hospital.  For further  questions, please visit [email or web address]            Managing Your Pain After Surgery Without Opioids    Thank you for participating in our program to help patients manage their pain after surgery without opioids. This is part of our effort to provide you with the best care possible, without exposing you or your family to the risk that opioids pose.  What pain can I expect after surgery? You can expect to have some pain after surgery. This is normal. The pain is typically worse the day after surgery, and quickly begins to get better. Many studies have found that many patients are able to manage their pain after surgery with Over-the-Counter (OTC) medications such as Tylenol and Motrin. If you have a condition that does not allow you to take Tylenol or Motrin, notify your surgical team.  How will I manage my pain? The best strategy for controlling your pain after surgery is around the clock pain control with Tylenol (acetaminophen) and Motrin (ibuprofen or Advil). Alternating these medications with each other allows you to maximize your pain control. In addition to Tylenol and Motrin, you can use heating pads or ice packs on your incisions to help reduce your pain.  How will I alternate your regular strength over-the-counter pain medication? You will take a dose of pain medication every three hours. ; Start by taking 650 mg of Tylenol (2 pills of 325 mg) ; 3 hours later take 600 mg of Motrin (3 pills of 200 mg) ; 3 hours after taking the Motrin take 650 mg of Tylenol ; 3 hours after that take 600 mg of Motrin.   - 1 -  See example - if your first dose of Tylenol is at 12:00 PM   12:00 PM Tylenol 650 mg (2 pills of 325 mg)  3:00 PM Motrin 600 mg (3 pills of 200 mg)  6:00 PM Tylenol 650 mg (2 pills of 325 mg)  9:00  PM Motrin 600 mg (3 pills of 200 mg)  Continue alternating every 3 hours   We recommend that you follow this schedule around-the-clock for at least 3 days after surgery, or until you feel that it is no longer needed. Use the table on the last page of this handout to keep track of the medications you are taking. Important: Do not take more than 3000mg  of Tylenol or 3200mg  of Motrin in a 24-hour period. Do not take ibuprofen/Motrin if you have a history of bleeding stomach ulcers, severe kidney disease, &/or actively taking a blood thinner  What if I still have pain? If you have pain that is not controlled with the over-the-counter pain medications (Tylenol and Motrin or Advil) you might have what we call breakthrough pain. You will receive a prescription for a small amount of an opioid pain medication such as Oxycodone, Tramadol, or Tylenol with Codeine. Use these opioid pills in the first 24 hours after surgery if you have breakthrough pain. Do not take more than 1 pill every 4-6 hours.  If you still have uncontrolled pain after using all opioid pills, don't hesitate to call our staff using the number provided. We will help make sure you are managing your pain in the best way possible, and if necessary, we can provide a prescription for additional pain medication.   Day 1    Time  Name of Medication Number of pills taken  Amount of Acetaminophen  Pain Level   Comments  AM PM  AM PM       AM PM       AM PM       AM PM       AM PM       AM PM       AM PM       Total Daily amount of Acetaminophen Do not take more than  3,000 mg per day      Day 2    Time  Name of Medication Number of pills taken  Amount of Acetaminophen  Pain Level   Comments  AM PM       AM PM       AM PM       AM PM       AM PM       AM PM       AM PM       AM PM       Total Daily amount of Acetaminophen Do not take more than  3,000 mg per day      Day 3    Time  Name of Medication  Number of pills taken  Amount of Acetaminophen  Pain Level   Comments  AM PM       AM PM       AM PM       AM PM          AM PM       AM PM       AM PM       AM PM       Total Daily amount of Acetaminophen Do not take more than  3,000 mg per day      Day 4    Time  Name of Medication Number of pills taken  Amount of Acetaminophen  Pain Level   Comments  AM PM       AM PM       AM PM       AM PM       AM PM       AM PM       AM PM       AM PM       Total Daily amount of Acetaminophen Do not take more than  3,000 mg per day      Day 5    Time  Name of Medication Number of pills taken  Amount of Acetaminophen  Pain Level   Comments  AM PM       AM PM       AM PM       AM PM       AM PM       AM PM       AM PM       AM PM       Total Daily amount of Acetaminophen Do not take more than  3,000 mg per day       Day 6    Time  Name of Medication Number of pills taken  Amount of Acetaminophen  Pain Level  Comments  AM PM       AM PM       AM PM       AM PM       AM PM       AM PM       AM PM       AM PM  Total Daily amount of Acetaminophen Do not take more than  3,000 mg per day      Day 7    Time  Name of Medication Number of pills taken  Amount of Acetaminophen  Pain Level   Comments  AM PM       AM PM       AM PM       AM PM       AM PM       AM PM       AM PM       AM PM       Total Daily amount of Acetaminophen Do not take more than  3,000 mg per day        For additional information about how and where to safely dispose of unused opioid medications - [employer]  Disclaimer: This document contains information and/or instructional materials adapted from [place] for the typical patient with your condition. It does not replace medical advice from your health care provider because your experience may differ from that of the typical patient. Talk to your health care provider if you  have any questions about this document, your condition or your treatment plan. Adapted from [place] Instructions  Activity: Get plenty of rest for the remainder of the day. A responsible individual must stay with you for 24 hours following the procedure.  For the next 24 hours, DO NOT: -Drive a car -Paediatric nurse -Drink alcoholic beverages -Take any medication unless instructed by your physician -Make any legal decisions or sign important papers.  Meals: Start with liquid foods such as gelatin or soup. Progress to regular foods as tolerated. Avoid greasy, spicy, heavy foods. If nausea and/or vomiting occur, drink only clear liquids until the nausea and/or vomiting subsides. Call your physician if vomiting continues.  Special Instructions/Symptoms: Your throat may feel dry or sore from the anesthesia or the breathing tube placed in your throat during surgery. If this causes discomfort, gargle with warm salt water. The discomfort should disappear within 24 hours.  If you had a scopolamine patch placed behind your ear for the management of post- operative nausea and/or vomiting:  1. The medication in the patch is effective for 72 hours, after which it should be removed.  Wrap patch in a tissue and discard in the trash. Wash hands thoroughly with soap and water. 2. You may remove the patch earlier than 72 hours if you experience unpleasant side effects which may include dry mouth, dizziness or visual disturbances. 3. Avoid touching the patch. Wash your hands with soap and water after contact with the patch.    Information for Discharge Teaching: EXPAREL (bupivacaine liposome injectable suspension)   Your surgeon or anesthesiologist gave you EXPAREL(bupivacaine) to help control your pain after surgery.   EXPAREL is a local anesthetic that provides pain relief by numbing the tissue around the surgical site.  EXPAREL is designed to release pain  medication over time and can control pain for up to 72 hours.  Depending on how you respond to EXPAREL, you may require less pain medication during your recovery.  Possible side effects:  Temporary loss of sensation or ability to move in the area where bupivacaine was injected.  Nausea, vomiting, constipation  Rarely, numbness and tingling in your mouth or lips, lightheadedness, or anxiety may occur.  Call your doctor right away if you think you may be experiencing any of these sensations, or if  you have other questions regarding possible side effects.  Follow all other discharge instructions given to you by your surgeon or nurse. Eat a healthy diet and drink plenty of water or other fluids.  If you return to the hospital for any reason within 96 hours following the administration of EXPAREL, it is important for health care providers to know that you have received this anesthetic. A teal colored band has been placed on your arm with the date, time and amount of EXPAREL you have received in order to alert and inform your health care providers. Please leave this armband in place for the full 96 hours following administration, and then you may remove the band.

## 2019-01-08 NOTE — Transfer of Care (Signed)
Immediate Anesthesia Transfer of Care Note  Patient: Emma Casey  Procedure(s) Performed: LEFT BREAST LUMPECTOMY WITH RADIOACTIVE SEED AND LEFT AXILLARY DEEP SENTINEL LYMPH NODE BIOPSY WITH BLUE DYE INJECTION (Left Breast)  Patient Location: PACU  Anesthesia Type:GA combined with regional for post-op pain  Level of Consciousness: sedated  Airway & Oxygen Therapy: Patient Spontanous Breathing and Patient connected to face mask oxygen  Post-op Assessment: Report given to RN and Post -op Vital signs reviewed and stable  Post vital signs: Reviewed and stable  Last Vitals:  Vitals Value Taken Time  BP    Temp    Pulse 89 01/08/2019  2:09 PM  Resp    SpO2 100 % 01/08/2019  2:09 PM  Vitals shown include unvalidated device data.  Last Pain:  Vitals:   01/08/19 1130  TempSrc:   PainSc: 0-No pain         Complications: No apparent anesthesia complications

## 2019-01-08 NOTE — Anesthesia Procedure Notes (Signed)
Procedure Name: LMA Insertion Date/Time: 01/08/2019 12:42 PM Performed by: Willa Frater, CRNA Pre-anesthesia Checklist: Patient identified, Emergency Drugs available, Suction available and Patient being monitored Patient Re-evaluated:Patient Re-evaluated prior to induction Oxygen Delivery Method: Circle system utilized Preoxygenation: Pre-oxygenation with 100% oxygen Induction Type: IV induction Ventilation: Mask ventilation without difficulty LMA: LMA with gastric port inserted LMA Size: 4.0 Number of attempts: 1 Airway Equipment and Method: Bite block Placement Confirmation: positive ETCO2 Tube secured with: Tape Dental Injury: Teeth and Oropharynx as per pre-operative assessment

## 2019-01-09 ENCOUNTER — Encounter (HOSPITAL_BASED_OUTPATIENT_CLINIC_OR_DEPARTMENT_OTHER): Payer: Self-pay | Admitting: General Surgery

## 2019-01-09 NOTE — Anesthesia Postprocedure Evaluation (Signed)
Anesthesia Post Note  Patient: Emma Casey  Procedure(s) Performed: LEFT BREAST LUMPECTOMY WITH RADIOACTIVE SEED AND LEFT AXILLARY DEEP SENTINEL LYMPH NODE BIOPSY WITH BLUE DYE INJECTION (Left Breast)     Patient location during evaluation: PACU Anesthesia Type: General Level of consciousness: awake and alert Pain management: pain level controlled Vital Signs Assessment: post-procedure vital signs reviewed and stable Respiratory status: spontaneous breathing, nonlabored ventilation, respiratory function stable and patient connected to nasal cannula oxygen Cardiovascular status: blood pressure returned to baseline and stable Postop Assessment: no apparent nausea or vomiting Anesthetic complications: no    Last Vitals:  Vitals:   01/08/19 1500 01/08/19 1517  BP: 105/67 (!) 108/55  Pulse: 74 82  Resp: 14 18  Temp:  36.6 C  SpO2: 97% 97%    Last Pain:  Vitals:   01/08/19 1517  TempSrc: Oral  PainSc: 3                  Meleena Munroe

## 2019-01-10 ENCOUNTER — Telehealth: Payer: Self-pay | Admitting: *Deleted

## 2019-01-10 NOTE — Telephone Encounter (Signed)
Received order for oncotype testing. Requisition faxed to pathology and [facility] °

## 2019-01-11 ENCOUNTER — Other Ambulatory Visit: Payer: Self-pay | Admitting: General Surgery

## 2019-01-17 ENCOUNTER — Encounter (HOSPITAL_BASED_OUTPATIENT_CLINIC_OR_DEPARTMENT_OTHER): Payer: Self-pay | Admitting: *Deleted

## 2019-01-17 ENCOUNTER — Other Ambulatory Visit: Payer: Self-pay

## 2019-01-20 NOTE — H&P (Signed)
Emma Casey Location: [facility] Patient #: [id number] DOB: 1961-04-23 Undefined / Language: [name] / Race: Black or African American Female      History of Present Illness   This is a 58 year old female. Originally seen in the [facility] on 12/26/2018  by Dr. Burr Medico, Dr. Lisbeth Renshaw, and me. She was referred by Curlene Dolphin at the [facility] for evaluation and management of invasive cancer left breast. Her PCP is Varney Biles.     She lives in [place] and lives alone. She has no prior history of breast disease. Last mammogram 2 years ago. Recent mammogram shows a small density in the left breast. Not well seen by ultrasound. Architectural distortion. Medial to upper inner quadrant, posterior aspect. Right breast looked normal. Left axillary ultrasound negative.      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        Pathology showed 1.8 cm. IDC, grade III, and high grade DCIS. DCIS focally present at anterior margin AND less than 0.1 cm from inferior margin on main specimen. Additionally DCIS focally present at medial margin of reexcised medial margin. Posterior margin reexcision clear. All 3 SLN's negative ER 95%; PR 30%; Her-2 negative; Ki-67 20%  Past history reveals sleep apnea with CPAP. Non-insulin-dependent diabetes. Headaches. Wrist fractures. Bilateral tubal ligation. TAH and BSO through Pfannenstiel incision. Family history reveals father died of heart attack and TIAs. Mother died age 59 of "natural causes". A maternal aunt had breast cancer Social history reveals she is a widow since 57. Lives alone. 8 siblings. No children. Denies alcohol or tobacco. She is disabled due to wrist fracture. Lives in [place].  We had a long discussion about her margins and I  recommended re excision of all close and positive margins. She understands and agrees. Some cosmetic deformity possible but breasts are large and hopefully will be minimal.  We will schedule this re excisional surgery at the earliest convenience.      Past Surgical History Breast Biopsy  Left. Hysterectomy (not due to cancer) - Complete  Oral Surgery   Diagnostic Studies History Colonoscopy  5-10 years ago Mammogram  within last year Pap Smear  1-5 years ago  Medication History Medications Reconciled  Social History  Caffeine use  Carbonated beverages, Coffee, Tea. No alcohol use  No drug use  Tobacco use  Former smoker.  Family History  Arthritis  Brother. Bleeding disorder  Sister. Breast Cancer  Family Members In General. Cerebrovascular Accident  Father. Heart Disease  Father. Hypertension  Father. Kidney Disease  Father. Respiratory Condition  Brother.  Other Problems  Back Pain  Depression  Diabetes Mellitus  High blood pressure  Hypercholesterolemia  Migraine Headache     Review of Systems General Not Present- Appetite Loss, Chills, Fatigue, Fever, Night Sweats, Weight Gain and Weight Loss. Skin Not Present- Change in Wart/Mole, Dryness, Hives, Jaundice, New Lesions, Non-Healing Wounds, Rash and Ulcer. HEENT Present- Seasonal Allergies. Not Present- Earache, Hearing Loss, Hoarseness, Nose Bleed, Oral Ulcers, Ringing in the Ears, Sinus Pain, Sore Throat, Visual Disturbances, Wears glasses/contact lenses and Yellow Eyes. Respiratory Not Present- Bloody sputum, Chronic Cough, Difficulty Breathing, Snoring and Wheezing. Breast Not Present- Breast Mass, Breast Pain, Nipple Discharge and Skin Changes. Cardiovascular Not Present- Chest Pain, Difficulty Breathing Lying  Down, Leg Cramps, Palpitations, Rapid Heart Rate, Shortness of Breath and Swelling of Extremities. Gastrointestinal Not Present- Abdominal Pain, Bloating, Bloody Stool,  Change in Bowel Habits, Chronic diarrhea, Constipation, Difficulty Swallowing, Excessive gas, Gets full quickly at meals, Hemorrhoids, Indigestion, Nausea, Rectal Pain and Vomiting. Female Genitourinary Not Present- Frequency, Nocturia, Painful Urination, Pelvic Pain and Urgency. Musculoskeletal Not Present- Back Pain, Joint Pain, Joint Stiffness, Muscle Pain, Muscle Weakness and Swelling of Extremities. Neurological Not Present- Decreased Memory, Fainting, Headaches, Numbness, Seizures, Tingling, Tremor, Trouble walking and Weakness. Psychiatric Not Present- Anxiety, Bipolar, Change in Sleep Pattern, Depression, Fearful and Frequent crying. Endocrine Not Present- Cold Intolerance, Excessive Hunger, Hair Changes, Heat Intolerance, Hot flashes and New Diabetes. Hematology Not Present- Blood Thinners, Easy Bruising, Excessive bleeding, Gland problems, HIV and Persistent Infections.   Physical Exam General Mental Status-Alert. General Appearance-Consistent with stated age. Hydration-Well hydrated. Voice-Normal. Note: A bit overweight. BMI measurement pending   Head and Neck Head-normocephalic, atraumatic with no lesions or palpable masses. Trachea-midline. Thyroid Gland Characteristics - normal size and consistency.  Eye Eyeball - Bilateral-Extraocular movements intact. Sclera/Conjunctiva - Bilateral-No scleral icterus.  Chest and Lung Exam Chest and lung exam reveals -quiet, even and easy respiratory effort with no use of accessory muscles and on auscultation, normal breath sounds, no adventitious sounds and normal vocal resonance. Inspection Chest Wall - Normal. Back - normal.  Breast Note: Breasts are large, somewhat ptotic. Symmetrical. Minimal bruising.Scar Left breast UIQ. And axillary scar. Healing well No palpable mass on either side. No other skin change. No axillary adenopathy.   Cardiovascular Cardiovascular examination reveals -normal heart sounds,  regular rate and rhythm with no murmurs and normal pedal pulses bilaterally.  Abdomen Inspection Inspection of the abdomen reveals - No Hernias. Skin - Scar - Note: Well-healed Pfannenstiel incision. Palpation/Percussion Palpation and Percussion of the abdomen reveal - Soft, Non Tender, No Rebound tenderness, No Rigidity (guarding) and No hepatosplenomegaly. Auscultation Auscultation of the abdomen reveals - Bowel sounds normal.  Neurologic Neurologic evaluation reveals -alert and oriented x 3 with no impairment of recent or remote memory. Mental Status-Normal.  Musculoskeletal Normal Exam - Left-Upper Extremity Strength Normal and Lower Extremity Strength Normal. Normal Exam - Right-Upper Extremity Strength Normal and Lower Extremity Strength Normal.  Lymphatic Head & Neck  General Head & Neck Lymphatics: Bilateral - Description - Normal. Axillary  General Axillary Region: Bilateral - Description - Normal. Tenderness - Non Tender. Femoral & Inguinal  Generalized Femoral & Inguinal Lymphatics: Bilateral - Description - Normal. Tenderness - Non Tender.    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  We discussed and agreed that we will try to clear the margins with re excision through the existing scar.    This surgery will be scheduled soon    SLEEP APNEA WITH USE OF CONTINUOUS POSITIVE AIRWAY PRESSURE (CPAP) (G47.30) TYPE 2 DIABETES MELLITUS TREATED WITHOUT INSULIN (E11.9) HISTORY OF TOTAL ABDOMINAL HYSTERECTOMY AND BILATERAL SALPINGO-OOPHORECTOMY (Z90.710) FAMILY HISTORY OF BREAST CANCER (Z80.3) Impression: Maternal aunt    Edsel Petrin. [name], M.D., [facility] General and Minimally invasive Surgery Breast and Colorectal Surgery Office:   [phone] Pager:   [phone]

## 2019-01-21 ENCOUNTER — Other Ambulatory Visit: Payer: Self-pay

## 2019-01-21 ENCOUNTER — Other Ambulatory Visit (HOSPITAL_COMMUNITY)
Admission: RE | Admit: 2019-01-21 | Discharge: 2019-01-21 | Disposition: A | Discharge status: Home / Self Care | Payer: Medicare HMO | Source: Ambulatory Visit | Attending: General Surgery | Admitting: General Surgery

## 2019-01-21 ENCOUNTER — Telehealth: Payer: Self-pay | Admitting: *Deleted

## 2019-01-21 ENCOUNTER — Ambulatory Visit (HOSPITAL_BASED_OUTPATIENT_CLINIC_OR_DEPARTMENT_OTHER): Payer: Medicare HMO | Admitting: Hematology

## 2019-01-21 DIAGNOSIS — Z170 Estrogen receptor positive status [ER+]: Secondary | ICD-10-CM | POA: Diagnosis not present

## 2019-01-21 DIAGNOSIS — Z1159 Encounter for screening for other viral diseases: Secondary | ICD-10-CM | POA: Diagnosis present

## 2019-01-21 DIAGNOSIS — C50212 Malignant neoplasm of upper-inner quadrant of left female breast: Secondary | ICD-10-CM

## 2019-01-21 NOTE — Progress Notes (Signed)
Pt picked up G2 drink. Verbal and written instruction provided and pt verbalized understanding to have drink completed on DOS by 0730

## 2019-01-21 NOTE — Progress Notes (Signed)
[place]   [phone] [phone]   Clinic Follow up Note   Patient Care Team: Sandi Mealy, MD as PCP - General (Family Medicine) Mauro Kaufmann, RN as Oncology Nurse Navigator Rockwell Germany, RN as Oncology Nurse Navigator Fanny Skates, MD as Consulting Physician (General Surgery) Truitt Merle, MD as Consulting Physician (Hematology) Kyung Rudd, MD as Consulting Physician (Radiation Oncology)   I connected with [name] on 01/21/2019 at  3:45 PM EDT by video enabled telemedicine visit with [employer] and verified that I am speaking with the correct person using two identifiers.  I discussed the limitations, risks, security and privacy concerns of performing an evaluation and management service by telephone and the availability of in person appointments. I also discussed with the patient that there may be a patient responsible charge related to this service. The patient expressed understanding and agreed to proceed.   Other persons participating in the visit and their role in the encounter:  Her friend Maudie Mercury   Patient's location:  Her home  Provider's location:  My office  CHIEF COMPLAINT: f/u of left breast cancer   SUMMARY OF ONCOLOGIC HISTORY: Oncology History   Cancer Staging Malignant neoplasm of upper-inner quadrant of left breast in female, estrogen receptor positive ([place]) Staging form: Breast, AJCC 8th Edition - Clinical stage from 12/17/2018: Stage Unknown (cTX, cN0, cM0, G2, ER+, PR+, HER2-) - Signed by Truitt Merle, MD on 12/25/2018       Malignant neoplasm of upper-inner quadrant of left breast in female, estrogen receptor positive ([place])   11/28/2018 Mammogram    Diagnostic Mammogram 11/28/18 IMPRESSION:  Suspcious mass in upper inner quadrant of the left breast  at posterior depth.     12/17/2018 Initial Biopsy    Diagnosis 12/17/18 Breast, left, needle core biopsy, upper inner quadrant - INVASIVE DUCTAL CARCINOMA, SEE  COMMENT. - DUCTAL CARCINOMA IN SITU.    12/17/2018 Cancer Staging    Staging form: Breast, AJCC 8th Edition - Clinical stage from 12/17/2018: Stage Unknown (cTX, cN0, cM0, G2, ER+, PR+, HER2-) - Signed by Truitt Merle, MD on 12/25/2018    12/20/2018 Initial Diagnosis    Malignant neoplasm of upper-inner quadrant of left breast in female, estrogen receptor positive ([place])    12/20/2018 Receptors her2    Results: IMMUNOHISTOCHEMICAL AND MORPHOMETRIC ANALYSIS PERFORMED MANUALLY The tumor cells are NEGATIVE for Her2 (1+). Estrogen Receptor: 95%, POSITIVE, STRONG STAINING INTENSITY Progesterone Receptor: 30%, POSITIVE, STRONG STAINING INTENSITY Proliferation Marker Ki67: 20%    01/08/2019 Surgery    LEFT BREAST LUMPECTOMY WITH RADIOACTIVE SEED AND LEFT AXILLARY DEEP SENTINEL LYMPH NODE BIOPSY WITH BLUE DYE INJECTION by Dr [name] 01/08/19     01/08/2019 Pathology Results    Diagnosis 01/08/19 1. Breast, lumpectomy, left - INVASIVE DUCTAL CARCINOMA, GRADE III/III, SPANNING 1.8 CM. - DUCTAL CARCINOMA IN SITU, HIGH GRADE. - DUCTAL CARCINOMA IN SITU IS FOCALLY PRESENT AT THE ANTERIOR MARGIN OF SPECIMEN #1. - DUCTAL CARCINOMA IN SITU IS FOCALLY LESS THAN 0.1 CM FROM THE INFERIOR MARGIN OF SPECIMEN # 1. - SEE ONCOLOGY TABLE BELOW. 2. Breast, excision, left medial margin - DUCTAL CARCINOMA IN SITU, FOCALLY PRESENT AT THE MEDIAL MARGIN OF SPECIMEN # 2. 3. Breast, excision, left posterior margin - BENIGN BREAST PARENCHYMA. - THERE IS NO EVIDENCE OF MALIGNANCY. - SEE COMMENT. 4. Lymph node, sentinel, biopsy, left axillary - THERE IS NO EVIDENCE OF CARCINOMA IN 1 OF 1 LYMPH NODE (0/1). 5. Lymph node, sentinel, biopsy, left axillary -  THERE IS NO EVIDENCE OF CARCINOMA IN 1 OF 1 LYMPH NODE (0/1). 6. Lymph node, sentinel, biopsy, left axillary - THERE IS NO EVIDENCE OF CARCINOMA IN 1 OF 1 LYMPH NODE (0/1)    01/08/2019 Oncotype testing    recurrence score of 27 with risk of recurrence 16% with Tamoxifen or  AI alone. There is a benefit of chemotherapy >15%       CURRENT THERAPY:  PENDING re-excision surgery   INTERVAL HISTORY:  Isadora Delorey is here for a follow up of left breast cancer. She was able to identify herself by face ot face video. She note her left breast lumpectomy with well. She has second surgery in 2 days.  She notes she is thinking of mastectomy. She notes her maternal aunt and 3 of her maternal first cousins had breast cancer. She would like to proceed with genetic testing then decide on surgery.    REVIEW OF SYSTEMS:   Constitutional: Denies fevers, chills or abnormal weight loss Eyes: Denies blurriness of vision Ears, nose, mouth, throat, and face: Denies mucositis or sore throat Respiratory: Denies cough, dyspnea or wheezes Cardiovascular: Denies palpitation, chest discomfort or lower extremity swelling Gastrointestinal:  Denies nausea, heartburn or change in bowel habits Skin: Denies abnormal skin rashes Lymphatics: Denies new lymphadenopathy or easy bruising Neurological:Denies numbness, tingling or new weaknesses Behavioral/Psych: Mood is stable, no new changes  All other systems were reviewed with the patient and are negative.  MEDICAL HISTORY:  Past Medical History:  Diagnosis Date  . Anxiety   . Cancer ([place]) 11/2018   left breast ca  . Depression   . Diabetes mellitus without complication ([place])   . GERD (gastroesophageal reflux disease)   . Headache   . Herpes 1&2   . Hypertension   . OSA on CPAP    has CPAP but has not used in over a month    SURGICAL HISTORY: Past Surgical History:  Procedure Laterality Date  . ABDOMINAL HYSTERECTOMY    . BREAST LUMPECTOMY WITH RADIOACTIVE SEED AND SENTINEL LYMPH NODE BIOPSY Left 01/08/2019   Procedure: LEFT BREAST LUMPECTOMY WITH RADIOACTIVE SEED AND LEFT AXILLARY DEEP SENTINEL LYMPH NODE BIOPSY WITH BLUE DYE INJECTION;  Surgeon: Fanny Skates, MD;  Location: [facility];  Service: General;   Laterality: Left;  . COLONOSCOPY N/A 09/02/2015   Procedure: COLONOSCOPY;  Surgeon: Danie Binder, MD;  [facility];  Service: Endoscopy;  Laterality: N/A;  11:15 Am  . EYE SURGERY Right    cataract  . HAND SURGERY Right    broken bone  . TUBAL LIGATION      I have reviewed the social history and family history with the patient and they are unchanged from previous note.  ALLERGIES:  is allergic to asa [aspirin]; nsaids; and penicillins.  MEDICATIONS:  Current Outpatient Medications  Medication Sig Dispense Refill  . acyclovir (ZOVIRAX) 400 MG tablet Take 400 mg by mouth 2 (two) times daily.    Marland Kitchen amLODipine (NORVASC) 5 MG tablet Take 5 mg by mouth daily.    . Ascorbic Acid (VITAMIN C) 1000 MG tablet Take 1,000 mg by mouth daily.    Marland Kitchen atorvastatin (LIPITOR) 20 MG tablet Take 20 mg by mouth daily.    . cyanocobalamin 1000 MCG tablet Take 100 mcg by mouth daily.    . fluticasone (FLONASE) 50 MCG/ACT nasal spray Place into both nostrils daily.    . folic acid (FOLVITE) 675 MCG tablet Take 400 mcg by mouth daily.    Marland Kitchen  HYDROcodone-acetaminophen (NORCO) 5-325 MG tablet Take 1-2 tablets by mouth every 6 (six) hours as needed for moderate pain or severe pain. 20 tablet 0  . lisinopril (ZESTRIL) 2.5 MG tablet Take 2.5 mg by mouth at bedtime.    . metFORMIN (GLUMETZA) 500 MG (MOD) 24 hr tablet Take 500 mg by mouth daily with breakfast. Takes 2 tablets in the Am and one tablet at might    . montelukast (SINGULAIR) 10 MG tablet Take 10 mg by mouth at bedtime.    . NON FORMULARY Vitamin D3   5000 IU   Two tablets daily    . omeprazole (PRILOSEC) 20 MG capsule Take 20 mg by mouth daily.    Marland Kitchen PARoxetine (PAXIL) 10 MG tablet Take 10 mg by mouth at bedtime.    . potassium chloride (MICRO-K) 10 MEQ CR capsule Take 10 mEq by mouth 2 (two) times daily.    Marland Kitchen tiZANidine (ZANAFLEX) 4 MG capsule Take 4 mg by mouth 3 (three) times daily.    Marland Kitchen topiramate (TOPAMAX) 100 MG tablet Take 100 mg by mouth  daily.     No current facility-administered medications for this visit.     PHYSICAL EXAMINATION: ECOG PERFORMANCE STATUS: 1 - Symptomatic but completely ambulatory  No vitals taken today, Exam not performed today  LABORATORY DATA:  I have reviewed the data as listed CBC Latest Ref Rng & Units 12/26/2018  WBC 4.0 - 10.5 K/uL 9.3  Hemoglobin 12.0 - 15.0 g/dL 12.7  Hematocrit 36.0 - 46.0 % 43.1  Platelets 150 - 400 K/uL 334     CMP Latest Ref Rng & Units 12/26/2018  Glucose 70 - 99 mg/dL 123(H)  BUN 6 - 20 mg/dL 10  Creatinine 0.44 - 1.00 mg/dL 1.27(H)  Sodium 135 - 145 mmol/L 143  Potassium 3.5 - 5.1 mmol/L 3.7  Chloride 98 - 111 mmol/L 109  CO2 22 - 32 mmol/L 27  Calcium 8.9 - 10.3 mg/dL 9.3  Total Protein 6.5 - 8.1 g/dL 7.1  Total Bilirubin 0.3 - 1.2 mg/dL 0.8  Alkaline Phos 38 - 126 U/L 90  AST 15 - 41 U/L 11(L)  ALT 0 - 44 U/L 9      RADIOGRAPHIC STUDIES: I have personally reviewed the radiological images as listed and agreed with the findings in the report. No results found.   ASSESSMENT & PLAN:  Emma Casey is a 58 y.o. female with   1. Malignant neoplasm of upper-inner quadrant of left breast, pT1cN0, stage I, ER/PR+,  HER2-, Grade III, Oncotype RS 27 -She was diagnosed in 11/2018.  -She underwent left breast lumpectomy and SLN biopsy on 01/08/19. We discussed her pathology report which shows 1.8 cm IDC with DCIS, grade III, all 3 nodes negative. Her surgical margins were positive for DICS.  -given positive margins she will proceed with left breast re-excision on 01/23/19.   -Given her early stage disease, I do not recommend staging CT scans.   -We discussed her Oncotype report which shows recurrence score of 27 with 9-year risk of distant recurrence 16% with Tamoxifen or AI alone. There is a benefit of chemotherapy >15% in this group. I recommend she consider adjuvant chemotherapy with moderately intensive docetaxel and cytoxan (TC) q3weeks for 4 cycles.    --Chemotherapy consent: Side effects including but does not not limited to, fatigue, nausea, vomiting, diarrhea, hair loss (small risk of permanent hair loss), neuropathy, fluid retention, renal and kidney dysfunction, neutropenic fever, needed for blood transfusion, bleeding, were discussed with  patient in great detail. She agrees to proceed. -the goal of therapy is curative. We plan to start after she recovers from re-excision surgery  -She is considering b/l mastectomy to prevent future breast cancer. I disucssed this is an option given her family history of breast cancer, however it may not be medically necessary if her genetic test is negative. I recommend genetic testing first, she is interested, and I will make an urgent referral -I will send message to Dr [name], possibly postpone her re-excision surgery which is scheduled for 6/3.  -Given she will likely proceed with chemo, I discussed the option of PAC placement. She is agreeable.  -she would benefit from adjuvant breast radiation, unless she has mastectomy, and adjuvant antiestrogen therapy.  I discussed with her briefly, she is interested. -F/u after her next surgery    2. HTN, DM  -On Amlodipine, lisinopril, metformin -HTN controlled  3. CKD stage III -secondary to DM and HTN.  -I encouraged her to drink plenty of water and to avoid NSAID's -Will monitor   4. Smoking Cessation  -She smoked for 10 years before she quit. She restarted after recent cancer diagnosis  -I strongly encouraged her to quit smoking completely and continue cessation. She understands and agrees.   5. Genetic Testing -Patient recently found out other than her maternal aunt, 3 of her maternal first cousins have breast cancer as well.  -I will refer her to genetics urgently and then she can decide on her surgical options. She is agreeable    PLAN:  -Urgent genetics referral  -I called Dr [name] after her visit. He will call pt tomorrow to postpone  her surgery on 6/3 until genetic test result returns  -F/u open, will see her back in 2 weeks after her next breast surgery, to prepare starting her on adjuvant chemo TC   -she agrees to have a port placement during her surgery    No problem-specific Assessment & Plan notes found for this encounter.   No orders of the defined types were placed in this encounter.  I discussed the assessment and treatment plan with the patient. The patient was provided an opportunity to ask questions and all were answered. The patient agreed with the plan and demonstrated an understanding of the instructions.  The patient was advised to call back or seek an in-person evaluation if the symptoms worsen or if the condition fails to improve as anticipated.  I provided 25 minutes of face-to-face video visit time during this encounter, and > 50% was spent counseling as documented under my assessment & plan.    Truitt Merle, MD 01/21/2019   I, Joslyn Devon, am acting as scribe for Truitt Merle, MD.   I have reviewed the above documentation for accuracy and completeness, and I agree with the above.

## 2019-01-21 NOTE — Telephone Encounter (Signed)
Received oncotype score of 27. Physician team notified.

## 2019-01-22 ENCOUNTER — Telehealth: Payer: Self-pay | Admitting: Genetic Counselor

## 2019-01-22 ENCOUNTER — Telehealth: Payer: Self-pay | Admitting: *Deleted

## 2019-01-22 ENCOUNTER — Encounter: Payer: Self-pay | Admitting: Hematology

## 2019-01-22 ENCOUNTER — Encounter (HOSPITAL_COMMUNITY): Payer: Self-pay | Admitting: Hematology

## 2019-01-22 NOTE — Telephone Encounter (Signed)
Spoke to pt regarding genetic counseling recommended per Dr. Burr Medico. Confirmed appt for 6/3 at 10am for genetic counseling and 12pm for lab draw. Denies further needs or questions at this time.

## 2019-01-23 ENCOUNTER — Inpatient Hospital Stay: Payer: Medicare HMO | Attending: Hematology | Admitting: Genetic Counselor

## 2019-01-23 ENCOUNTER — Telehealth: Payer: Self-pay | Admitting: Genetic Counselor

## 2019-01-23 ENCOUNTER — Encounter: Payer: Self-pay | Admitting: Genetic Counselor

## 2019-01-23 ENCOUNTER — Other Ambulatory Visit: Payer: Self-pay

## 2019-01-23 ENCOUNTER — Other Ambulatory Visit: Payer: Self-pay | Admitting: Genetic Counselor

## 2019-01-23 ENCOUNTER — Ambulatory Visit (HOSPITAL_BASED_OUTPATIENT_CLINIC_OR_DEPARTMENT_OTHER): Admission: RE | Admit: 2019-01-23 | Payer: Medicare HMO | Source: Home / Self Care | Admitting: General Surgery

## 2019-01-23 ENCOUNTER — Inpatient Hospital Stay: Payer: Medicare HMO

## 2019-01-23 ENCOUNTER — Other Ambulatory Visit: Payer: Medicare HMO

## 2019-01-23 DIAGNOSIS — F17210 Nicotine dependence, cigarettes, uncomplicated: Secondary | ICD-10-CM | POA: Diagnosis not present

## 2019-01-23 DIAGNOSIS — C50212 Malignant neoplasm of upper-inner quadrant of left female breast: Secondary | ICD-10-CM

## 2019-01-23 DIAGNOSIS — Z170 Estrogen receptor positive status [ER+]: Secondary | ICD-10-CM

## 2019-01-23 DIAGNOSIS — Z803 Family history of malignant neoplasm of breast: Secondary | ICD-10-CM | POA: Diagnosis not present

## 2019-01-23 LAB — NOVEL CORONAVIRUS, NAA (HOSP ORDER, SEND-OUT TO REF LAB; TAT 18-24 HRS): SARS-CoV-2, NAA: NOT DETECTED

## 2019-01-23 SURGERY — EXCISION, LESION, BREAST
Anesthesia: General | Site: Breast | Laterality: Left

## 2019-01-23 NOTE — Telephone Encounter (Signed)
Called patient regarding upcoming [facility] appointment, patient would prefer this be a walk in visit. Keep labs.

## 2019-01-23 NOTE — Progress Notes (Signed)
REFERRING PROVIDER: Truitt Merle, MD [address] [place], [place] [address]  PRIMARY PROVIDER:  Sandi Mealy, MD  PRIMARY REASON FOR VISIT:  1. Malignant neoplasm of upper-inner quadrant of left breast in female, estrogen receptor positive ([facility])   2. Family history of breast cancer      HISTORY OF PRESENT ILLNESS:   Emma Casey, a 58 y.o. female, was seen for a [facility] cancer genetics consultation at the request of Dr. Burr Medico due to a personal and family history of breast cancer.  Emma Casey presents to clinic today to discuss the possibility of a hereditary predisposition to cancer, genetic testing, and to further clarify her future cancer risks, as well as potential cancer risks for family members.   In April 2020, at the age of 73, Emma Casey was diagnosed with invasive ductal carcinoma of the left breast. The treatment plan surgery, chemotherapy and radiology.      CANCER HISTORY:  Oncology History   Cancer Staging Malignant neoplasm of upper-inner quadrant of left breast in female, estrogen receptor positive ([facility]) Staging form: Breast, AJCC 8th Edition - Clinical stage from 12/17/2018: Stage Unknown (cTX, cN0, cM0, G2, ER+, PR+, HER2-) - Signed by Truitt Merle, MD on 12/25/2018 - Pathologic stage from 01/08/2019: Stage IA (pT1c, pN0, cM0, G3, ER+, PR+, HER2-) - Signed by Truitt Merle, MD on 01/22/2019       Malignant neoplasm of upper-inner quadrant of left breast in female, estrogen receptor positive ([facility])   11/28/2018 Mammogram    Diagnostic Mammogram 11/28/18 IMPRESSION:  Suspcious mass in upper inner quadrant of the left breast  at posterior depth.     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    12/17/2018 Cancer Staging    Staging form: Breast, AJCC 8th Edition - Clinical stage from 12/17/2018: Stage Unknown (cTX, cN0, cM0, G2, ER+,  PR+, HER2-) - Signed by Truitt Merle, MD on 12/25/2018    12/20/2018 Initial Diagnosis    Malignant neoplasm of upper-inner quadrant of left breast in female, estrogen receptor positive ([facility])    12/20/2018 Receptors her2    Results: IMMUNOHISTOCHEMICAL AND MORPHOMETRIC ANALYSIS PERFORMED MANUALLY The tumor cells are NEGATIVE for Her2 (1+). Estrogen Receptor: 95%, POSITIVE, STRONG STAINING INTENSITY Progesterone Receptor: 30%, POSITIVE, STRONG STAINING INTENSITY Proliferation Marker Ki67: 20%    01/08/2019 Surgery    LEFT BREAST LUMPECTOMY WITH RADIOACTIVE SEED AND LEFT AXILLARY DEEP SENTINEL LYMPH NODE BIOPSY WITH BLUE DYE INJECTION by Dr [name] 01/08/19     01/08/2019 Pathology Results    Diagnosis 01/08/19 1. Breast, lumpectomy, left - INVASIVE DUCTAL CARCINOMA, GRADE III/III, SPANNING 1.8 CM. - DUCTAL CARCINOMA IN SITU, HIGH GRADE. - DUCTAL CARCINOMA IN SITU IS FOCALLY PRESENT AT THE ANTERIOR MARGIN OF SPECIMEN #1. - DUCTAL CARCINOMA IN SITU IS FOCALLY LESS THAN 0.1 CM FROM THE INFERIOR MARGIN OF SPECIMEN # 1. - SEE ONCOLOGY TABLE BELOW. 2. Breast, excision, left medial margin - DUCTAL CARCINOMA IN SITU, FOCALLY PRESENT AT THE MEDIAL MARGIN OF SPECIMEN # 2. 3. Breast, excision, left posterior margin - BENIGN BREAST PARENCHYMA. - THERE IS NO EVIDENCE OF MALIGNANCY. - SEE COMMENT. 4. Lymph node, sentinel, biopsy, left axillary - THERE IS NO EVIDENCE OF CARCINOMA IN 1 OF 1 LYMPH NODE (0/1). 5. Lymph node, sentinel, biopsy, left axillary - THERE IS NO EVIDENCE OF CARCINOMA IN 1 OF 1 LYMPH NODE (0/1). 6. Lymph  node, sentinel, biopsy, left axillary - THERE IS NO EVIDENCE OF CARCINOMA IN 1 OF 1 LYMPH NODE (0/1)    01/08/2019 Oncotype testing    recurrence score of 27 with risk of recurrence 16% with Tamoxifen or AI alone. There is a benefit of chemotherapy >15%     01/08/2019 Cancer Staging    Staging form: Breast, AJCC 8th Edition - Pathologic stage from 01/08/2019: Stage IA (pT1c, pN0,  cM0, G3, ER+, PR+, HER2-) - Signed by Truitt Merle, MD on 01/22/2019      RISK FACTORS:  Menarche was at age 4*12.  First live birth at age N/A.  OCP use for approximately 1 years.  Ovaries intact: no.  Hysterectomy: yes.  Menopausal status: postmenopausal.  HRT use: 0 years. Colonoscopy: yes; normal. Mammogram within the last year: yes. Number of breast biopsies: 1. Up to date with pelvic exams: yes. Any excessive radiation exposure in the past: no  Past Medical History:  Diagnosis Date  . Anxiety   . Cancer ([facility]) 11/2018   left breast ca  . Depression   . Diabetes mellitus without complication ([facility])   . Family history of breast cancer   . GERD (gastroesophageal reflux disease)   . Headache   . Herpes 1&2   . Hypertension   . OSA on CPAP    has CPAP but has not used in over a month    Past Surgical History:  Procedure Laterality Date  . ABDOMINAL HYSTERECTOMY    . BREAST LUMPECTOMY WITH RADIOACTIVE SEED AND SENTINEL LYMPH NODE BIOPSY Left 01/08/2019   Procedure: LEFT BREAST LUMPECTOMY WITH RADIOACTIVE SEED AND LEFT AXILLARY DEEP SENTINEL LYMPH NODE BIOPSY WITH BLUE DYE INJECTION;  Surgeon: Fanny Skates, MD;  Location: [facility];  Service: General;  Laterality: Left;  . COLONOSCOPY N/A 09/02/2015   Procedure: COLONOSCOPY;  Surgeon: Danie Binder, MD;  [facility];  Service: Endoscopy;  Laterality: N/A;  11:15 Am  . EYE SURGERY Right    cataract  . HAND SURGERY Right    broken bone  . TUBAL LIGATION      Social History   Socioeconomic History  . Marital status: Married    Spouse name: Not on file  . Number of children: Not on file  . Years of education: Not on file  . Highest education level: Not on file  Occupational History  . Not on file  Social Needs  . Financial resource strain: Not on file  . Food insecurity:    Worry: Not on file    Inability: Not on file  . Transportation needs:    Medical: Not on file    Non-medical:  Not on file  Tobacco Use  . Smoking status: Current Every Day Smoker    Packs/day: 0.50    Years: 10.00    Pack years: 5.00    Types: Cigarettes  . Smokeless tobacco: Never Used  Substance and Sexual Activity  . Alcohol use: No  . Drug use: No  . Sexual activity: Not on file  Lifestyle  . Physical activity:    Days per week: Not on file    Minutes per session: Not on file  . Stress: Not on file  Relationships  . Social connections:    Talks on phone: Not on file    Gets together: Not on file    Attends religious service: Not on file    Active member of club or organization: Not on file  Attends meetings of clubs or organizations: Not on file    Relationship status: Not on file  Other Topics Concern  . Not on file  Social History Narrative  . Not on file     FAMILY HISTORY:  We obtained a detailed, 4-generation family history.  Significant diagnoses are listed below: Family History  Problem Relation Age of Onset  . Transient ischemic attack Father   . Heart attack Father   . Stroke Brother   . Breast cancer Maternal Aunt 55  . Breast cancer Cousin        mat first cousin  . Breast cancer Cousin        mat first cousin  . Cancer Cousin        mat first cousin    The patient does not have children.  She has five sisters and four brothers, none who had cancer.  Both parents are deceased.  The patient's mother did not have cancer.  She had three sisters, one who had breast cancer around age 35.  This sister had a son with cancer.  Each of the other two sisters each had one daughter who had breast cancer.  The maternal grandparents are deceased.  The patient's father died of a heart attack.  He had many siblings, none who had cancer. The paternal grandparents are deceased.  Emma Casey is unaware of previous family history of genetic testing for hereditary cancer risks. Patient's maternal ancestors are of African American descent, and paternal ancestors are of  African American descent. There is no reported Ashkenazi Jewish ancestry. There is no known consanguinity.  GENETIC COUNSELING ASSESSMENT: Emma Casey is a 58 y.o. female with a personal and family history of breast cancer which is somewhat suggestive of a hereditary cancer syndrome and predisposition to cancer. We, therefore, discussed and recommended the following at today's visit.   DISCUSSION: We discussed that 5 - 10% of breast cancer is hereditary, with most cases associated with hereditary cancer syndromes.  There are other genes that can be associated with hereditary breast cancer syndromes.  These include ATM, CHEK2 and PALB2.  We discussed that testing is beneficial for several reasons including knowing how to follow individuals after completing their treatment, identifying whether potential treatment options such as PARP inhibitors would be beneficial, and understand if other family members could be at risk for cancer and allow them to undergo genetic testing.   We reviewed the characteristics, features and inheritance patterns of hereditary cancer syndromes. We also discussed genetic testing, including the appropriate family members to test, the process of testing, insurance coverage and turn-around-time for results. We discussed the implications of a negative, positive and/or variant of uncertain significant result. In order to get genetic test results in a timely manner so that Emma Casey can use these genetic test results for surgical decisions, we recommended Emma Casey pursue genetic testing for the STAT panel. Once complete, we recommend Emma Casey pursue reflex genetic testing to the common hereditary cancer gene panel. The Common Hereditary Gene Panel offered by [facility] includes sequencing and/or deletion duplication testing of the following 48 genes: APC, ATM, AXIN2, BARD1, BMPR1A, BRCA1, BRCA2, BRIP1, CDH1, CDK4, CDKN2A (p14ARF), CDKN2A (p16INK4a), CHEK2, CTNNA1, DICER1, EPCAM  (Deletion/duplication testing only), GREM1 (promoter region deletion/duplication testing only), KIT, MEN1, MLH1, MSH2, MSH3, MSH6, MUTYH, NBN, NF1, NHTL1, PALB2, PDGFRA, PMS2, POLD1, POLE, PTEN, RAD50, RAD51C, RAD51D, RNF43, SDHB, SDHC, SDHD, SMAD4, SMARCA4. STK11, TP53, TSC1, TSC2, and VHL.  The following genes were evaluated for  sequence changes only: SDHA and HOXB13 c.251G>A variant only.   Based on Emma Casey's personal and family history of cancer, she meets medical criteria for genetic testing. Despite that she meets criteria, she may still have an out of pocket cost.   PLAN: After considering the risks, benefits, and limitations, Emma Casey provided informed consent to pursue genetic testing and the blood sample was [facility] to [facility] for analysis of the common hereditary cancer panel. Results should be available within approximately 5-10 days for the STAT panel, and eventually the remainder of the panel will be 2-3 weeks' time, at which point they will be disclosed by telephone to Emma Casey, as will any additional recommendations warranted by these results. Emma Casey will receive a summary of her genetic counseling visit and a copy of her results once available. This information will also be available in Epic.   Lastly, we encouraged Emma Casey to remain in contact with cancer genetics annually so that we can continuously update the family history and inform her of any changes in cancer genetics and testing that may be of benefit for this family.   Emma Casey questions were answered to her satisfaction today. Our contact information was provided should additional questions or concerns arise. Thank you for the referral and allowing [place] to share in the care of your patient.   Karen P. Florene Glen, [place], [facility] Certified Genetic Counselor [email or web address] phone: [phone]  The patient was seen for a total of 45 minutes in face-to-face genetic counseling.  This patient  was discussed with Drs. Magrinat, Lindi Adie and/or Burr Medico who agrees with the above.    _______________________________________________________________________ For Office Staff:  Number of people involved in session: 1 Was an Intern/ student involved with case: no

## 2019-01-29 ENCOUNTER — Ambulatory Visit: Payer: Self-pay | Admitting: Genetic Counselor

## 2019-01-29 ENCOUNTER — Telehealth: Payer: Self-pay | Admitting: Genetic Counselor

## 2019-01-29 ENCOUNTER — Encounter: Payer: Self-pay | Admitting: Genetic Counselor

## 2019-01-29 DIAGNOSIS — Z1379 Encounter for other screening for genetic and chromosomal anomalies: Secondary | ICD-10-CM | POA: Insufficient documentation

## 2019-01-29 NOTE — Telephone Encounter (Signed)
Revealed negative genetic testing.  Discussed that we do not know why she has breast cancer or why there is cancer in the family. It could be due to a different gene that we are not testing, or maybe our current technology may not be able to pick something up.  It will be important for her to keep in contact with genetics to keep up with whether additional testing may be needed. 

## 2019-01-29 NOTE — Progress Notes (Signed)
HPI:  Ms. Lunney was previously seen in the [facility] due to a personal and family history of breast cancer and concerns regarding a hereditary predisposition to cancer. Please refer to our prior cancer genetics clinic note for more information regarding our discussion, assessment and recommendations, at the time. Ms. Osberg's recent genetic test results were disclosed to her, as were recommendations warranted by these results. These results and recommendations are discussed in more detail below.  CANCER HISTORY:  Oncology History   Cancer Staging Malignant neoplasm of upper-inner quadrant of left breast in female, estrogen receptor positive ([facility]) Staging form: Breast, AJCC 8th Edition - Clinical stage from 12/17/2018: Stage Unknown (cTX, cN0, cM0, G2, ER+, PR+, HER2-) - Signed by Feng, Yan, MD on 12/25/2018 - Pathologic stage from 01/08/2019: Stage IA (pT1c, pN0, cM0, G3, ER+, PR+, HER2-) - Signed by Feng, Yan, MD on 01/22/2019       Malignant neoplasm of upper-inner quadrant of left breast in female, estrogen receptor positive ([facility])   11/28/2018 Mammogram    Diagnostic Mammogram 11/28/18 IMPRESSION:  Suspcious mass in upper inner quadrant of the left breast  at posterior depth.     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    12/17/2018 Cancer Staging    Staging form: Breast, AJCC 8th Edition - Clinical stage from 12/17/2018: Stage Unknown (cTX, cN0, cM0, G2, ER+, PR+, HER2-) - Signed by Feng, Yan, MD on 12/25/2018    12/20/2018 Initial Diagnosis    Malignant neoplasm of upper-inner quadrant of left breast in female, estrogen receptor positive ([facility])    12/20/2018 Receptors her2    Results: IMMUNOHISTOCHEMICAL AND MORPHOMETRIC ANALYSIS PERFORMED MANUALLY The tumor cells are NEGATIVE for Her2 (1+). Estrogen Receptor: 95%, POSITIVE, STRONG  STAINING INTENSITY Progesterone Receptor: 30%, POSITIVE, STRONG STAINING INTENSITY Proliferation Marker Ki67: 20%    01/08/2019 Surgery    LEFT BREAST LUMPECTOMY WITH RADIOACTIVE SEED AND LEFT AXILLARY DEEP SENTINEL LYMPH NODE BIOPSY WITH BLUE DYE INJECTION by Dr Ingram 01/08/19     01/08/2019 Pathology Results    Diagnosis 01/08/19 1. Breast, lumpectomy, left - INVASIVE DUCTAL CARCINOMA, GRADE III/III, SPANNING 1.8 CM. - DUCTAL CARCINOMA IN SITU, HIGH GRADE. - DUCTAL CARCINOMA IN SITU IS FOCALLY PRESENT AT THE ANTERIOR MARGIN OF SPECIMEN #1. - DUCTAL CARCINOMA IN SITU IS FOCALLY LESS THAN 0.1 CM FROM THE INFERIOR MARGIN OF SPECIMEN # 1. - SEE ONCOLOGY TABLE BELOW. 2. Breast, excision, left medial margin - DUCTAL CARCINOMA IN SITU, FOCALLY PRESENT AT THE MEDIAL MARGIN OF SPECIMEN # 2. 3. Breast, excision, left posterior margin - BENIGN BREAST PARENCHYMA. - THERE IS NO EVIDENCE OF MALIGNANCY. - SEE COMMENT. 4. Lymph node, sentinel, biopsy, left axillary - THERE IS NO EVIDENCE OF CARCINOMA IN 1 OF 1 LYMPH NODE (0/1). 5. Lymph node, sentinel, biopsy, left axillary - THERE IS NO EVIDENCE OF CARCINOMA IN 1 OF 1 LYMPH NODE (0/1). 6. Lymph node, sentinel, biopsy, left axillary - THERE IS NO EVIDENCE OF CARCINOMA IN 1 OF 1 LYMPH NODE (0/1)    01/08/2019 Oncotype testing    recurrence score of 27 with risk of recurrence 16% with Tamoxifen or AI alone. There is a benefit of chemotherapy >15%     01/08/2019 Cancer Staging    Staging form: Breast, AJCC 8th Edition - Pathologic stage from 01/08/2019: Stage IA (pT1c, pN0, cM0, G3, ER+, PR+, HER2-) -   Signed by Truitt Merle, MD on 01/22/2019    01/29/2019 Genetic Testing    Negative genetic testing on the common hereditary cancer panel.  The Common Hereditary Gene Panel offered by [facility] includes sequencing and/or deletion duplication testing of the following 48 genes: APC, ATM, AXIN2, BARD1, BMPR1A, BRCA1, BRCA2, BRIP1, CDH1, CDK4, CDKN2A (p14ARF), CDKN2A  (p16INK4a), CHEK2, CTNNA1, DICER1, EPCAM (Deletion/duplication testing only), GREM1 (promoter region deletion/duplication testing only), KIT, MEN1, MLH1, MSH2, MSH3, MSH6, MUTYH, NBN, NF1, NHTL1, PALB2, PDGFRA, PMS2, POLD1, POLE, PTEN, RAD50, RAD51C, RAD51D, RNF43, SDHB, SDHC, SDHD, SMAD4, SMARCA4. STK11, TP53, TSC1, TSC2, and VHL.  The following genes were evaluated for sequence changes only: SDHA and HOXB13 c.251G>A variant only. The report date is January 29, 2019.     FAMILY HISTORY:  We obtained a detailed, 4-generation family history.  Significant diagnoses are listed below: Family History  Problem Relation Age of Onset  . Transient ischemic attack Father   . Heart attack Father   . Stroke Brother   . Breast cancer Maternal Aunt 59  . Breast cancer Cousin        mat first cousin  . Breast cancer Cousin        mat first cousin  . Cancer Cousin        mat first cousin    The patient does not have children.  She has five sisters and four brothers, none who had cancer.  Both parents are deceased.  The patient's mother did not have cancer.  She had three sisters, one who had breast cancer around age 39.  This sister had a son with cancer.  Each of the other two sisters each had one daughter who had breast cancer.  The maternal grandparents are deceased.  The patient's father died of a heart attack.  He had many siblings, none who had cancer. The paternal grandparents are deceased.  Ms. Hove is unaware of previous family history of genetic testing for hereditary cancer risks. Patient's maternal ancestors are of African American descent, and paternal ancestors are of African American descent. There is no reported Ashkenazi Jewish ancestry. There is no known consanguinity.    GENETIC TEST RESULTS: Genetic testing reported out on January 29, 2019 through the common hereditary cancer panel found no pathogenic mutations. The Common Hereditary Gene Panel offered by [facility] includes sequencing  and/or deletion duplication testing of the following 47 genes: APC, ATM, AXIN2, BARD1, BMPR1A, BRCA1, BRCA2, BRIP1, CDH1, CDK4, CDKN2A (p14ARF), CDKN2A (p16INK4a), CHEK2, CTNNA1, DICER1, EPCAM (Deletion/duplication testing only), GREM1 (promoter region deletion/duplication testing only), KIT, MEN1, MLH1, MSH2, MSH3, MSH6, MUTYH, NBN, NF1, NHTL1, PALB2, PDGFRA, PMS2, POLD1, POLE, PTEN, RAD50, RAD51C, RAD51D, SDHB, SDHC, SDHD, SMAD4, SMARCA4. STK11, TP53, TSC1, TSC2, and VHL.  The following genes were evaluated for sequence changes only: SDHA and HOXB13 c.251G>A variant only. The test report has been scanned into EPIC and is located under the Molecular Pathology section of the Results Review tab.  A portion of the result report is included below for reference.     We discussed with Ms. Nessler that because current genetic testing is not perfect, it is possible there may be a gene mutation in one of these genes that current testing cannot detect, but that chance is small.  We also discussed, that there could be another gene that has not yet been discovered, or that we have not yet tested, that is responsible for the cancer diagnoses in the family. It is also possible there is a hereditary cause  for the cancer in the family that Ms. Mccole did not inherit and therefore was not identified in her testing.  Therefore, it is important to remain in touch with cancer genetics in the future so that we can continue to offer Ms. Killingsworth the most up to date genetic testing.   ADDITIONAL GENETIC TESTING: We discussed with Ms. Karasik that there are other genes that are associated with increased cancer risk that can be analyzed. Should Ms. Service wish to pursue additional genetic testing, we are happy to discuss and coordinate this testing, at any time.    CANCER SCREENING RECOMMENDATIONS: Ms. Ferreri's test result is considered negative (normal).  This means that we have not identified a hereditary cause for her  personal and family history of cancer at this time. Most cancers happen by chance and this negative test suggests that her cancer may fall into this category.    While reassuring, this does not definitively rule out a hereditary predisposition to cancer. It is still possible that there could be genetic mutations that are undetectable by current technology. There could be genetic mutations in genes that have not been tested or identified to increase cancer risk.  Therefore, it is recommended she continue to follow the cancer management and screening guidelines provided by her oncology and primary healthcare provider.   An individual's cancer risk and medical management are not determined by genetic test results alone. Overall cancer risk assessment incorporates additional factors, including personal medical history, family history, and any available genetic information that may result in a personalized plan for cancer prevention and surveillance  RECOMMENDATIONS FOR FAMILY MEMBERS:  Individuals in this family might be at some increased risk of developing cancer, over the general population risk, simply due to the family history of cancer.  We recommended women in this family have a yearly mammogram beginning at age 40, or 10 years younger than the earliest onset of cancer, an annual clinical breast exam, and perform monthly breast self-exams. Women in this family should also have a gynecological exam as recommended by their primary provider. All family members should have a colonoscopy by age 50.  FOLLOW-UP: Lastly, we discussed with Ms. Zaccaro that cancer genetics is a rapidly advancing field and it is possible that new genetic tests will be appropriate for her and/or her family members in the future. We encouraged her to remain in contact with cancer genetics on an annual basis so we can update her personal and family histories and let her know of advances in cancer genetics that may benefit this family.    Our contact number was provided. Ms. Gonet's questions were answered to her satisfaction, and she knows she is welcome to call us at anytime with additional questions or concerns.   Karen Powell, MS, CGC Certified Genetic Counselor [email or web address]  

## 2019-02-11 ENCOUNTER — Encounter: Payer: Self-pay | Admitting: *Deleted

## 2019-02-14 ENCOUNTER — Other Ambulatory Visit: Payer: Self-pay | Admitting: General Surgery

## 2019-02-18 ENCOUNTER — Encounter: Payer: Self-pay | Admitting: *Deleted

## 2019-02-18 ENCOUNTER — Telehealth: Payer: Self-pay | Admitting: Hematology

## 2019-02-18 NOTE — Telephone Encounter (Signed)
Scheduled appt per 6/26 sch message - pt is aware of appt date and time.  

## 2019-02-20 NOTE — Progress Notes (Signed)
[facility] [address], [place] [address] Phone: [phone] Fax: [phone]      Your procedure is scheduled on July 10th.  Report to [facility] "A" at 12:00 P.M., and check in at the Admitting office.  Call this number if you have problems the morning of surgery:  [phone]  Call [phone] if you have any questions prior to your surgery date Monday-Friday 8am-4pm    Remember:  Do not eat or drink after midnight.   Take these medicines the morning of surgery with A SIP OF WATER   Amlodipine (Norvasc)  Atorvastain (Lipitor)  Flonase - if needed  Hydrocodone-Acetaminophen - if needed  Claritin - if needed  Omeprazole (Prilosec)  Tizanidine (Zanaflex)    7 days prior to surgery STOP taking any Aspirin (unless otherwise instructed by your surgeon), Aleve, Naproxen, Ibuprofen, Motrin, Advil, Goody's, BC's, all herbal medications, fish oil, and all vitamins.   WHAT DO I DO ABOUT MY DIABETES MEDICATION?   Marland Kitchen Do not take oral diabetes medicines (pills) the morning of surgery. - Metformin    How to Manage Your Diabetes Before and After Surgery  Why is it important to control my blood sugar before and after surgery? . Improving blood sugar levels before and after surgery helps healing and can limit problems. . A way of improving blood sugar control is eating a healthy diet by: o  Eating less sugar and carbohydrates o  Increasing activity/exercise o  Talking with your doctor about reaching your blood sugar goals . High blood sugars (greater than 180 mg/dL) can raise your risk of infections and slow your recovery, so you will need to focus on controlling your diabetes during the weeks before surgery. . Make sure that the doctor who takes care of your diabetes knows about your planned surgery including the date and location.  How do I manage my blood sugar before surgery? . Check your blood sugar at least 4  times a day, starting 2 days before surgery, to make sure that the level is not too high or low. o Check your blood sugar the morning of your surgery when you wake up and every 2 hours until you get to the Short Stay unit. . If your blood sugar is less than 70 mg/dL, you will need to treat for low blood sugar: o Do not take insulin. o Treat a low blood sugar (less than 70 mg/dL) with  cup of clear juice (cranberry or apple), 4 glucose tablets, OR glucose gel. o Recheck blood sugar in 15 minutes after treatment (to make sure it is greater than 70 mg/dL). If your blood sugar is not greater than 70 mg/dL on recheck, call [phone] for further instructions. . Report your blood sugar to the short stay nurse when you get to Short Stay.  . If you are admitted to the hospital after surgery: o Your blood sugar will be checked by the staff and you will probably be given insulin after surgery (instead of oral diabetes medicines) to make sure you have good blood sugar levels. o The goal for blood sugar control after surgery is 80-180 mg/dL.    The Morning of Surgery  Do not wear jewelry, make-up or nail polish.  Do not wear lotions, powders, or perfumes, or deodorant  Do not shave 48 hours prior to surgery.    Do not bring valuables to the hospital.  [facility] is not responsible  for any belongings or valuables.  If you are a smoker, DO NOT Smoke 24 hours prior to surgery IF you wear a CPAP at night please bring your mask, tubing, and machine the morning of surgery   Remember that you must have someone to transport you home after your surgery, and remain with you for 24 hours if you are discharged the same day.   Contacts, glasses, hearing aids, dentures or bridgework may not be worn into surgery.    Leave your suitcase in the car.  After surgery it may be brought to your room.  For patients admitted to the hospital, discharge time will be determined by your treatment team.  Patients  discharged the day of surgery will not be allowed to drive home.    Special instructions:   [facility]- Preparing For Surgery  Before surgery, you can play an important role. Because skin is not sterile, your skin needs to be as free of germs as possible. You can reduce the number of germs on your skin by washing with CHG (chlorahexidine gluconate) Soap before surgery.  CHG is an antiseptic cleaner which kills germs and bonds with the skin to continue killing germs even after washing.    Oral Hygiene is also important to reduce your risk of infection.  Remember - BRUSH YOUR TEETH THE MORNING OF SURGERY WITH YOUR REGULAR TOOTHPASTE  Please do not use if you have an allergy to CHG or antibacterial soaps. If your skin becomes reddened/irritated stop using the CHG.  Do not shave (including legs and underarms) for at least 48 hours prior to first CHG shower. It is OK to shave your face.  Please follow these instructions carefully.   1. Shower the NIGHT BEFORE SURGERY and the MORNING OF SURGERY with CHG Soap.   2. If you chose to wash your hair, wash your hair first as usual with your normal shampoo.  3. After you shampoo, rinse your hair and body thoroughly to remove the shampoo.  4. Use CHG as you would any other liquid soap. You can apply CHG directly to the skin and wash gently with a scrungie or a clean washcloth.   5. Apply the CHG Soap to your body ONLY FROM THE NECK DOWN.  Do not use on open wounds or open sores. Avoid contact with your eyes, ears, mouth and genitals (private parts). Wash Face and genitals (private parts)  with your normal soap.   6. Wash thoroughly, paying special attention to the area where your surgery will be performed.  7. Thoroughly rinse your body with warm water from the neck down.  8. DO NOT shower/wash with your normal soap after using and rinsing off the CHG Soap.  9. Pat yourself dry with a CLEAN TOWEL.  10. Wear CLEAN PAJAMAS to bed the night before  surgery, wear comfortable clothes the morning of surgery  11. Place CLEAN SHEETS on your bed the night of your first shower and DO NOT SLEEP WITH PETS.   Day of Surgery:  Do not apply any deodorants/lotions.  Please wear clean clothes to the hospital/surgery center.   Remember to brush your teeth WITH YOUR REGULAR TOOTHPASTE.   Please read over the following fact sheets that you were given.

## 2019-02-21 ENCOUNTER — Encounter (HOSPITAL_COMMUNITY)
Admission: RE | Admit: 2019-02-21 | Discharge: 2019-02-21 | Disposition: A | Discharge status: Home / Self Care | Payer: Medicare Other | Source: Ambulatory Visit | Attending: General Surgery | Admitting: General Surgery

## 2019-02-21 ENCOUNTER — Other Ambulatory Visit: Payer: Self-pay

## 2019-02-21 ENCOUNTER — Encounter (HOSPITAL_COMMUNITY): Payer: Self-pay

## 2019-02-21 DIAGNOSIS — Z01812 Encounter for preprocedural laboratory examination: Secondary | ICD-10-CM | POA: Diagnosis present

## 2019-02-21 DIAGNOSIS — C50919 Malignant neoplasm of unspecified site of unspecified female breast: Secondary | ICD-10-CM | POA: Insufficient documentation

## 2019-02-21 HISTORY — DX: Bronchitis, not specified as acute or chronic: J40

## 2019-02-21 HISTORY — DX: Unspecified osteoarthritis, unspecified site: M19.90

## 2019-02-21 HISTORY — DX: Unspecified fracture of left lower leg, initial encounter for closed fracture: S82.92XA

## 2019-02-21 HISTORY — DX: Cardiac murmur, unspecified: R01.1

## 2019-02-21 LAB — HEMOGLOBIN A1C
Hgb A1c MFr Bld: 7.3 % — ABNORMAL HIGH (ref 4.8–5.6)
Mean Plasma Glucose: 162.81 mg/dL

## 2019-02-21 LAB — CBC
HCT: 44.1 % (ref 36.0–46.0)
Hemoglobin: 13.5 g/dL (ref 12.0–15.0)
MCH: 29 pg (ref 26.0–34.0)
MCHC: 30.6 g/dL (ref 30.0–36.0)
MCV: 94.6 fL (ref 80.0–100.0)
Platelets: 318 K/uL (ref 150–400)
RBC: 4.66 MIL/uL (ref 3.87–5.11)
RDW: 14.3 % (ref 11.5–15.5)
WBC: 8.8 K/uL (ref 4.0–10.5)
nRBC: 0 % (ref 0.0–0.2)

## 2019-02-21 LAB — BASIC METABOLIC PANEL WITH GFR
Anion gap: 7 (ref 5–15)
BUN: 9 mg/dL (ref 6–20)
CO2: 27 mmol/L (ref 22–32)
Calcium: 9.3 mg/dL (ref 8.9–10.3)
Chloride: 105 mmol/L (ref 98–111)
Creatinine, Ser: 1.24 mg/dL — ABNORMAL HIGH (ref 0.44–1.00)
GFR calc Af Amer: 55 mL/min — ABNORMAL LOW
GFR calc non Af Amer: 48 mL/min — ABNORMAL LOW
Glucose, Bld: 122 mg/dL — ABNORMAL HIGH (ref 70–99)
Potassium: 4 mmol/L (ref 3.5–5.1)
Sodium: 139 mmol/L (ref 135–145)

## 2019-02-21 LAB — GLUCOSE, CAPILLARY: Glucose-Capillary: 190 mg/dL — ABNORMAL HIGH (ref 70–99)

## 2019-02-21 NOTE — Progress Notes (Signed)
PCP - Vinie Sill @ [facility] in [place] Cardiologist - na  Chest x-ray - na EKG - 01/03/19 Stress Test - na ECHO - na Cardiac Cath - na  Sleep Study - > 5 yrs. CPAP - not at present,states mask is broken  Fasting Blood Sugar - pt. Reports she doesn't check blood sugars Checks Blood Sugar _____ times a day  Blood Thinner Instructions: Aspirin Instructions:na  Anesthesia review:   Patient denies shortness of breath, fever, and chest pain at PAT appointment Pt. Reports a cough states it due to allergies.  Patient verbalized understanding of instructions that were given to them at the PAT appointment. Patient was also instructed that they will need to review over the PAT instructions again at home before surgery.

## 2019-02-21 NOTE — Progress Notes (Signed)
[facility] [address] [place]  [address] Phone: [phone] Fax: [phone]      Your procedure is scheduled on July 10th.  Report to [facility] "A" at 12:00 P.M., and check in at the Admitting office.  Call this number if you have problems the morning of surgery:  [phone]  Call [phone] if you have any questions prior to your surgery date Monday-Friday 8am-4pm    Remember:  Do not eat after midnight.             You may have clear liquids until 11:00A.M. the day of surgery. Clear liquids: water, juice ( non-citric and with out pulp), clear tea, black coffee only, carbonated beverages, gatorade.   Take these medicines the morning of surgery with A SIP OF WATER   Amlodipine (Norvasc)  Atorvastain (Lipitor)  Flonase - if needed  Hydrocodone-Acetaminophen - if needed  Claritin - if needed  Omeprazole (Prilosec)  Tizanidine (Zanaflex)    7 days prior to surgery STOP taking any Aspirin (unless otherwise instructed by your surgeon), Aleve, Naproxen, Ibuprofen, Motrin, Advil, Goody's, BC's, all herbal medications, fish oil, and all vitamins.   WHAT DO I DO ABOUT MY DIABETES MEDICATION?   Marland Kitchen Do not take oral diabetes medicines (pills) the morning of surgery. - Metformin    How to Manage Your Diabetes Before and After Surgery  Why is it important to control my blood sugar before and after surgery? . Improving blood sugar levels before and after surgery helps healing and can limit problems. . A way of improving blood sugar control is eating a healthy diet by: o  Eating less sugar and carbohydrates o  Increasing activity/exercise o  Talking with your doctor about reaching your blood sugar goals . High blood sugars (greater than 180 mg/dL) can raise your risk of infections and slow your recovery, so you will need to focus on controlling your diabetes during the weeks before surgery. . Make sure that the  doctor who takes care of your diabetes knows about your planned surgery including the date and location.  How do I manage my blood sugar before surgery? . Check your blood sugar at least 4 times a day, starting 2 days before surgery, to make sure that the level is not too high or low. o Check your blood sugar the morning of your surgery when you wake up and every 2 hours until you get to the Short Stay unit. . If your blood sugar is less than 70 mg/dL, you will need to treat for low blood sugar: o Do not take insulin. o Treat a low blood sugar (less than 70 mg/dL) with  cup of clear juice (cranberry or apple), 4 glucose tablets, OR glucose gel. o Recheck blood sugar in 15 minutes after treatment (to make sure it is greater than 70 mg/dL). If your blood sugar is not greater than 70 mg/dL on recheck, call [phone] for further instructions. . Report your blood sugar to the short stay nurse when you get to Short Stay.  . If you are admitted to the hospital after surgery: o Your blood sugar will be checked by the staff and you will probably be given insulin after surgery (instead of oral diabetes medicines) to make sure you have good blood sugar levels. o The goal for blood sugar control after surgery is 80-180 mg/dL.    The Morning of Surgery  Do not wear jewelry,  make-up or nail polish.  Do not wear lotions, powders, or perfumes, or deodorant  Do not shave 48 hours prior to surgery.    Do not bring valuables to the hospital.  [facility] is not responsible for any belongings or valuables.  If you are a smoker, DO NOT Smoke 24 hours prior to surgery IF you wear a CPAP at night please bring your mask, tubing, and machine the morning of surgery   Remember that you must have someone to transport you home after your surgery, and remain with you for 24 hours if you are discharged the same day.   Contacts, glasses, hearing aids, dentures or bridgework may not be worn into surgery.     Leave your suitcase in the car.  After surgery it may be brought to your room.  For patients admitted to the hospital, discharge time will be determined by your treatment team.  Patients discharged the day of surgery will not be allowed to drive home.    Special instructions:   [facility]- Preparing For Surgery  Before surgery, you can play an important role. Because skin is not sterile, your skin needs to be as free of germs as possible. You can reduce the number of germs on your skin by washing with CHG (chlorahexidine gluconate) Soap before surgery.  CHG is an antiseptic cleaner which kills germs and bonds with the skin to continue killing germs even after washing.    Oral Hygiene is also important to reduce your risk of infection.  Remember - BRUSH YOUR TEETH THE MORNING OF SURGERY WITH YOUR REGULAR TOOTHPASTE  Please do not use if you have an allergy to CHG or antibacterial soaps. If your skin becomes reddened/irritated stop using the CHG.  Do not shave (including legs and underarms) for at least 48 hours prior to first CHG shower. It is OK to shave your face.  Please follow these instructions carefully.   1. Shower the NIGHT BEFORE SURGERY and the MORNING OF SURGERY with CHG Soap.   2. If you chose to wash your hair, wash your hair first as usual with your normal shampoo.  3. After you shampoo, rinse your hair and body thoroughly to remove the shampoo.  4. Use CHG as you would any other liquid soap. You can apply CHG directly to the skin and wash gently with a scrungie or a clean washcloth.   5. Apply the CHG Soap to your body ONLY FROM THE NECK DOWN.  Do not use on open wounds or open sores. Avoid contact with your eyes, ears, mouth and genitals (private parts). Wash Face and genitals (private parts)  with your normal soap.   6. Wash thoroughly, paying special attention to the area where your surgery will be performed.  7. Thoroughly rinse your body with warm water from  the neck down.  8. DO NOT shower/wash with your normal soap after using and rinsing off the CHG Soap.  9. Pat yourself dry with a CLEAN TOWEL.  10. Wear CLEAN PAJAMAS to bed the night before surgery, wear comfortable clothes the morning of surgery  11. Place CLEAN SHEETS on your bed the night of your first shower and DO NOT SLEEP WITH PETS.   Day of Surgery:  Do not apply any deodorants/lotions.  Please wear clean clothes to the hospital/surgery center.   Remember to brush your teeth WITH YOUR REGULAR TOOTHPASTE.   Please read over the following fact sheets that you were given.

## 2019-02-24 NOTE — H&P (Signed)
Emma Casey Location: [facility] Patient #: [id number] DOB: 09/25/1960 Undefined / Language: [name] / Race: Black or African American Female        History of Present Illness       . This is a 58 year old female presenting for another postoperative visit and a preoperative visit for reoperative breast cancer surgery and Port-A-Cath insertion. Originally seen in the BMD see by Dr. Burr Medico, Dr. Lisbeth Renshaw, and me. 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      On Jan 08, 2019 she underwent left lumpectomy, reexcision medial margin, reexcision posterior margin, and left axillary deep sentinel lymph node biopsy. 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 ER 95%. PR 30%. HER-2 negative. Ki-67 20%. Stage TIc, N0. All 3 sentinel nodes were negative. DCIS is focally positive at the anterior margin DCIS is extremely close at the inferior margin DCIS is present at the reexcised medial margin Oncotype-DX score is 27, high risk, and Port-A-Cath and chemotherapy are advised and agreed upon       Genetic testing is negative       Comorbidities include CKD stage III, BMI 41, hypertension, diabetes, daily tobacco abuse, sleep apnea, history TAH and BSO family history positive for breast cancer in 3 cousins and 1 maternal aunt      We had a one-hour discussion about ultimate surgical planning. I discussed techniques for Port-A-Cath insertion. She is aware of the risk of bleeding, infection, pneumothorax, bilateral attempts, failure to insert, cardiac arrhythmia. She understands all of these issues and all of her questions were answered and she agrees with Port-A-Cath insertion We talked about management options for her breast cancer. Option #1 would be to simply reexcised to negative margins and then received postoperative chemotherapy and radiation therapy. Another option would be unilateral or bilateral mastectomy with or without reconstruction. She  wanted to discuss this at length. We even discussed the option of unilateral mastectomy with reconstruction and contralateral reduction. She had lots of questions. Ultimately she chose reexcision of margins and Port-A-Cath insertion in order to avoid the cosmetic defect and risk of bilateral mastectomy. I think this is wise considering her comorbidities. There is no survival benefit to mastectomy in her case.       She will be scheduled for insertion of Port-A-Cath with ultrasound, reexcision multiple margins of her lumpectomy incision, outpatient surgery    Allergies Aspirin *ANALGESICS - NonNarcotic*  Penicillins  NSAIDs   Medication History  Atorvastatin Calcium (20MG Tablet, Oral) Active. Fluticasone Propionate (50MCG/ACT Suspension, Nasal) Active. Lisinopril (2.5MG Tablet, Oral) Active. tiZANidine HCl (4MG Tablet, Oral) Active. Montelukast Sodium (10MG Tablet, Oral) Active. HYDROcodone-Acetaminophen (5-325MG Tablet, Oral) Active. Acyclovir (400MG Tablet, Oral) Active. Omeprazole (20MG Capsule DR, Oral) Active. Potassium Chloride ER (10MEQ Capsule ER, Oral) Active. Norvasc (5MG Tablet, Oral) Active. Vitamin C (1000MG Tablet, Oral) Active. ZyrTEC Allergy (10MG Tablet, Oral) Active. Cyanocobalamin (1000MCG Tablet Chewable, Oral) Active. Flonase (50MCG/DOSE Inhaler, Nasal) Active. Folic Acid (132GMW Tablet, Oral) Active. Glucotrol XL (5MG Tablet ER 24HR, Oral) Active. metFORMIN HCl ER (500MG Tablet ER 24HR, Oral) Active. PriLOSEC OTC (20MG Tablet DR, Oral) Active. Paxil (10MG Tablet, Oral) Active. Topamax (100MG Tablet, Oral) Active. Medications Reconciled  Vitals Weight: 267 lb Height: 66in Body Surface Area: 2.26 m Body Mass Index: 43.09 kg/m  Temp.: 98.23F (Oral)  Pulse: 88 (Regular)  BP: 132/64(Sitting, Left Arm, Standard)     Physical Exam  General Mental Status-Alert. General Appearance-Not in acute distress. Build &  Nutrition-Well  nourished. Posture-Normal posture. Gait-Normal.  Head and Neck Head-normocephalic, atraumatic with no lesions or palpable masses. Trachea-midline. Thyroid Gland Characteristics - normal size and consistency and no palpable nodules.  Chest and Lung Exam Chest and lung exam reveals -on auscultation, normal breath sounds, no adventitious sounds and normal vocal resonance.  Breast Note: Breasts are quite large, hypertrophied. Left lumpectomy incision superiorly looks very good. Soft. No hematoma infection or seroma left axillary incision soft healing well without seroma. Shoulder range of motion is excellent.   Cardiovascular Cardiovascular examination reveals -normal heart sounds, regular rate and rhythm with no murmurs and femoral artery auscultation bilaterally reveals normal pulses, no bruits, no thrills.  Abdomen Inspection Inspection of the abdomen reveals - No Hernias. Palpation/Percussion Palpation and Percussion of the abdomen reveal - Soft, Non Tender, No Rigidity (guarding), No hepatosplenomegaly and No Palpable abdominal masses.  Neurologic Neurologic evaluation reveals -alert and oriented x 3 with no impairment of recent or remote memory, normal attention span and ability to concentrate, normal sensation and normal coordination.  Musculoskeletal Normal Exam - Bilateral-Upper Extremity Strength Normal and Lower Extremity Strength Normal.    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 Burr Medico, and she has advised you to receive chemotherapy and you have agreed to that  We have discussed your pathology. In the left breast there was a positive margin anteriorly, positive margin medially, and a very close margin inferiorly fortunately all of your lymph nodes are negative for cancer  We have  discussed options for surgery One option would be simply reexcision of the 3 margins to see if we can get a negative margin. If we get a negative margin. We are done with surgery You would need chemotherapy and radiation therapy Another option you wanted to discuss was unilateral or bilateral mastectomy, with or without reconstruction or unilateral mastectomy with contralateral breast reduction In terms of cancer treatment these options are equivalent, however the cosmetic result and the complication rate with mastectomy will be much higher as we discussed All of these options can be considered At the end of our discussion and your preference was to have the lesser surgery, reexcision of margins  you will be scheduled for insertion of Port-A-Cath with ultrasound, reexcision multiple margins left lumpectomy site Dr. 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 [name], M.D., [facility] General and Minimally invasive Surgery Breast and Colorectal Surgery Office:   [phone] Pager:   [phone]

## 2019-02-26 ENCOUNTER — Other Ambulatory Visit (HOSPITAL_COMMUNITY)
Admission: RE | Admit: 2019-02-26 | Discharge: 2019-02-26 | Disposition: A | Discharge status: Home / Self Care | Payer: Medicare Other | Source: Ambulatory Visit | Attending: General Surgery | Admitting: General Surgery

## 2019-02-26 DIAGNOSIS — Z01812 Encounter for preprocedural laboratory examination: Secondary | ICD-10-CM | POA: Diagnosis present

## 2019-02-26 DIAGNOSIS — Z1159 Encounter for screening for other viral diseases: Secondary | ICD-10-CM | POA: Insufficient documentation

## 2019-02-26 LAB — SARS CORONAVIRUS 2 (TAT 6-24 HRS): SARS Coronavirus 2: NEGATIVE

## 2019-02-28 MED ORDER — DEXTROSE 5 % IV SOLN
3.0000 g | INTRAVENOUS | Status: AC
  Administered 2019-03-01: 3 g via INTRAVENOUS
  Filled 2019-02-28: qty 3

## 2019-03-01 ENCOUNTER — Ambulatory Visit (HOSPITAL_COMMUNITY): Payer: Medicare Other | Admitting: Physician Assistant

## 2019-03-01 ENCOUNTER — Encounter (HOSPITAL_COMMUNITY): Payer: Self-pay | Admitting: *Deleted

## 2019-03-01 ENCOUNTER — Ambulatory Visit (HOSPITAL_COMMUNITY)
Admission: RE | Admit: 2019-03-01 | Discharge: 2019-03-01 | Disposition: A | Discharge status: Home / Self Care | Payer: Medicare Other | Attending: General Surgery | Admitting: General Surgery

## 2019-03-01 ENCOUNTER — Ambulatory Visit (HOSPITAL_COMMUNITY): Payer: Medicare Other | Admitting: Registered Nurse

## 2019-03-01 ENCOUNTER — Other Ambulatory Visit: Payer: Self-pay

## 2019-03-01 ENCOUNTER — Encounter (HOSPITAL_COMMUNITY)
Admission: RE | Disposition: A | Discharge status: Home / Self Care | Payer: Self-pay | Source: Home / Self Care | Attending: General Surgery

## 2019-03-01 ENCOUNTER — Ambulatory Visit (HOSPITAL_COMMUNITY): Payer: Medicare Other

## 2019-03-01 DIAGNOSIS — F419 Anxiety disorder, unspecified: Secondary | ICD-10-CM | POA: Diagnosis not present

## 2019-03-01 DIAGNOSIS — M1990 Unspecified osteoarthritis, unspecified site: Secondary | ICD-10-CM | POA: Diagnosis not present

## 2019-03-01 DIAGNOSIS — E1122 Type 2 diabetes mellitus with diabetic chronic kidney disease: Secondary | ICD-10-CM | POA: Diagnosis not present

## 2019-03-01 DIAGNOSIS — Z6841 Body Mass Index (BMI) 40.0 and over, adult: Secondary | ICD-10-CM | POA: Insufficient documentation

## 2019-03-01 DIAGNOSIS — F17200 Nicotine dependence, unspecified, uncomplicated: Secondary | ICD-10-CM | POA: Insufficient documentation

## 2019-03-01 DIAGNOSIS — K219 Gastro-esophageal reflux disease without esophagitis: Secondary | ICD-10-CM | POA: Insufficient documentation

## 2019-03-01 DIAGNOSIS — C50912 Malignant neoplasm of unspecified site of left female breast: Secondary | ICD-10-CM | POA: Diagnosis present

## 2019-03-01 DIAGNOSIS — Z79899 Other long term (current) drug therapy: Secondary | ICD-10-CM | POA: Diagnosis not present

## 2019-03-01 DIAGNOSIS — Z880 Allergy status to penicillin: Secondary | ICD-10-CM | POA: Insufficient documentation

## 2019-03-01 DIAGNOSIS — Z7984 Long term (current) use of oral hypoglycemic drugs: Secondary | ICD-10-CM | POA: Insufficient documentation

## 2019-03-01 DIAGNOSIS — I129 Hypertensive chronic kidney disease with stage 1 through stage 4 chronic kidney disease, or unspecified chronic kidney disease: Secondary | ICD-10-CM | POA: Insufficient documentation

## 2019-03-01 DIAGNOSIS — C50212 Malignant neoplasm of upper-inner quadrant of left female breast: Secondary | ICD-10-CM | POA: Insufficient documentation

## 2019-03-01 DIAGNOSIS — Z419 Encounter for procedure for purposes other than remedying health state, unspecified: Secondary | ICD-10-CM

## 2019-03-01 DIAGNOSIS — Z20828 Contact with and (suspected) exposure to other viral communicable diseases: Secondary | ICD-10-CM | POA: Insufficient documentation

## 2019-03-01 DIAGNOSIS — Z95828 Presence of other vascular implants and grafts: Secondary | ICD-10-CM

## 2019-03-01 DIAGNOSIS — Z886 Allergy status to analgesic agent status: Secondary | ICD-10-CM | POA: Diagnosis not present

## 2019-03-01 DIAGNOSIS — N183 Chronic kidney disease, stage 3 (moderate): Secondary | ICD-10-CM | POA: Diagnosis not present

## 2019-03-01 DIAGNOSIS — Z803 Family history of malignant neoplasm of breast: Secondary | ICD-10-CM | POA: Diagnosis not present

## 2019-03-01 DIAGNOSIS — G4730 Sleep apnea, unspecified: Secondary | ICD-10-CM | POA: Insufficient documentation

## 2019-03-01 DIAGNOSIS — F329 Major depressive disorder, single episode, unspecified: Secondary | ICD-10-CM | POA: Diagnosis not present

## 2019-03-01 HISTORY — PX: PORTACATH PLACEMENT: SHX2246

## 2019-03-01 HISTORY — PX: RE-EXCISION OF BREAST LUMPECTOMY: SHX6048

## 2019-03-01 LAB — GLUCOSE, CAPILLARY
Glucose-Capillary: 146 mg/dL — ABNORMAL HIGH (ref 70–99)
Glucose-Capillary: 126 mg/dL — ABNORMAL HIGH (ref 70–99)

## 2019-03-01 SURGERY — EXCISION, LESION, BREAST
Anesthesia: General | Site: Chest

## 2019-03-01 MED ORDER — GLYCOPYRROLATE PF 0.2 MG/ML IJ SOSY
PREFILLED_SYRINGE | INTRAMUSCULAR | Status: DC | PRN
  Administered 2019-03-01 (×2): .1 mg via INTRAVENOUS

## 2019-03-01 MED ORDER — SODIUM CHLORIDE 0.9% FLUSH: 3.0000 mL | Freq: Two times a day (BID) | INTRAVENOUS | Status: DC

## 2019-03-01 MED ORDER — EPHEDRINE SULFATE 50 MG/ML IJ SOLN
INTRAMUSCULAR | Status: DC | PRN
  Administered 2019-03-01: 10 mg via INTRAVENOUS
  Administered 2019-03-01: 5 mg via INTRAVENOUS

## 2019-03-01 MED ORDER — FENTANYL CITRATE (PF) 250 MCG/5ML IJ SOLN
INTRAMUSCULAR | Status: DC | PRN
  Administered 2019-03-01 (×2): 50 ug via INTRAVENOUS
  Administered 2019-03-01 (×4): 25 ug via INTRAVENOUS

## 2019-03-01 MED ORDER — DEXAMETHASONE SODIUM PHOSPHATE 10 MG/ML IJ SOLN
INTRAMUSCULAR | Status: DC | PRN
  Administered 2019-03-01: 10 mg via INTRAVENOUS

## 2019-03-01 MED ORDER — 0.9 % SODIUM CHLORIDE (POUR BTL) OPTIME
TOPICAL | Status: DC | PRN
  Administered 2019-03-01: 14:00:00 1000 mL

## 2019-03-01 MED ORDER — CHLORHEXIDINE GLUCONATE CLOTH 2 % EX PADS: 6.0000 | MEDICATED_PAD | Freq: Once | CUTANEOUS | Status: DC

## 2019-03-01 MED ORDER — BUPIVACAINE-EPINEPHRINE 0.25% -1:200000 IJ SOLN
INTRAMUSCULAR | Status: DC | PRN
  Administered 2019-03-01: 30 mL

## 2019-03-01 MED ORDER — GABAPENTIN 300 MG PO CAPS: 300.0000 mg | ORAL_CAPSULE | ORAL | Status: DC

## 2019-03-01 MED ORDER — PROPOFOL 10 MG/ML IV BOLUS
INTRAVENOUS | Status: AC
  Filled 2019-03-01: qty 40

## 2019-03-01 MED ORDER — FENTANYL CITRATE (PF) 100 MCG/2ML IJ SOLN: 25.0000 ug | INTRAMUSCULAR | Status: DC | PRN

## 2019-03-01 MED ORDER — ACETAMINOPHEN 500 MG PO TABS
ORAL_TABLET | ORAL | Status: AC
  Administered 2019-03-01: 12:00:00 1000 mg
  Filled 2019-03-01: qty 2

## 2019-03-01 MED ORDER — PROPOFOL 10 MG/ML IV BOLUS
INTRAVENOUS | Status: DC | PRN
  Administered 2019-03-01: 30 mg via INTRAVENOUS
  Administered 2019-03-01: 200 mg via INTRAVENOUS

## 2019-03-01 MED ORDER — HEPARIN SOD (PORK) LOCK FLUSH 100 UNIT/ML IV SOLN
INTRAVENOUS | Status: DC | PRN
  Administered 2019-03-01: 500 [IU] via INTRAVENOUS

## 2019-03-01 MED ORDER — SODIUM CHLORIDE 0.9 % IV SOLN
INTRAVENOUS | Status: AC
  Filled 2019-03-01: qty 1.2

## 2019-03-01 MED ORDER — SODIUM CHLORIDE 0.9 % IV SOLN
INTRAVENOUS | Status: DC | PRN
  Administered 2019-03-01: 500 mL

## 2019-03-01 MED ORDER — ONDANSETRON HCL 4 MG/2ML IJ SOLN
INTRAMUSCULAR | Status: DC | PRN
  Administered 2019-03-01: 4 mg via INTRAVENOUS

## 2019-03-01 MED ORDER — ONDANSETRON HCL 4 MG/2ML IJ SOLN
INTRAMUSCULAR | Status: AC
  Filled 2019-03-01: qty 2

## 2019-03-01 MED ORDER — HYDROCODONE-ACETAMINOPHEN 5-325 MG PO TABS
ORAL_TABLET | ORAL | Status: AC
  Filled 2019-03-01: qty 1

## 2019-03-01 MED ORDER — GLYCOPYRROLATE PF 0.2 MG/ML IJ SOSY
PREFILLED_SYRINGE | INTRAMUSCULAR | Status: AC
  Filled 2019-03-01: qty 1

## 2019-03-01 MED ORDER — EPHEDRINE 5 MG/ML INJ
INTRAVENOUS | Status: AC
  Filled 2019-03-01: qty 10

## 2019-03-01 MED ORDER — LIDOCAINE 2% (20 MG/ML) 5 ML SYRINGE
INTRAMUSCULAR | Status: AC
  Filled 2019-03-01: qty 5

## 2019-03-01 MED ORDER — LACTATED RINGERS IV SOLN
INTRAVENOUS | Status: DC
  Administered 2019-03-01 (×2): via INTRAVENOUS

## 2019-03-01 MED ORDER — SODIUM CHLORIDE 0.9 % IV SOLN
INTRAVENOUS | Status: DC | PRN
  Administered 2019-03-01: 13:00:00 25 ug/min via INTRAVENOUS

## 2019-03-01 MED ORDER — GABAPENTIN 300 MG PO CAPS
ORAL_CAPSULE | ORAL | Status: AC
  Administered 2019-03-01: 300 mg
  Filled 2019-03-01: qty 1

## 2019-03-01 MED ORDER — HYDROCODONE-ACETAMINOPHEN 5-325 MG PO TABS
1.0000 | ORAL_TABLET | Freq: Once | ORAL | Status: AC
  Administered 2019-03-01: 16:00:00 1 via ORAL

## 2019-03-01 MED ORDER — BUPIVACAINE-EPINEPHRINE (PF) 0.25% -1:200000 IJ SOLN
INTRAMUSCULAR | Status: AC
  Filled 2019-03-01: qty 60

## 2019-03-01 MED ORDER — ACETAMINOPHEN 500 MG PO TABS: 1000.0000 mg | ORAL_TABLET | ORAL | Status: DC

## 2019-03-01 MED ORDER — LIDOCAINE 2% (20 MG/ML) 5 ML SYRINGE
INTRAMUSCULAR | Status: DC | PRN
  Administered 2019-03-01: 100 mg via INTRAVENOUS

## 2019-03-01 MED ORDER — HYDROCODONE-ACETAMINOPHEN 5-325 MG PO TABS: 1.0000 | ORAL_TABLET | Freq: Four times a day (QID) | ORAL | 0 refills | Status: DC | PRN

## 2019-03-01 MED ORDER — FENTANYL CITRATE (PF) 250 MCG/5ML IJ SOLN
INTRAMUSCULAR | Status: AC
  Filled 2019-03-01: qty 5

## 2019-03-01 MED ORDER — HEPARIN SOD (PORK) LOCK FLUSH 100 UNIT/ML IV SOLN
INTRAVENOUS | Status: AC
  Filled 2019-03-01: qty 5

## 2019-03-01 MED ORDER — MIDAZOLAM HCL 2 MG/2ML IJ SOLN
INTRAMUSCULAR | Status: AC
  Filled 2019-03-01: qty 2

## 2019-03-01 MED ORDER — ROCURONIUM BROMIDE 10 MG/ML (PF) SYRINGE
PREFILLED_SYRINGE | INTRAVENOUS | Status: AC
  Filled 2019-03-01: qty 10

## 2019-03-01 MED ORDER — MIDAZOLAM HCL 5 MG/5ML IJ SOLN
INTRAMUSCULAR | Status: DC | PRN
  Administered 2019-03-01: 2 mg via INTRAVENOUS

## 2019-03-01 SURGICAL SUPPLY — 47 items
ADH SKN CLS APL DERMABOND .7 (GAUZE/BANDAGES/DRESSINGS) ×2
APPLIER CLIP 9.375 MED OPEN (MISCELLANEOUS) ×3
APR CLP MED 9.3 20 MLT OPN (MISCELLANEOUS) ×1
BAG DECANTER FOR FLEXI CONT (MISCELLANEOUS) ×3 IMPLANT
BINDER BREAST 3XL (GAUZE/BANDAGES/DRESSINGS) ×3 IMPLANT
CANISTER SUCT 3000ML PPV (MISCELLANEOUS) ×3 IMPLANT
CHLORAPREP W/TINT 26 (MISCELLANEOUS) ×6 IMPLANT
CLIP APPLIE 9.375 MED OPEN (MISCELLANEOUS) ×2 IMPLANT
CONT SPEC 4OZ CLIKSEAL STRL BL (MISCELLANEOUS) ×12 IMPLANT
COVER SURGICAL LIGHT HANDLE (MISCELLANEOUS) ×6 IMPLANT
DECANTER SPIKE VIAL GLASS SM (MISCELLANEOUS) ×3 IMPLANT
DERMABOND ADVANCED (GAUZE/BANDAGES/DRESSINGS) ×2
DERMABOND ADVANCED .7 DNX12 (GAUZE/BANDAGES/DRESSINGS) ×4 IMPLANT
DRAPE C-ARM 42X72 X-RAY (DRAPES) ×3 IMPLANT
DRAPE CHEST BREAST 15X10 FENES (DRAPES) ×3 IMPLANT
DRAPE LAPAROSCOPIC ABDOMINAL (DRAPES) ×3 IMPLANT
DRSG PAD ABDOMINAL 8X10 ST (GAUZE/BANDAGES/DRESSINGS) ×3 IMPLANT
ELECT CAUTERY BLADE 6.4 (BLADE) ×3 IMPLANT
ELECT REM PT RETURN 9FT ADLT (ELECTROSURGICAL) ×3
ELECTRODE REM PT RTRN 9FT ADLT (ELECTROSURGICAL) ×2 IMPLANT
GAUZE SPONGE 4X4 12PLY STRL LF (GAUZE/BANDAGES/DRESSINGS) ×3 IMPLANT
GLOVE EUDERMIC 7 POWDERFREE (GLOVE) ×3 IMPLANT
GOWN STRL REUS W/ TWL LRG LVL3 (GOWN DISPOSABLE) ×2 IMPLANT
GOWN STRL REUS W/ TWL XL LVL3 (GOWN DISPOSABLE) ×2 IMPLANT
GOWN STRL REUS W/TWL LRG LVL3 (GOWN DISPOSABLE) ×2
GOWN STRL REUS W/TWL XL LVL3 (GOWN DISPOSABLE) ×2
KIT BASIN OR (CUSTOM PROCEDURE TRAY) ×3 IMPLANT
KIT MARKER MARGIN INK (KITS) ×3 IMPLANT
KIT PORT POWER 8FR ISP CVUE (Port) ×3 IMPLANT
KIT TURNOVER KIT B (KITS) ×3 IMPLANT
NEEDLE HYPO 25GX1X1/2 BEV (NEEDLE) ×3 IMPLANT
NS IRRIG 1000ML POUR BTL (IV SOLUTION) ×3 IMPLANT
PAD ARMBOARD 7.5X6 YLW CONV (MISCELLANEOUS) ×6 IMPLANT
PENCIL SMOKE EVACUATOR (MISCELLANEOUS) ×3 IMPLANT
POSITIONER HEAD DONUT 9IN (MISCELLANEOUS) ×3 IMPLANT
SPONGE LAP 4X18 RFD (DISPOSABLE) ×3 IMPLANT
SUT MNCRL AB 4-0 PS2 18 (SUTURE) ×6 IMPLANT
SUT PROLENE 2 0 CT2 30 (SUTURE) ×6 IMPLANT
SUT VIC AB 2-0 CT1 18 (SUTURE) ×3 IMPLANT
SUT VIC AB 3-0 SH 18 (SUTURE) ×3 IMPLANT
SYR 10ML LL (SYRINGE) ×3 IMPLANT
SYR 5ML LUER SLIP (SYRINGE) ×3 IMPLANT
TOWEL GREEN STERILE (TOWEL DISPOSABLE) ×3 IMPLANT
TOWEL GREEN STERILE FF (TOWEL DISPOSABLE) ×3 IMPLANT
TRAY LAPAROSCOPIC MC (CUSTOM PROCEDURE TRAY) ×3 IMPLANT
TUBE CONNECTING 12X1/4 (SUCTIONS) ×3 IMPLANT
YANKAUER SUCT BULB TIP NO VENT (SUCTIONS) ×3 IMPLANT

## 2019-03-01 NOTE — Op Note (Signed)
Patient Name:           Emma Casey   Date of Surgery:        03/01/2019  Pre op Diagnosis:      Invasive ductal carcinoma left breast, grade 3, stage T1c, N0                                       Focally positive anterior margin                                       Focally positive medial margin                                       Close inferior margin  Post op Diagnosis:    Same  Procedure:                 Insertion PowerPort [place] 8 French tunneled venous vascular access device                                       Use of fluoroscopy for guidance and positioning                                        Reexcision anterior margin, medial margin, and inferior margin left breast lumpectomy wound  Surgeon:                     Emma Casey. Emma Casey, M.D., FACS  Assistant:                      OR staff  Operative Indications:   This is a 58 year old female presenting for  a preoperative visit for reoperative breast cancer surgery and Port-A-Cath insertion. Originally seen in the [facility]  by Dr. Burr Casey, Dr. Lisbeth Casey, and me. Her PCP is Emma Casey.      On Jan 08, 2019 she underwent left lumpectomy, reexcision medial margin, reexcision posterior margin, and left axillary deep sentinel lymph node biopsy. 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 ER 95%. PR 30%. HER-2 negative. Ki-67 20%. Stage TIc, N0. All 3 sentinel nodes were negative. DCIS is focally positive at the anterior margin DCIS is extremely close at the inferior margin DCIS is present at the reexcised medial margin Oncotype-DX score is 27, high risk, and Port-A-Cath and chemotherapy are advised and agreed upon       Genetic testing is negative       Comorbidities include CKD stage III, BMI 41, hypertension, diabetes, daily tobacco abuse, sleep apnea, history TAH and BSO family history positive for breast cancer in 3 cousins and 1 maternal aunt      We had a one-hour discussion about  ultimate surgical planning. I discussed techniques for Port-A-Cath insertion. She is aware of the risk of bleeding, infection, pneumothorax, bilateral attempts, failure to insert, cardiac arrhythmia. She understands all of these issues and all of her questions were answered and she agrees with Port-A-Cath insertion We talked about  management options for her breast cancer. Option #1 would be to simply reexcise to negative margins and then received postoperative chemotherapy and radiation therapy. Another option would be unilateral or bilateral mastectomy with or without reconstruction. She wanted to discuss this at length. We even discussed the option of unilateral mastectomy with reconstruction and contralateral reduction. She had lots of questions. Ultimately she chose reexcision of margins and Port-A-Cath insertion in order to avoid the cosmetic defect and risk of bilateral mastectomy. I think this is wise considering her comorbidities. There is no survival benefit to mastectomy in her case, so long as we can achieve negative margins..       She will be scheduled for insertion of Port-A-Cath with ultrasound, reexcision multiple margins of her lumpectomy incision, outpatient surgery  Operative Findings:       We were able to insert the Port-A-Cath through the right subclavian vein uneventfully.  The catheter tip appears to be just above the cavoatrial junction and flushes well and has excellent blood return at the completion of the case.  Chest x-ray is pending.  The left breast lumpectomy wound was clean.  To resect the anterior margin we made an elliptical incision around the old scar so that the anterior margin was all the way out to the skin.  We took this down to the seroma cavity and then reexcised the inferior margin and medial margin aggressively.  Closure looked good  Procedure in Detail:          Following the induction of general LMA anesthesia the patient was positioned with her arms  at her sides.  The neck and chest were prepped and draped in a sterile fashion.  Surgical timeout was performed.  Intravenous antibiotics were given.  0.5% Marcaine with epinephrine was used as local infiltration anesthetic.     A right subclavian venipuncture was performed.  I got blood return on the first pass.  The guidewire threaded without difficulty.  Fluoroscopy confirmed that the wire was in the superior vena cava going into the right ventricle.  I made a small incision at the wire insertion site.  I drew a template on the chest wall to guide positioning of the catheter in length.  A transverse incision was made below the mid to medial right clavicle.  A deep subcutaneous pocket was created.  Using a tunneling device I passed the catheter from the wire insertion site to the port pocket site.  Using the template marked on the chest wall I measured and cut the catheter 25 cm in length.  The catheter was then secured to the port with the locking device and flushed with heparinized saline.  The port was sutured to the chest wall with 3 interrupted sutures of 2-0 Prolene.  The dilator and peel-away sheath assembly were inserted over the guidewire without difficulty.  The dilator and wire were removed and the catheter threaded and the peel-away sheath removed.  I had excellent blood return and the catheter flushed easily.  Fluoroscopy confirmed that the catheter tip was just above the cavoatrial junction and there was no deformity of the catheter anywhere along its course.  I flushed the port and catheter with concentrated heparin.  I closed the wounds with 3-0 Vicryl sutures and 4-0 subcuticular Monocryl and Dermabond.      I then reprepped and redraped with the arms out to the sides.  The left chest wall and breast were prepped and draped in a sterile fashion.  0.5% Marcaine with epinephrine  was used as a local infiltration anesthetic.  I decided that I would make a transverse elliptical incision to completely  excise the lumpectomy scar and in this way would reexcised the anterior margin.  This incision was made.  Dissection was carried down all the way down to the seroma cavity.  This was sent as a separate specimen.  I then resected the inferior margin, marked it with ink.  I then resected the medial margin and marked it with ink.  The wound was irrigated.  Bleeders were cauterized.  5 metal marker clips were placed in the walls of the lumpectomy cavity.  The lumpectomy cavity was closed transversely with interrupted 2-0 Vicryl and interrupted 3-0 Vicryl and then subcuticular 4-0 Monocryl and Dermabond.  Best binder was placed and the patient taken to PACU in stable condition.  EBL 25 cc.  Counts correct.  Complications none    Addendum: I logged onto the PMP aware website and reviewed her prescription medication history     Lydon Vansickle M. Emma Casey, M.D., FACS General and Minimally Invasive Surgery Breast and Colorectal Surgery  03/01/2019 2:42 PM

## 2019-03-01 NOTE — Discharge Instructions (Signed)
PORT-A-CATH: POST OP INSTRUCTIONS  Always review your discharge instruction sheet given to you by the facility where your surgery was performed.   1. A prescription for pain medication may be given to you upon discharge. Take your pain medication as prescribed, if needed. If narcotic pain medicine is not needed, then you make take acetaminophen (Tylenol) or ibuprofen (Advil) as needed.  2. Take your usually prescribed medications unless otherwise directed. 3. If you need a refill on your pain medication, please contact our office. All narcotic pain medicine now requires a paper prescription.  Phoned in and fax refills are no longer allowed by law.  Prescriptions will not be filled after 5 pm or on weekends.  4. You should follow a light diet for the remainder of the day after your procedure. 5. Most patients will experience some mild swelling and/or bruising in the area of the incision. It may take several days to resolve. 6. It is common to experience some constipation if taking pain medication after surgery. Increasing fluid intake and taking a stool softener (such as Colace) will usually help or prevent this problem from occurring. A mild laxative (Milk of Magnesia or Miralax) should be taken according to package directions if there are no bowel movements after 48 hours.  7. Unless discharge instructions indicate otherwise, you may remove your bandages 48 hours after surgery, and you may shower at that time. You may have steri-strips (small white skin tapes) in place directly over the incision.  These strips should be left on the skin for 7-10 days.  If your surgeon used Dermabond (skin glue) on the incision, you may shower in 24 hours.  The glue will flake off over the next 2-3 weeks.  8. If your port is left accessed at the end of surgery (needle left in port), the dressing cannot get wet and should only by changed by a healthcare professional. When the port is no longer accessed (when the  needle has been removed), follow step 7.   9. ACTIVITIES:  Limit activity involving your arms for the next 72 hours. Do no strenuous exercise or activity for 1 week. You may drive when you are no longer taking prescription pain medication, you can comfortably wear a seatbelt, and you can maneuver your car. 10.You may need to see your doctor in the office for a follow-up appointment.  Please       check with your doctor.  11.When you receive a new Port-a-Cath, you will get a product guide and        ID card.  Please keep them in case you need them.  WHEN TO CALL YOUR DOCTOR [phone]): 1. Fever over 101.0 2. Chills 3. Continued bleeding from incision 4. Increased redness and tenderness at the site 5. Shortness of breath, difficulty breathing   The clinic staff is available to answer your questions during regular business hours. Please dont hesitate to call and ask to speak to one of the nurses or medical assistants for clinical concerns. If you have a medical emergency, go to the nearest emergency room or call 911.  A surgeon from [facility] is always on call at the hospital.     For further information, please visit [email or web address]        PORT-A-CATH: POST OP INSTRUCTIONS  Always review your discharge instruction sheet given to you by the facility where your surgery was performed.   10. A prescription for pain medication may be given to you upon  discharge. Take your pain medication as prescribed, if needed. If narcotic pain medicine is not needed, then you make take acetaminophen (Tylenol) or ibuprofen (Advil) as needed.  11. Take your usually prescribed medications unless otherwise directed. 12. If you need a refill on your pain medication, please contact our office. All narcotic pain medicine now requires a paper prescription.  Phoned in and fax refills are no longer allowed by law.  Prescriptions will not be filled after 5 pm or on weekends.   13. You should follow a light diet for the remainder of the day after your procedure. 14. Most patients will experience some mild swelling and/or bruising in the area of the incision. It may take several days to resolve. 15. It is common to experience some constipation if taking pain medication after surgery. Increasing fluid intake and taking a stool softener (such as Colace) will usually help or prevent this problem from occurring. A mild laxative (Milk of Magnesia or Miralax) should be taken according to package directions if there are no bowel movements after 48 hours.  16. Unless discharge instructions indicate otherwise, you may remove your bandages 48 hours after surgery, and you may shower at that time. You may have steri-strips (small white skin tapes) in place directly over the incision.  These strips should be left on the skin for 7-10 days.  If your surgeon used Dermabond (skin glue) on the incision, you may shower in 24 hours.  The glue will flake off over the next 2-3 weeks.  17. If your port is left accessed at the end of surgery (needle left in port), the dressing cannot get wet and should only by changed by a healthcare professional. When the port is no longer accessed (when the needle has been removed), follow step 7.   18. ACTIVITIES:  Limit activity involving your arms for the next 72 hours. Do no strenuous exercise or activity for 1 week. You may drive when you are no longer taking prescription pain medication, you can comfortably wear a seatbelt, and you can maneuver your car. 10.You may need to see your doctor in the office for a follow-up appointment.  Please       check with your doctor.  11.When you receive a new Port-a-Cath, you will get a product guide and        ID card.  Please keep them in case you need them.  WHEN TO CALL YOUR DOCTOR [phone]): 6. Fever over 101.0 7. Chills 8. Continued bleeding from incision 9. Increased redness and tenderness at the  site 10. Shortness of breath, difficulty breathing   The clinic staff is available to answer your questions during regular business hours. Please dont hesitate to call and ask to speak to one of the nurses or medical assistants for clinical concerns. If you have a medical emergency, go to the nearest emergency room or call 911.  A surgeon from [facility] is always on call at the hospital.     For further information, please visit [email or web address]             Managing Your Pain After Surgery Without Opioids    Thank you for participating in our program to help patients manage their pain after surgery without opioids. This is part of our effort to provide you with the best care possible, without exposing you or your family to the risk that opioids pose.  What pain can I expect after surgery? You can expect to have some pain  after surgery. This is normal. The pain is typically worse the day after surgery, and quickly begins to get better. Many studies have found that many patients are able to manage their pain after surgery with Over-the-Counter (OTC) medications such as Tylenol and Motrin. If you have a condition that does not allow you to take Tylenol or Motrin, notify your surgical team.  How will I manage my pain? The best strategy for controlling your pain after surgery is around the clock pain control with Tylenol (acetaminophen) and Motrin (ibuprofen or Advil). Alternating these medications with each other allows you to maximize your pain control. In addition to Tylenol and Motrin, you can use heating pads or ice packs on your incisions to help reduce your pain.  How will I alternate your regular strength over-the-counter pain medication? You will take a dose of pain medication every three hours. ; Start by taking 650 mg of Tylenol (2 pills of 325 mg) ; 3 hours later take 600 mg of Motrin (3 pills of 200 mg) ; 3 hours after taking the  Motrin take 650 mg of Tylenol ; 3 hours after that take 600 mg of Motrin.   - 1 -  See example - if your first dose of Tylenol is at 12:00 PM   12:00 PM Tylenol 650 mg (2 pills of 325 mg)  3:00 PM Motrin 600 mg (3 pills of 200 mg)  6:00 PM Tylenol 650 mg (2 pills of 325 mg)  9:00 PM Motrin 600 mg (3 pills of 200 mg)  Continue alternating every 3 hours   We recommend that you follow this schedule around-the-clock for at least 3 days after surgery, or until you feel that it is no longer needed. Use the table on the last page of this handout to keep track of the medications you are taking. Important: Do not take more than 3000mg  of Tylenol or 3200mg  of Motrin in a 24-hour period. Do not take ibuprofen/Motrin if you have a history of bleeding stomach ulcers, severe kidney disease, &/or actively taking a blood thinner  What if I still have pain? If you have pain that is not controlled with the over-the-counter pain medications (Tylenol and Motrin or Advil) you might have what we call breakthrough pain. You will receive a prescription for a small amount of an opioid pain medication such as Oxycodone, Tramadol, or Tylenol with Codeine. Use these opioid pills in the first 24 hours after surgery if you have breakthrough pain. Do not take more than 1 pill every 4-6 hours.  If you still have uncontrolled pain after using all opioid pills, don't hesitate to call our staff using the number provided. We will help make sure you are managing your pain in the best way possible, and if necessary, we can provide a prescription for additional pain medication.   Day 1    Time  Name of Medication Number of pills taken  Amount of Acetaminophen  Pain Level   Comments  AM PM       AM PM       AM PM       AM PM       AM PM       AM PM       AM PM       AM PM       Total Daily amount of Acetaminophen Do not take more than  3,000 mg per day      Day 2  Time  Name of Medication Number of  pills taken  Amount of Acetaminophen  Pain Level   Comments  AM PM       AM PM       AM PM       AM PM       AM PM       AM PM       AM PM       AM PM       Total Daily amount of Acetaminophen Do not take more than  3,000 mg per day      Day 3    Time  Name of Medication Number of pills taken  Amount of Acetaminophen  Pain Level   Comments  AM PM       AM PM       AM PM       AM PM          AM PM       AM PM       AM PM       AM PM       Total Daily amount of Acetaminophen Do not take more than  3,000 mg per day      Day 4    Time  Name of Medication Number of pills taken  Amount of Acetaminophen  Pain Level   Comments  AM PM       AM PM       AM PM       AM PM       AM PM       AM PM       AM PM       AM PM       Total Daily amount of Acetaminophen Do not take more than  3,000 mg per day      Day 5    Time  Name of Medication Number of pills taken  Amount of Acetaminophen  Pain Level   Comments  AM PM       AM PM       AM PM       AM PM       AM PM       AM PM       AM PM       AM PM       Total Daily amount of Acetaminophen Do not take more than  3,000 mg per day       Day 6    Time  Name of Medication Number of pills taken  Amount of Acetaminophen  Pain Level  Comments  AM PM       AM PM       AM PM       AM PM       AM PM       AM PM       AM PM       AM PM       Total Daily amount of Acetaminophen Do not take more than  3,000 mg per day      Day 7    Time  Name of Medication Number of pills taken  Amount of Acetaminophen  Pain Level   Comments  AM PM       AM PM       AM PM       AM PM  AM PM       AM PM       AM PM       AM PM       Total Daily amount of Acetaminophen Do not take more than  3,000 mg per day        For additional information about how and where to safely dispose of unused opioid medications - [employer]  Disclaimer: This document contains  information and/or instructional materials adapted from [place] for the typical patient with your condition. It does not replace medical advice from your health care provider because your experience may differ from that of the typical patient. Talk to your health care provider if you have any questions about this document, your condition or your treatment plan. Adapted from [place] Office Phone Number [phone]          BREAST BIOPSY/ PARTIAL MASTECTOMY: POST OP INSTRUCTIONS  Always review your discharge instruction sheet given to you by the facility where your surgery was performed.  IF YOU HAVE DISABILITY OR FAMILY LEAVE FORMS, YOU MUST BRING THEM TO THE OFFICE FOR PROCESSING.  DO NOT GIVE THEM TO YOUR DOCTOR.  1. A prescription for pain medication may be given to you upon discharge.  Take your pain medication as prescribed, if needed.  If narcotic pain medicine is not needed, then you may take acetaminophen (Tylenol) or ibuprofen (Advil) as needed. 2. Take your usually prescribed medications unless otherwise directed 3. If you need a refill on your pain medication, please contact your pharmacy.  They will contact our office to request authorization.  Prescriptions will not be filled after 5pm or on week-ends. 4. You should eat very light the first 24 hours after surgery, such as soup, crackers, pudding, etc.  Resume your normal diet the day after surgery. 5. Most patients will experience some swelling and bruising in the breast.  Ice packs and a good support bra will help.  Swelling and bruising can take several days to resolve.  6. It is common to experience some constipation if taking pain medication after surgery.  Increasing fluid intake and taking a stool softener will usually help or prevent this problem from occurring.  A mild laxative (Milk of Magnesia or Miralax) should be taken according to package directions if  there are no bowel movements after 48 hours. 7. Unless discharge instructions indicate otherwise, you may remove your bandages 24-48 hours after surgery, and you may shower at that time.  You may have steri-strips (small skin tapes) in place directly over the incision.  These strips should be left on the skin for 7-10 days.  If your surgeon used skin glue on the incision, you may shower in 24 hours.  The glue will flake off over the next 2-3 weeks.  Any sutures or staples will be removed at the office during your follow-up visit. 8. ACTIVITIES:  You may resume regular daily activities (gradually increasing) beginning the next day.  Wearing a good support bra or sports bra minimizes pain and swelling.  You may have sexual intercourse when it is comfortable. a. You may drive when you no longer are taking prescription pain medication, you can comfortably wear a seatbelt, and you can safely maneuver your car and apply brakes. b. RETURN TO WORK:  ______________________________________________________________________________________ 9. You should see your doctor in the office for a follow-up appointment approximately two weeks after your surgery.  Your doctors  nurse will typically make your follow-up appointment when she calls you with your pathology report.  Expect your pathology report 2-3 business days after your surgery.  You may call to check if you do not hear from [place] after three days. 10. OTHER INSTRUCTIONS: _______________________________________________________________________________________________ _____________________________________________________________________________________________________________________________________ _____________________________________________________________________________________________________________________________________ _____________________________________________________________________________________________________________________________________  WHEN  TO CALL YOUR DOCTOR: 1. Fever over 101.0 2. Nausea and/or vomiting. 3. Extreme swelling or bruising. 4. Continued bleeding from incision. 5. Increased pain, redness, or drainage from the incision.  The clinic staff is available to answer your questions during regular business hours.  Please dont hesitate to call and ask to speak to one of the nurses for clinical concerns.  If you have a medical emergency, go to the nearest emergency room or call 911.  A surgeon from [facility] is always on call at the hospital.  For further questions, please visit [email or web address]

## 2019-03-01 NOTE — Anesthesia Preprocedure Evaluation (Addendum)
Anesthesia Evaluation  Patient identified by MRN, date of birth, ID band Patient awake    Reviewed: Allergy & Precautions, H&P , NPO status , Patient's Chart, lab work & pertinent test results, reviewed documented beta blocker date and time   Airway Mallampati: I  TM Distance: >3 FB Neck ROM: full    Dental  (+) Edentulous Upper, Edentulous Lower   Pulmonary sleep apnea and Continuous Positive Airway Pressure Ventilation , Current Smoker,    Pulmonary exam normal breath sounds clear to auscultation       Cardiovascular Exercise Tolerance: Good hypertension, Pt. on medications and Pt. on home beta blockers negative cardio ROS   Rhythm:regular Rate:Normal     Neuro/Psych  Headaches, PSYCHIATRIC DISORDERS Anxiety Depression    GI/Hepatic Neg liver ROS, GERD  Medicated and Controlled,  Endo/Other  diabetesMorbid obesity  Renal/GU negative Renal ROS  negative genitourinary   Musculoskeletal  (+) Arthritis ,   Abdominal   Peds  Hematology negative hematology ROS (+)   Anesthesia Other Findings   Reproductive/Obstetrics negative OB ROS                            Anesthesia Physical  Anesthesia Plan  ASA: III  Anesthesia Plan: General   Post-op Pain Management:    Induction: Intravenous  PONV Risk Score and Plan: 3 and Ondansetron, Treatment may vary due to age or medical condition and Midazolam  Airway Management Planned: LMA  Additional Equipment:   Intra-op Plan:   Post-operative Plan: Extubation in OR  Informed Consent: I have reviewed the patients History and Physical, chart, labs and discussed the procedure including the risks, benefits and alternatives for the proposed anesthesia with the patient or authorized representative who has indicated his/her understanding and acceptance.     Dental Advisory Given  Plan Discussed with: CRNA and Anesthesiologist  Anesthesia Plan  Comments: (  )        Anesthesia Quick Evaluation

## 2019-03-01 NOTE — Interval H&P Note (Signed)
History and Physical Interval Note:  03/01/2019 12:27 PM  Emma Casey  has presented today for surgery, with the diagnosis of LEFT BREAST CANCER.  The various methods of treatment have been discussed with the patient and family. After consideration of risks, benefits and other options for treatment, the patient has consented to  Procedure(s): RE-EXCISION OF LEFT BREAST LUMPECTOMY MULITPLE MARGINS (Left) INSERTION PORT-A-CATH WITH ULTRASOUND (N/A) as a surgical intervention.  The patient's history has been reviewed, patient examined, no change in status, stable for surgery.  I have reviewed the patient's chart and labs.  Questions were answered to the patient's satisfaction.     Adin Hector

## 2019-03-01 NOTE — Anesthesia Procedure Notes (Signed)
Procedure Name: LMA Insertion Date/Time: 03/01/2019 1:09 PM Performed by: Jearld Pies, CRNA Pre-anesthesia Checklist: Patient identified, Emergency Drugs available, Suction available and Patient being monitored Patient Re-evaluated:Patient Re-evaluated prior to induction Oxygen Delivery Method: Circle System Utilized Preoxygenation: Pre-oxygenation with 100% oxygen Induction Type: IV induction LMA: LMA inserted LMA Size: 4.0 Number of attempts: 1 Airway Equipment and Method: Bite block Placement Confirmation: positive ETCO2 Tube secured with: Tape Dental Injury: Teeth and Oropharynx as per pre-operative assessment

## 2019-03-01 NOTE — Transfer of Care (Signed)
Immediate Anesthesia Transfer of Care Note  Patient: Emma Casey  Procedure(s) Performed: RE-EXCISION OF LEFT BREAST LUMPECTOMY MULITPLE MARGINS (Left Breast) INSERTION PORT-A-CATH (N/A Chest)  Patient Location: PACU  Anesthesia Type:General  Level of Consciousness: awake, alert  and oriented  Airway & Oxygen Therapy: Patient Spontanous Breathing and Patient connected to face mask oxygen  Post-op Assessment: Report given to RN and Post -op Vital signs reviewed and stable  Post vital signs: Reviewed and stable  Last Vitals:  Vitals Value Taken Time  BP 138/75 03/01/19 1452  Temp    Pulse 91 03/01/19 1452  Resp 17 03/01/19 1452  SpO2 100 % 03/01/19 1452  Vitals shown include unvalidated device data.  Last Pain:  Vitals:   03/01/19 1152  TempSrc:   PainSc: 0-No pain         Complications: No apparent anesthesia complications

## 2019-03-02 NOTE — Anesthesia Postprocedure Evaluation (Signed)
Anesthesia Post Note  Patient: Aradia Estey  Procedure(s) Performed: RE-EXCISION OF LEFT BREAST LUMPECTOMY MULITPLE MARGINS (Left Breast) INSERTION PORT-A-CATH (N/A Chest)     Patient location during evaluation: PACU Anesthesia Type: General Level of consciousness: awake and alert Pain management: pain level controlled Vital Signs Assessment: post-procedure vital signs reviewed and stable Respiratory status: spontaneous breathing, nonlabored ventilation, respiratory function stable and patient connected to nasal cannula oxygen Cardiovascular status: blood pressure returned to baseline and stable Postop Assessment: no apparent nausea or vomiting Anesthetic complications: no    Last Vitals:  Vitals:   03/01/19 1537 03/01/19 1617  BP: (!) 148/74 (!) 144/98  Pulse: 72 61  Resp: 14 14  Temp:  (!) 36.3 C  SpO2: 98% 98%    Last Pain:  Vitals:   03/01/19 1615  TempSrc:   PainSc: 4                  Ulah Olmo S

## 2019-03-04 ENCOUNTER — Encounter (HOSPITAL_COMMUNITY): Payer: Self-pay | Admitting: General Surgery

## 2019-03-08 NOTE — Progress Notes (Signed)
Emma Casey   [phone] [phone]   Clinic Follow up Note   Patient Care Team: Sandi Mealy, MD as PCP - General (Family Medicine) Mauro Kaufmann, RN as Oncology Nurse Navigator Rockwell Germany, RN as Oncology Nurse Navigator Fanny Skates, MD as Consulting Physician (General Surgery) Truitt Merle, MD as Consulting Physician (Hematology) Kyung Rudd, MD as Consulting Physician (Radiation Oncology)  Date of Service:  03/14/2019  CHIEF COMPLAINT: f/u of left breast cancer   SUMMARY OF ONCOLOGIC HISTORY: Oncology History Overview Note  Cancer Staging Malignant neoplasm of upper-inner quadrant of left breast in female, estrogen receptor positive ([place]) Staging form: Breast, AJCC 8th Edition - Clinical stage from 12/17/2018: Stage Unknown (cTX, cN0, cM0, G2, ER+, PR+, HER2-) - Signed by Truitt Merle, MD on 12/25/2018 - Pathologic stage from 01/08/2019: Stage IA (pT1c, pN0, cM0, G3, ER+, PR+, HER2-) - Signed by Truitt Merle, MD on 01/22/2019     Malignant neoplasm of upper-inner quadrant of left breast in female, estrogen receptor positive ([place])  11/28/2018 Mammogram   Diagnostic Mammogram 11/28/18 IMPRESSION:  Suspcious mass in upper inner quadrant of the left breast  at posterior depth.    12/17/2018 Initial Biopsy   Diagnosis 12/17/18 Breast, left, needle core biopsy, upper inner quadrant - INVASIVE DUCTAL CARCINOMA, SEE COMMENT. - DUCTAL CARCINOMA IN SITU.   12/17/2018 Cancer Staging   Staging form: Breast, AJCC 8th Edition - Clinical stage from 12/17/2018: Stage Unknown (cTX, cN0, cM0, G2, ER+, PR+, HER2-) - Signed by Truitt Merle, MD on 12/25/2018   12/20/2018 Initial Diagnosis   Malignant neoplasm of upper-inner quadrant of left breast in female, estrogen receptor positive ([place])   12/20/2018 Receptors her2   Results: IMMUNOHISTOCHEMICAL AND MORPHOMETRIC ANALYSIS PERFORMED MANUALLY The tumor cells are NEGATIVE for Her2 (1+). Estrogen Receptor: 95%,  POSITIVE, STRONG STAINING INTENSITY Progesterone Receptor: 30%, POSITIVE, STRONG STAINING INTENSITY Proliferation Marker Ki67: 20%   01/08/2019 Surgery   LEFT BREAST LUMPECTOMY WITH RADIOACTIVE SEED AND LEFT AXILLARY DEEP SENTINEL LYMPH NODE BIOPSY WITH BLUE DYE INJECTION by Dr [name] 01/08/19    01/08/2019 Pathology Results   Diagnosis 01/08/19 1. Breast, lumpectomy, left - INVASIVE DUCTAL CARCINOMA, GRADE III/III, SPANNING 1.8 CM. - DUCTAL CARCINOMA IN SITU, HIGH GRADE. - DUCTAL CARCINOMA IN SITU IS FOCALLY PRESENT AT THE ANTERIOR MARGIN OF SPECIMEN #1. - DUCTAL CARCINOMA IN SITU IS FOCALLY LESS THAN 0.1 CM FROM THE INFERIOR MARGIN OF SPECIMEN # 1. - SEE ONCOLOGY TABLE BELOW. 2. Breast, excision, left medial margin - DUCTAL CARCINOMA IN SITU, FOCALLY PRESENT AT THE MEDIAL MARGIN OF SPECIMEN # 2. 3. Breast, excision, left posterior margin - BENIGN BREAST PARENCHYMA. - THERE IS NO EVIDENCE OF MALIGNANCY. - SEE COMMENT. 4. Lymph node, sentinel, biopsy, left axillary - THERE IS NO EVIDENCE OF CARCINOMA IN 1 OF 1 LYMPH NODE (0/1). 5. Lymph node, sentinel, biopsy, left axillary - THERE IS NO EVIDENCE OF CARCINOMA IN 1 OF 1 LYMPH NODE (0/1). 6. Lymph node, sentinel, biopsy, left axillary - THERE IS NO EVIDENCE OF CARCINOMA IN 1 OF 1 LYMPH NODE (0/1)   01/08/2019 Oncotype testing   recurrence score of 27 with risk of recurrence 16% with Tamoxifen or AI alone. There is a benefit of chemotherapy >15%    01/08/2019 Cancer Staging   Staging form: Breast, AJCC 8th Edition - Pathologic stage from 01/08/2019: Stage IA (pT1c, pN0, cM0, G3, ER+, PR+, HER2-) - Signed by Truitt Merle, MD on 01/22/2019   01/29/2019 Genetic Testing  Negative genetic testing on the common hereditary cancer panel.  The Common Hereditary Gene Panel offered by [facility] includes sequencing and/or deletion duplication testing of the following 48 genes: APC, ATM, AXIN2, BARD1, BMPR1A, BRCA1, BRCA2, BRIP1, CDH1, CDK4, CDKN2A (p14ARF),  CDKN2A (p16INK4a), CHEK2, CTNNA1, DICER1, EPCAM (Deletion/duplication testing only), GREM1 (promoter region deletion/duplication testing only), KIT, MEN1, MLH1, MSH2, MSH3, MSH6, MUTYH, NBN, NF1, NHTL1, PALB2, PDGFRA, PMS2, POLD1, POLE, PTEN, RAD50, RAD51C, RAD51D, RNF43, SDHB, SDHC, SDHD, SMAD4, SMARCA4. STK11, TP53, TSC1, TSC2, and VHL.  The following genes were evaluated for sequence changes only: SDHA and HOXB13 c.251G>A variant only. The report date is January 29, 2019.   03/01/2019 Surgery   RE-EXCISION OF LEFT BREAST LUMPECTOMY MULITPLE MARGINS by Dr. [name]  Diagnosis 1. Breast, excision, re-excision left anterior margin - BENIGN BREAST PARENCHYMA WITH PREVIOUS PROCEDURE-RELATED CHANGES - NEGATIVE FOR IN SITU OR INVASIVE CARCINOMA 2. 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 SEE NOTE - DCIS IS FOCALLY LESS THAN 1 MM FROM NEW INFERIOR MARGIN - NEGATIVE FOR INVASIVE CARCINOMA - PREVIOUS PROCEDURE-RELATED CHANGES 3. Breast, excision, re-excision left medial margin - FOCAL DUCTAL CARCINOMA IN SITU (DCIS), 0.2 CM. SEE NOTE - DCIS IS 1.5 MM FROM NEW MEDIAL MARGIN - NEGATIVE FOR INVASIVE CARCINOMA - PREVIOUS PROCEDURE-RELATED CHANGES 4. Breast, excision, re-excision left final medial argin - BENIGN BREAST PARENCHYMA WITH MILD FOCAL PREVIOUS PROCEDURE-RELATED CHANGES - NEGATIVE FOR IN SITU OR INVASIVE CARCINOMA     Chemotherapy   TC q3weeks for 4 cycles starting in 1-2 weeks     03/28/2019 -  Chemotherapy   The patient had palonosetron (ALOXI) injection 0.25 mg, 0.25 mg, Intravenous,  Once, 0 of 4 cycles pegfilgrastim-jmdb (FULPHILA) injection 6 mg, 6 mg, Subcutaneous,  Once, 0 of 4 cycles cyclophosphamide (CYTOXAN) 1,420 mg in sodium chloride 0.9 % 250 mL chemo infusion, 600 mg/m2 = 1,420 mg, Intravenous,  Once, 0 of 4 cycles DOCEtaxel (TAXOTERE) 180 mg in sodium chloride 0.9 % 250 mL chemo infusion, 75 mg/m2 = 180 mg, Intravenous,   Once, 0 of 4 cycles  for chemotherapy treatment.       CURRENT THERAPY:  TC q3weeks for 4 cycles starting in 1-2 weeks   INTERVAL HISTORY:  Emma Casey is here for a follow up post re-excision surgery. She presents to the clinic alone. She notes she has been experiencing significant shooting left breast pain. She notes she is eating adequately and she is able to still do her usual activities. She will f/u with her surgeon on 8/3.     REVIEW OF SYSTEMS:   Constitutional: Denies fevers, chills or abnormal weight loss Eyes: Denies blurriness of vision Ears, nose, mouth, throat, and face: Denies mucositis or sore throat Respiratory: Denies cough, dyspnea or wheezes Cardiovascular: Denies palpitation, chest discomfort or lower extremity swelling Gastrointestinal:  Denies nausea, heartburn or change in bowel habits Skin: Denies abnormal skin rashes Lymphatics: Denies new lymphadenopathy or easy bruising Neurological:Denies numbness, tingling or new weaknesses Behavioral/Psych: Mood is stable, no new changes  Breast: (+) Left breast shooting pain All other systems were reviewed with the patient and are negative.  MEDICAL HISTORY:  Past Medical History:  Diagnosis Date  . Anxiety   . Arthritis    right hand  . Bronchitis   . Cancer ([place]) 11/2018   left breast ca  . Depression   . Diabetes mellitus without complication ([place])   . Family history of breast cancer   . GERD (  gastroesophageal reflux disease)   . Headache   . Heart murmur   . Herpes 1&2   . Hypertension   . Leg fracture, left    as a child  . OSA on CPAP    has CPAP but has not used in over a month    SURGICAL HISTORY: Past Surgical History:  Procedure Laterality Date  . ABDOMINAL HYSTERECTOMY    . BREAST LUMPECTOMY WITH RADIOACTIVE SEED AND SENTINEL LYMPH NODE BIOPSY Left 01/08/2019   Procedure: LEFT BREAST LUMPECTOMY WITH RADIOACTIVE SEED AND LEFT AXILLARY DEEP SENTINEL LYMPH NODE BIOPSY WITH BLUE DYE  INJECTION;  Surgeon: Fanny Skates, MD;  Location: [facility];  Service: General;  Laterality: Left;  . COLONOSCOPY N/A 09/02/2015   Procedure: COLONOSCOPY;  Surgeon: Danie Binder, MD;  [facility];  Service: Endoscopy;  Laterality: N/A;  11:15 Am  . EYE SURGERY Right    cataract  . HAND SURGERY Right    broken bone  . PORTACATH PLACEMENT N/A 03/01/2019   Procedure: INSERTION PORT-A-CATH;  Surgeon: Fanny Skates, MD;  Location: [facility];  Service: General;  Laterality: N/A;  . RE-EXCISION OF BREAST LUMPECTOMY Left 03/01/2019   Procedure: RE-EXCISION OF LEFT BREAST LUMPECTOMY MULITPLE MARGINS;  Surgeon: Fanny Skates, MD;  Location: [place];  Service: General;  Laterality: Left;  . TUBAL LIGATION      I have reviewed the social history and family history with the patient and they are unchanged from previous note.  ALLERGIES:  is allergic to asa [aspirin]; nsaids; and penicillins.  MEDICATIONS:  Current Outpatient Medications  Medication Sig Dispense Refill  . acyclovir (ZOVIRAX) 400 MG tablet Take 400 mg by mouth 2 (two) times daily.    Marland Kitchen amLODipine (NORVASC) 5 MG tablet Take 5 mg by mouth daily.    . Ascorbic Acid (VITAMIN C) 1000 MG tablet Take 1,000 mg by mouth daily.    Marland Kitchen atorvastatin (LIPITOR) 20 MG tablet Take 20 mg by mouth daily.    . Cholecalciferol (VITAMIN D) 50 MCG (2000 UT) CAPS Take 2,000 Units by mouth daily.    . fluticasone (FLONASE) 50 MCG/ACT nasal spray Place 2 sprays into both nostrils daily.     . folic acid (FOLVITE) 423 MCG tablet Take 400 mcg by mouth daily.     Marland Kitchen lisinopril (ZESTRIL) 2.5 MG tablet Take 2.5 mg by mouth at bedtime.    Marland Kitchen loratadine (CLARITIN) 10 MG tablet Take 10 mg by mouth daily.    . metFORMIN (GLUMETZA) 500 MG (MOD) 24 hr tablet Take 1,500 mg by mouth daily with breakfast.     . montelukast (SINGULAIR) 10 MG tablet Take 10 mg by mouth at bedtime.    Marland Kitchen omeprazole (PRILOSEC) 20 MG capsule Take 20 mg by mouth daily.     Marland Kitchen PARoxetine (PAXIL) 10 MG tablet Take 10 mg by mouth at bedtime.    . potassium chloride (MICRO-K) 10 MEQ CR capsule Take 10 mEq by mouth 2 (two) times daily.    Marland Kitchen tiZANidine (ZANAFLEX) 4 MG capsule Take 4 mg by mouth every 8 (eight) hours.     . topiramate (TOPAMAX) 100 MG tablet Take 100 mg by mouth daily.    . vitamin B-12 (CYANOCOBALAMIN) 500 MCG tablet Take 500 mcg by mouth daily.     Marland Kitchen dexamethasone (DECADRON) 4 MG tablet Take 1 tablet (4 mg total) by mouth 2 (two) times daily. Start the day before Taxotere. Then 1 tab daily in the day after  chemo for 3 days. 30 tablet 0  . HYDROcodone-acetaminophen (NORCO) 5-325 MG tablet Take 1-2 tablets by mouth every 6 (six) hours as needed. (Patient not taking: Reported on 03/14/2019) 12 tablet 0  . lidocaine-prilocaine (EMLA) cream Apply to affected area once 30 g 3  . ondansetron (ZOFRAN) 8 MG tablet Take 1 tablet (8 mg total) by mouth 2 (two) times daily as needed for refractory nausea / vomiting. Start on day 3 after chemo. 30 tablet 1  . prochlorperazine (COMPAZINE) 10 MG tablet Take 1 tablet (10 mg total) by mouth every 6 (six) hours as needed (Nausea or vomiting). 30 tablet 1   No current facility-administered medications for this visit.     PHYSICAL EXAMINATION: ECOG PERFORMANCE STATUS: 1 - Symptomatic but completely ambulatory  Vitals:   03/14/19 0859  BP: 130/67  Pulse: 84  Resp: 18  Temp: 98.9 F (37.2 C)  SpO2: 96%   Filed Weights   03/14/19 0859  Weight: 262 lb 6.4 oz (119 kg)    GENERAL:alert, no distress and comfortable SKIN: skin color, texture, turgor are normal, no rashes or significant lesions EYES: normal, Conjunctiva are pink and non-injected, sclera clear  NECK: supple, thyroid normal size, non-tender, without nodularity LYMPH:  no palpable lymphadenopathy in the cervical, axillary  LUNGS: clear to auscultation and percussion with normal breathing effort HEART: regular rate & rhythm and no murmurs and no lower  extremity edema ABDOMEN:abdomen soft, non-tender and normal bowel sounds Musculoskeletal:no cyanosis of digits and no clubbing  NEURO: alert & oriented x 3 with fluent speech, no focal motor/sensory deficits BREAST: S/p left breast lumpectomy and re-excision: surgical incision healed well. No palpable mass, nodules or adenopathy bilaterally. Breast exam benign.  PAC clean and in place, no sign of infection.   LABORATORY DATA:  I have reviewed the data as listed CBC Latest Ref Rng & Units 02/21/2019 12/26/2018  WBC 4.0 - 10.5 K/uL 8.8 9.3  Hemoglobin 12.0 - 15.0 g/dL 13.5 12.7  Hematocrit 36.0 - 46.0 % 44.1 43.1  Platelets 150 - 400 K/uL 318 334     CMP Latest Ref Rng & Units 02/21/2019 12/26/2018  Glucose 70 - 99 mg/dL 122(H) 123(H)  BUN 6 - 20 mg/dL 9 10  Creatinine 0.44 - 1.00 mg/dL 1.24(H) 1.27(H)  Sodium 135 - 145 mmol/L 139 143  Potassium 3.5 - 5.1 mmol/L 4.0 3.7  Chloride 98 - 111 mmol/L 105 109  CO2 22 - 32 mmol/L 27 27  Calcium 8.9 - 10.3 mg/dL 9.3 9.3  Total Protein 6.5 - 8.1 g/dL - 7.1  Total Bilirubin 0.3 - 1.2 mg/dL - 0.8  Alkaline Phos 38 - 126 U/L - 90  AST 15 - 41 U/L - 11(L)  ALT 0 - 44 U/L - 9      RADIOGRAPHIC STUDIES: I have personally reviewed the radiological images as listed and agreed with the findings in the report. No results found.   ASSESSMENT & PLAN:  Emma Casey is a 58 y.o. 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  -She underwent left breast lumpectomy and SLN biopsy on 01/08/19. We discussed her pathology report which shows 1.8 cm IDC with DCIS, grade III, all 3 nodes negative. Her surgical margins were positive for DICS.  -Given positive margins she underwent left re-excision on 03/01/19. Pathology with negative margins. She is recovering well with left breast shooting pain. I discussed this  will improve over time but may not  completely resolve.  -Based on her RS 27, she will benefit from adjuvant chemo to reduce her risk of recurrence followed by RT and anti-estrogen therapy. We again discussed my recommendation of moderately intensive adjuvant chemo wiht docetaxel and cytoxan (TC) q3weeks for 4 cycles.    --Chemotherapy consent: Side effects including but does not limited to, fatigue, nausea, vomiting, diarrhea, hair loss (small risk of permanent hair loss), neuropathy, fluid retention, renal and kidney dysfunction, neutropenic fever, needed for blood transfusion, bleeding, were discussed with patient in great detail. She agrees to proceed.  -the goal of therapy is curative. We plan to start in 2 weeks.  -She had PAC placed with last surgery.  -She will proceed with chemo education class before starting treatment.  -F/u with start of chemo   2. HTN, DM  -On Amlodipine, lisinopril, metformin -HTN controlled -Will watch closely during chemo and pre-med dexa can effects her BG and BP.   3.CKD stage III -secondary to DM and HTN.  -I encouraged her to drink plenty of water and to avoid NSAID's -Will monitor   4. Smoking Cessation  -She smoked for 10 years before she quit. She restarted after recent cancer diagnosis  -I strongly encouraged her to quit smoking completely and continue cessation. She understands and agrees.  5. Genetic Testing was negative for pathogenetic mutations -Patient recently found out other than her maternal aunt, 3 of her maternal first cousins have breast cancer as well.     PLAN: -She is recovering from surgery well  -Chemo education class next week  -Lab, flush, f/u and TC in 1-2 weeks  -I called in dexa, and antiemetics    No problem-specific Assessment & Plan notes found for this encounter.   No orders of the defined types were placed in this encounter.  All questions were answered. The patient knows to call the clinic with any problems, questions or concerns. No  barriers to learning was detected. I spent 20 minutes counseling the patient face to face. The total time spent in the appointment was 25 minutes and more than 50% was on counseling and review of test results     Truitt Merle, MD 03/14/2019   I, Joslyn Devon, am acting as scribe for Truitt Merle, MD.   I have reviewed the above documentation for accuracy and completeness, and I agree with the above.

## 2019-03-14 ENCOUNTER — Telehealth: Payer: Self-pay | Admitting: Hematology

## 2019-03-14 ENCOUNTER — Inpatient Hospital Stay: Payer: Medicare Other | Attending: Hematology | Admitting: Hematology

## 2019-03-14 ENCOUNTER — Encounter: Payer: Self-pay | Admitting: Hematology

## 2019-03-14 ENCOUNTER — Other Ambulatory Visit: Payer: Self-pay

## 2019-03-14 VITALS — BP 130/67 | HR 84 | Temp 98.9°F | Resp 18 | Ht 66.0 in | Wt 262.4 lb

## 2019-03-14 DIAGNOSIS — Z79899 Other long term (current) drug therapy: Secondary | ICD-10-CM | POA: Insufficient documentation

## 2019-03-14 DIAGNOSIS — Z170 Estrogen receptor positive status [ER+]: Secondary | ICD-10-CM | POA: Insufficient documentation

## 2019-03-14 DIAGNOSIS — N183 Chronic kidney disease, stage 3 (moderate): Secondary | ICD-10-CM | POA: Insufficient documentation

## 2019-03-14 DIAGNOSIS — C50212 Malignant neoplasm of upper-inner quadrant of left female breast: Secondary | ICD-10-CM | POA: Diagnosis not present

## 2019-03-14 DIAGNOSIS — I129 Hypertensive chronic kidney disease with stage 1 through stage 4 chronic kidney disease, or unspecified chronic kidney disease: Secondary | ICD-10-CM | POA: Diagnosis not present

## 2019-03-14 DIAGNOSIS — Z5111 Encounter for antineoplastic chemotherapy: Secondary | ICD-10-CM | POA: Insufficient documentation

## 2019-03-14 DIAGNOSIS — Z803 Family history of malignant neoplasm of breast: Secondary | ICD-10-CM | POA: Diagnosis not present

## 2019-03-14 DIAGNOSIS — E1122 Type 2 diabetes mellitus with diabetic chronic kidney disease: Secondary | ICD-10-CM | POA: Diagnosis not present

## 2019-03-14 MED ORDER — ONDANSETRON HCL 8 MG PO TABS: 8.0000 mg | ORAL_TABLET | Freq: Two times a day (BID) | ORAL | 1 refills | Status: DC | PRN

## 2019-03-14 MED ORDER — LIDOCAINE-PRILOCAINE 2.5-2.5 % EX CREA: TOPICAL_CREAM | CUTANEOUS | 3 refills | Status: DC

## 2019-03-14 MED ORDER — DEXAMETHASONE 4 MG PO TABS: 4.0000 mg | ORAL_TABLET | Freq: Two times a day (BID) | ORAL | 0 refills | Status: DC

## 2019-03-14 MED ORDER — PROCHLORPERAZINE MALEATE 10 MG PO TABS: 10.0000 mg | ORAL_TABLET | Freq: Four times a day (QID) | ORAL | 1 refills | Status: DC | PRN

## 2019-03-14 NOTE — Telephone Encounter (Signed)
Scheduled appt per 7/23 los.  Spoke with patient and she is aware of her appt date and time.

## 2019-03-14 NOTE — Progress Notes (Signed)
START ON PATHWAY REGIMEN - Breast     A cycle is every 21 days:     Docetaxel      Cyclophosphamide   **Always confirm dose/schedule in your pharmacy ordering system**  Patient Characteristics: Postoperative without Neoadjuvant Therapy (Pathologic Staging), Invasive Disease, Adjuvant Therapy, HER2 Negative/Unknown/Equivocal, ER Positive, Node Negative, pT1a-c, pN0/N22m or pT2 or Higher, pN0, Oncotype High Risk (? 26) Therapeutic Status: Postoperative without Neoadjuvant Therapy (Pathologic Staging) AJCC Grade: G3 AJCC N Category: pN0 AJCC M Category: cM0 ER Status: Positive (+) AJCC 8 Stage Grouping: IA HER2 Status: Negative (-) Oncotype Dx Recurrence Score: 525AJCC T Category: pT1c PR Status: Positive (+) Has this patient completed genomic testing<= Yes - Oncotype DX(R) Intent of Therapy: Curative Intent, Discussed with Patient

## 2019-03-18 ENCOUNTER — Other Ambulatory Visit: Payer: Self-pay | Admitting: Hematology

## 2019-03-18 NOTE — Progress Notes (Signed)
[place]   [phone] [phone]   Clinic Follow up Note   Patient Care Team: Sandi Mealy, MD as PCP - General (Family Medicine) Mauro Kaufmann, RN as Oncology Nurse Navigator Rockwell Germany, RN as Oncology Nurse Navigator Fanny Skates, MD as Consulting Physician (General Surgery) Truitt Merle, MD as Consulting Physician (Hematology) Kyung Rudd, MD as Consulting Physician (Radiation Oncology)  Date of Service:  03/23/2019  CHIEF COMPLAINT: f/u of left breast cancer  SUMMARY OF ONCOLOGIC HISTORY: Oncology History Overview Note  Cancer Staging Malignant neoplasm of upper-inner quadrant of left breast in female, estrogen receptor positive ([place]) Staging form: Breast, AJCC 8th Edition - Clinical stage from 12/17/2018: Stage Unknown (cTX, cN0, cM0, G2, ER+, PR+, HER2-) - Signed by Truitt Merle, MD on 12/25/2018 - Pathologic stage from 01/08/2019: Stage IA (pT1c, pN0, cM0, G3, ER+, PR+, HER2-) - Signed by Truitt Merle, MD on 01/22/2019     Malignant neoplasm of upper-inner quadrant of left breast in female, estrogen receptor positive ([place])  11/28/2018 Mammogram   Diagnostic Mammogram 11/28/18 IMPRESSION:  Suspcious mass in upper inner quadrant of the left breast  at posterior depth.    12/17/2018 Initial Biopsy   Diagnosis 12/17/18 Breast, left, needle core biopsy, upper inner quadrant - INVASIVE DUCTAL CARCINOMA, SEE COMMENT. - DUCTAL CARCINOMA IN SITU.   12/17/2018 Cancer Staging   Staging form: Breast, AJCC 8th Edition - Clinical stage from 12/17/2018: Stage Unknown (cTX, cN0, cM0, G2, ER+, PR+, HER2-) - Signed by Truitt Merle, MD on 12/25/2018   12/20/2018 Initial Diagnosis   Malignant neoplasm of upper-inner quadrant of left breast in female, estrogen receptor positive ([place])   12/20/2018 Receptors her2   Results: IMMUNOHISTOCHEMICAL AND MORPHOMETRIC ANALYSIS PERFORMED MANUALLY The tumor cells are NEGATIVE for Her2 (1+). Estrogen Receptor: 95%,  POSITIVE, STRONG STAINING INTENSITY Progesterone Receptor: 30%, POSITIVE, STRONG STAINING INTENSITY Proliferation Marker Ki67: 20%   01/08/2019 Surgery   LEFT BREAST LUMPECTOMY WITH RADIOACTIVE SEED AND LEFT AXILLARY DEEP SENTINEL LYMPH NODE BIOPSY WITH BLUE DYE INJECTION by Dr [name] 01/08/19    01/08/2019 Pathology Results   Diagnosis 01/08/19 1. Breast, lumpectomy, left - INVASIVE DUCTAL CARCINOMA, GRADE III/III, SPANNING 1.8 CM. - DUCTAL CARCINOMA IN SITU, HIGH GRADE. - DUCTAL CARCINOMA IN SITU IS FOCALLY PRESENT AT THE ANTERIOR MARGIN OF SPECIMEN #1. - DUCTAL CARCINOMA IN SITU IS FOCALLY LESS THAN 0.1 CM FROM THE INFERIOR MARGIN OF SPECIMEN # 1. - SEE ONCOLOGY TABLE BELOW. 2. Breast, excision, left medial margin - DUCTAL CARCINOMA IN SITU, FOCALLY PRESENT AT THE MEDIAL MARGIN OF SPECIMEN # 2. 3. Breast, excision, left posterior margin - BENIGN BREAST PARENCHYMA. - THERE IS NO EVIDENCE OF MALIGNANCY. - SEE COMMENT. 4. Lymph node, sentinel, biopsy, left axillary - THERE IS NO EVIDENCE OF CARCINOMA IN 1 OF 1 LYMPH NODE (0/1). 5. Lymph node, sentinel, biopsy, left axillary - THERE IS NO EVIDENCE OF CARCINOMA IN 1 OF 1 LYMPH NODE (0/1). 6. Lymph node, sentinel, biopsy, left axillary - THERE IS NO EVIDENCE OF CARCINOMA IN 1 OF 1 LYMPH NODE (0/1)   01/08/2019 Oncotype testing   recurrence score of 27 with risk of recurrence 16% with Tamoxifen or AI alone. There is a benefit of chemotherapy >15%    01/08/2019 Cancer Staging   Staging form: Breast, AJCC 8th Edition - Pathologic stage from 01/08/2019: Stage IA (pT1c, pN0, cM0, G3, ER+, PR+, HER2-) - Signed by Truitt Merle, MD on 01/22/2019   01/29/2019 Genetic Testing  Negative genetic testing on the common hereditary cancer panel.  The Common Hereditary Gene Panel offered by [facility] includes sequencing and/or deletion duplication testing of the following 48 genes: APC, ATM, AXIN2, BARD1, BMPR1A, BRCA1, BRCA2, BRIP1, CDH1, CDK4, CDKN2A (p14ARF),  CDKN2A (p16INK4a), CHEK2, CTNNA1, DICER1, EPCAM (Deletion/duplication testing only), GREM1 (promoter region deletion/duplication testing only), KIT, MEN1, MLH1, MSH2, MSH3, MSH6, MUTYH, NBN, NF1, NHTL1, PALB2, PDGFRA, PMS2, POLD1, POLE, PTEN, RAD50, RAD51C, RAD51D, RNF43, SDHB, SDHC, SDHD, SMAD4, SMARCA4. STK11, TP53, TSC1, TSC2, and VHL.  The following genes were evaluated for sequence changes only: SDHA and HOXB13 c.251G>A variant only. The report date is January 29, 2019.   03/01/2019 Surgery   RE-EXCISION OF LEFT BREAST LUMPECTOMY MULITPLE MARGINS by Dr. [name]  Diagnosis 1. Breast, excision, re-excision left anterior margin - BENIGN BREAST PARENCHYMA WITH PREVIOUS PROCEDURE-RELATED CHANGES - NEGATIVE FOR IN SITU OR INVASIVE CARCINOMA 2. 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 SEE NOTE - DCIS IS FOCALLY LESS THAN 1 MM FROM NEW INFERIOR MARGIN - NEGATIVE FOR INVASIVE CARCINOMA - PREVIOUS PROCEDURE-RELATED CHANGES 3. Breast, excision, re-excision left medial margin - FOCAL DUCTAL CARCINOMA IN SITU (DCIS), 0.2 CM. SEE NOTE - DCIS IS 1.5 MM FROM NEW MEDIAL MARGIN - NEGATIVE FOR INVASIVE CARCINOMA - PREVIOUS PROCEDURE-RELATED CHANGES 4. Breast, excision, re-excision left final medial argin - BENIGN BREAST PARENCHYMA WITH MILD FOCAL PREVIOUS PROCEDURE-RELATED CHANGES - NEGATIVE FOR IN SITU OR INVASIVE CARCINOMA    03/22/2019 -  Chemotherapy   TC q3weeks for 4 cycles starting 03/22/19       CURRENT THERAPY:  Adjuvant chemo TCq3weeks for 4 cycles starting 03/22/19  INTERVAL HISTORY:  Emma Casey is here for a follow up and treatment. She presents to the clinic alone.  She notes the muscle relaxer did not work for her last night. She did not sleep at all last night. She was nervous with start of treatment. She took her AM and PM dexa dose yesterday. She feels it helped with her allergies. She notes her breast surgery incision healed  well for her. She notes she will see Dr. [name] in 3 days for follow up.  She feels she has recovered well from surgery with adequate appetite and eating.     REVIEW OF SYSTEMS:   Constitutional: Denies fevers, chills or abnormal weight loss Eyes: Denies blurriness of vision Ears, nose, mouth, throat, and face: Denies mucositis or sore throat Respiratory: Denies cough, dyspnea or wheezes Cardiovascular: Denies palpitation, chest discomfort or lower extremity swelling Gastrointestinal:  Denies nausea, heartburn or change in bowel habits Skin: Denies abnormal skin rashes Lymphatics: Denies new lymphadenopathy or easy bruising Neurological:Denies numbness, tingling or new weaknesses Behavioral/Psych: Mood is stable, no new changes  All other systems were reviewed with the patient and are negative.  MEDICAL HISTORY:  Past Medical History:  Diagnosis Date  . Anxiety   . Arthritis    right hand  . Bronchitis   . Cancer ([place]) 11/2018   left breast ca  . Depression   . Diabetes mellitus without complication ([place])   . Family history of breast cancer   . GERD (gastroesophageal reflux disease)   . Headache   . Heart murmur   . Herpes 1&2   . Hypertension   . Leg fracture, left    as a child  . OSA on CPAP    has CPAP but has not used in over a month    SURGICAL HISTORY: Past Surgical  History:  Procedure Laterality Date  . ABDOMINAL HYSTERECTOMY    . BREAST LUMPECTOMY WITH RADIOACTIVE SEED AND SENTINEL LYMPH NODE BIOPSY Left 01/08/2019   Procedure: LEFT BREAST LUMPECTOMY WITH RADIOACTIVE SEED AND LEFT AXILLARY DEEP SENTINEL LYMPH NODE BIOPSY WITH BLUE DYE INJECTION;  Surgeon: Fanny Skates, MD;  Location: [facility];  Service: General;  Laterality: Left;  . COLONOSCOPY N/A 09/02/2015   Procedure: COLONOSCOPY;  Surgeon: Danie Binder, MD;  [facility];  Service: Endoscopy;  Laterality: N/A;  11:15 Am  . EYE SURGERY Right    cataract  . HAND SURGERY  Right    broken bone  . PORTACATH PLACEMENT N/A 03/01/2019   Procedure: INSERTION PORT-A-CATH;  Surgeon: Fanny Skates, MD;  Location: [place];  Service: General;  Laterality: N/A;  . RE-EXCISION OF BREAST LUMPECTOMY Left 03/01/2019   Procedure: RE-EXCISION OF LEFT BREAST LUMPECTOMY MULITPLE MARGINS;  Surgeon: Fanny Skates, MD;  Location: [facility];  Service: General;  Laterality: Left;  . TUBAL LIGATION      I have reviewed the social history and family history with the patient and they are unchanged from previous note.  ALLERGIES:  is allergic to asa [aspirin]; nsaids; and penicillins.  MEDICATIONS:  Current Outpatient Medications  Medication Sig Dispense Refill  . acyclovir (ZOVIRAX) 400 MG tablet Take 400 mg by mouth 2 (two) times daily.    Marland Kitchen amLODipine (NORVASC) 5 MG tablet Take 5 mg by mouth daily.    . Ascorbic Acid (VITAMIN C) 1000 MG tablet Take 1,000 mg by mouth daily.    Marland Kitchen atorvastatin (LIPITOR) 20 MG tablet Take 20 mg by mouth daily.    . Cholecalciferol (VITAMIN D) 50 MCG (2000 UT) CAPS Take 2,000 Units by mouth daily.    Marland Kitchen dexamethasone (DECADRON) 4 MG tablet Take 1 tablet (4 mg total) by mouth 2 (two) times daily. Start the day before Taxotere. Then 1 tab daily in the day after chemo for 3 days. 30 tablet 0  . fluticasone (FLONASE) 50 MCG/ACT nasal spray Place 2 sprays into both nostrils daily.     . folic acid (FOLVITE) 045 MCG tablet Take 400 mcg by mouth daily.     Marland Kitchen HYDROcodone-acetaminophen (NORCO) 5-325 MG tablet Take 1-2 tablets by mouth every 6 (six) hours as needed. 12 tablet 0  . lidocaine-prilocaine (EMLA) cream Apply to affected area once 30 g 3  . lisinopril (ZESTRIL) 2.5 MG tablet Take 2.5 mg by mouth at bedtime.    Marland Kitchen loratadine (CLARITIN) 10 MG tablet Take 10 mg by mouth daily.    . metFORMIN (GLUMETZA) 500 MG (MOD) 24 hr tablet Take 1,500 mg by mouth daily with breakfast.     . montelukast (SINGULAIR) 10 MG tablet Take 10 mg by mouth at bedtime.    Marland Kitchen  omeprazole (PRILOSEC) 20 MG capsule Take 20 mg by mouth daily.    . ondansetron (ZOFRAN) 8 MG tablet Take 1 tablet (8 mg total) by mouth 2 (two) times daily as needed for refractory nausea / vomiting. Start on day 3 after chemo. 30 tablet 1  . PARoxetine (PAXIL) 10 MG tablet Take 10 mg by mouth at bedtime.    . potassium chloride (MICRO-K) 10 MEQ CR capsule Take 10 mEq by mouth 2 (two) times daily.    . prochlorperazine (COMPAZINE) 10 MG tablet Take 1 tablet (10 mg total) by mouth every 6 (six) hours as needed (Nausea or vomiting). 30 tablet 1  . tiZANidine (ZANAFLEX) 4 MG  capsule Take 4 mg by mouth every 8 (eight) hours.     . topiramate (TOPAMAX) 100 MG tablet Take 100 mg by mouth daily.    . vitamin B-12 (CYANOCOBALAMIN) 500 MCG tablet Take 500 mcg by mouth daily.      No current facility-administered medications for this visit.     PHYSICAL EXAMINATION: ECOG PERFORMANCE STATUS: 1 - Symptomatic but completely ambulatory  Vitals:   03/22/19 0903  BP: (!) 123/95  Pulse: 79  Resp: 18  Temp: 98.3 F (36.8 C)  SpO2: 100%   Filed Weights   03/22/19 0903  Weight: 264 lb 14.4 oz (120.2 kg)    GENERAL:alert, no distress and comfortable SKIN: skin color, texture, turgor are normal, no rashes or significant lesions EYES: normal, Conjunctiva are pink and non-injected, sclera clear  NECK: supple, thyroid normal size, non-tender, without nodularity LYMPH:  no palpable lymphadenopathy in the cervical, axillary  LUNGS: clear to auscultation and percussion with normal breathing effort HEART: regular rate & rhythm and no murmurs and no lower extremity edema ABDOMEN:abdomen soft, non-tender and normal bowel sounds Musculoskeletal:no cyanosis of digits and no clubbing  NEURO: alert & oriented x 3 with fluent speech, no focal motor/sensory deficits  LABORATORY DATA:  I have reviewed the data as listed CBC Latest Ref Rng & Units 03/22/2019 02/21/2019 12/26/2018  WBC 4.0 - 10.5 K/uL 13.4(H) 8.8 9.3   Hemoglobin 12.0 - 15.0 g/dL 12.0 13.5 12.7  Hematocrit 36.0 - 46.0 % 39.4 44.1 43.1  Platelets 150 - 400 K/uL 310 318 334     CMP Latest Ref Rng & Units 03/22/2019 02/21/2019 12/26/2018  Glucose 70 - 99 mg/dL 371(H) 122(H) 123(H)  BUN 6 - 20 mg/dL _0 Creatinine 0.44 - 1.00 mg/dL 1.56(H) 1.24(H) 1.27(H)  Sodium 135 - 145 mmol/L 136 139 143  Potassium 3.5 - 5.1 mmol/L 4.0 4.0 3.7  Chloride 98 - 111 mmol/L 102 105 109  CO2 22 - 32 mmol/L _1 Calcium 8.9 - 10.3 mg/dL 9.9 9.3 9.3  Total Protein 6.5 - 8.1 g/dL 6.7 - 7.1  Total Bilirubin 0.3 - 1.2 mg/dL 0.4 - 0.8  Alkaline Phos 38 - 126 U/L 79 - 90  AST 15 - 41 U/L 7(L) - 11(L)  ALT 0 - 44 U/L 8 - 9      RADIOGRAPHIC STUDIES: I have personally reviewed the radiological images as listed and agreed with the findings in the report. No results found.   ASSESSMENT & PLAN:  Zanyah Lentsch is a 58 y.o. female with   1.Malignant neoplasm of upper-inner quadrant of left breast,pT1cN0,stage I,ER/PR+,HER2-, GradeIII, Oncotype RS 27 -She was diagnosed in 11/2018.  -She underwentleftbreastlumpectomyand SLN biopsyon 01/08/19. We discussedher pathology report which shows 1.8 cmIDC with DCIS,grade III,all 3nodesnegative. Her surgical margins were positive for DCIS.  -Given positive margins she underwent left re-excision on 03/01/19. Pathology with negative margins. She is recovering well with left breast shooting pain. I discussed this will improve over time but may not completely resolve.  -Based on her RS 27, she will benefit from adjuvant chemo to reduce her risk of recurrence followed by RT and anti-estrogen therapy. -Plan to start her on moderately intensive adjuvant chemo with docetaxel and cytoxan (TC)q3weeks for 4 cycles on 03/22/19  -Labs reviewed, CBC and CMP WNL except WBC 13.4, ANC 11.8, Cr 1.56 and BG 371. Overall adequate to start TC today. She will have Udenyca injections on day 4. I again reviewed expectation of  some fatigue and diarrhea. She will contact clinic if she developed significant or unexpected side effects or fever.  -Given sleep issues she will take her dexa in the AM for the next 3 days.  -F/u next week for toxicity check with NP Emma Casey. F/u with me in 3 weeks with cycle 2.    2. HTN, DM with hyperglycemia  -On Amlodipine, lisinopril, metformin -HTN controlled -Will watch closely during chemo and pre-med dexa can effects her BG and BP. I also encouraged her to monitor at home.  -I instructed her to reduce her sugar intake in the next week. I have already reduced her dexa pre-med dose.  -Her BG 371 today, likely related to will give her regular insulin 4 units before lunch today.  I recommend her to control and hopefully any sweets in the next 3 to 4 days  3.CKD stage III -secondary to DM and HTN.  -I encouraged her to drink plenty of water and to avoid NSAID's -Will monitor   4. Smoking Cessation  -She smoked for 10 years before she quit. She restarted after recent cancer diagnosis  -I strongly encouraged her to quit smoking completely and continue cessation. She understands and agrees.  5. Genetic Testing was negative for pathogenetic mutations -Patient recently found out other than her maternal aunt, 3 of her maternal first cousins have breast cancer as well.    PLAN: -Labs reviewed and adequate to proceed with first cycle TC today, with Udenyca next Monday, will give 4u regular insulin before lunch for hyperglycemia -Lab, f/u with NP Emma Casey and IV Fluids next week -Lab, f/u and TC in 3, 6, 9 weeks    No problem-specific Assessment & Plan notes found for this encounter.   No orders of the defined types were placed in this encounter.  All questions were answered. The patient knows to call the clinic with any problems, questions or concerns. No barriers to learning was detected. I spent 20 minutes counseling the patient face to face. The total time spent in the  appointment was 25 minutes and more than 50% was on counseling and review of test results     Truitt Merle, MD 03/23/2019   I, Joslyn Devon, am acting as scribe for Truitt Merle, MD.   I have reviewed the above documentation for accuracy and completeness, and I agree with the above.

## 2019-03-19 ENCOUNTER — Inpatient Hospital Stay: Payer: Medicare Other

## 2019-03-19 ENCOUNTER — Telehealth: Payer: Self-pay | Admitting: *Deleted

## 2019-03-21 ENCOUNTER — Other Ambulatory Visit: Payer: Self-pay

## 2019-03-21 DIAGNOSIS — C50212 Malignant neoplasm of upper-inner quadrant of left female breast: Secondary | ICD-10-CM

## 2019-03-22 ENCOUNTER — Inpatient Hospital Stay: Payer: Medicare Other

## 2019-03-22 ENCOUNTER — Telehealth: Payer: Self-pay | Admitting: Hematology

## 2019-03-22 ENCOUNTER — Encounter: Payer: Self-pay | Admitting: *Deleted

## 2019-03-22 ENCOUNTER — Other Ambulatory Visit: Payer: Self-pay

## 2019-03-22 ENCOUNTER — Inpatient Hospital Stay (HOSPITAL_BASED_OUTPATIENT_CLINIC_OR_DEPARTMENT_OTHER): Payer: Medicare Other | Admitting: Hematology

## 2019-03-22 VITALS — BP 119/92 | HR 79 | Temp 98.1°F | Resp 16

## 2019-03-22 VITALS — BP 123/95 | HR 79 | Temp 98.3°F | Resp 18 | Ht 66.0 in | Wt 264.9 lb

## 2019-03-22 DIAGNOSIS — Z79899 Other long term (current) drug therapy: Secondary | ICD-10-CM

## 2019-03-22 DIAGNOSIS — N183 Chronic kidney disease, stage 3 (moderate): Secondary | ICD-10-CM

## 2019-03-22 DIAGNOSIS — Z5111 Encounter for antineoplastic chemotherapy: Secondary | ICD-10-CM | POA: Diagnosis not present

## 2019-03-22 DIAGNOSIS — C50212 Malignant neoplasm of upper-inner quadrant of left female breast: Secondary | ICD-10-CM | POA: Diagnosis not present

## 2019-03-22 DIAGNOSIS — Z170 Estrogen receptor positive status [ER+]: Secondary | ICD-10-CM

## 2019-03-22 DIAGNOSIS — I129 Hypertensive chronic kidney disease with stage 1 through stage 4 chronic kidney disease, or unspecified chronic kidney disease: Secondary | ICD-10-CM | POA: Diagnosis not present

## 2019-03-22 DIAGNOSIS — Z803 Family history of malignant neoplasm of breast: Secondary | ICD-10-CM

## 2019-03-22 DIAGNOSIS — R739 Hyperglycemia, unspecified: Secondary | ICD-10-CM

## 2019-03-22 DIAGNOSIS — Z95828 Presence of other vascular implants and grafts: Secondary | ICD-10-CM

## 2019-03-22 DIAGNOSIS — T451X5A Adverse effect of antineoplastic and immunosuppressive drugs, initial encounter: Secondary | ICD-10-CM | POA: Insufficient documentation

## 2019-03-22 LAB — CBC WITH DIFFERENTIAL (CANCER CENTER ONLY)
Abs Immature Granulocytes: 0.09 10*3/uL — ABNORMAL HIGH (ref 0.00–0.07)
Basophils Absolute: 0 10*3/uL (ref 0.0–0.1)
Basophils Relative: 0 %
Eosinophils Absolute: 0 10*3/uL (ref 0.0–0.5)
Eosinophils Relative: 0 %
HCT: 39.4 % (ref 36.0–46.0)
Hemoglobin: 12 g/dL (ref 12.0–15.0)
Immature Granulocytes: 1 %
Lymphocytes Relative: 9 %
Lymphs Abs: 1.2 10*3/uL (ref 0.7–4.0)
MCH: 28.3 pg (ref 26.0–34.0)
MCHC: 30.5 g/dL (ref 30.0–36.0)
MCV: 92.9 fL (ref 80.0–100.0)
Monocytes Absolute: 0.4 10*3/uL (ref 0.1–1.0)
Monocytes Relative: 3 %
Neutro Abs: 11.8 10*3/uL — ABNORMAL HIGH (ref 1.7–7.7)
Neutrophils Relative %: 87 %
Platelet Count: 310 10*3/uL (ref 150–400)
RBC: 4.24 MIL/uL (ref 3.87–5.11)
RDW: 14 % (ref 11.5–15.5)
WBC Count: 13.4 10*3/uL — ABNORMAL HIGH (ref 4.0–10.5)
nRBC: 0 % (ref 0.0–0.2)

## 2019-03-22 LAB — CMP (CANCER CENTER ONLY)
ALT: 8 U/L (ref 0–44)
AST: 7 U/L — ABNORMAL LOW (ref 15–41)
Albumin: 3.7 g/dL (ref 3.5–5.0)
Alkaline Phosphatase: 79 U/L (ref 38–126)
Anion gap: 9 (ref 5–15)
BUN: 16 mg/dL (ref 6–20)
CO2: 25 mmol/L (ref 22–32)
Calcium: 9.9 mg/dL (ref 8.9–10.3)
Chloride: 102 mmol/L (ref 98–111)
Creatinine: 1.56 mg/dL — ABNORMAL HIGH (ref 0.44–1.00)
GFR, Est AFR Am: 42 mL/min — ABNORMAL LOW (ref >60)
GFR, Estimated: 36 mL/min — ABNORMAL LOW (ref >60)
Glucose, Bld: 371 mg/dL — ABNORMAL HIGH (ref 70–99)
Potassium: 4 mmol/L (ref 3.5–5.1)
Sodium: 136 mmol/L (ref 135–145)
Total Bilirubin: 0.4 mg/dL (ref 0.3–1.2)
Total Protein: 6.7 g/dL (ref 6.5–8.1)

## 2019-03-22 MED ORDER — SODIUM CHLORIDE 0.9% FLUSH
10.0000 mL | INTRAVENOUS | Status: DC | PRN
  Administered 2019-03-22: 10 mL
  Filled 2019-03-22: qty 10

## 2019-03-22 MED ORDER — INSULIN REGULAR HUMAN 100 UNIT/ML IJ SOLN
4.0000 [IU] | Freq: Once | INTRAMUSCULAR | Status: DC
  Filled 2019-03-22: qty 10

## 2019-03-22 MED ORDER — SODIUM CHLORIDE 0.9% FLUSH
10.0000 mL | Freq: Once | INTRAVENOUS | Status: AC
  Administered 2019-03-22: 10 mL
  Filled 2019-03-22: qty 10

## 2019-03-22 MED ORDER — PALONOSETRON HCL INJECTION 0.25 MG/5ML
0.2500 mg | Freq: Once | INTRAVENOUS | Status: AC
  Administered 2019-03-22: 0.25 mg via INTRAVENOUS

## 2019-03-22 MED ORDER — SODIUM CHLORIDE 0.9 % IV SOLN
600.0000 mg/m2 | Freq: Once | INTRAVENOUS | Status: AC
  Administered 2019-03-22: 1420 mg via INTRAVENOUS
  Filled 2019-03-22: qty 71

## 2019-03-22 MED ORDER — HEPARIN SOD (PORK) LOCK FLUSH 100 UNIT/ML IV SOLN
500.0000 [IU] | Freq: Once | INTRAVENOUS | Status: AC | PRN
  Administered 2019-03-22: 14:00:00 500 [IU]
  Filled 2019-03-22: qty 5

## 2019-03-22 MED ORDER — SODIUM CHLORIDE 0.9 % IV SOLN
75.0000 mg/m2 | Freq: Once | INTRAVENOUS | Status: AC
  Administered 2019-03-22: 180 mg via INTRAVENOUS
  Filled 2019-03-22: qty 18

## 2019-03-22 MED ORDER — SODIUM CHLORIDE 0.9 % IV SOLN
Freq: Once | INTRAVENOUS | Status: AC
  Administered 2019-03-22: 10:00:00 via INTRAVENOUS
  Filled 2019-03-22: qty 250

## 2019-03-22 MED ORDER — DEXAMETHASONE SODIUM PHOSPHATE 10 MG/ML IJ SOLN
10.0000 mg | Freq: Once | INTRAMUSCULAR | Status: AC
  Administered 2019-03-22: 10 mg via INTRAVENOUS

## 2019-03-22 MED ORDER — PALONOSETRON HCL INJECTION 0.25 MG/5ML
INTRAVENOUS | Status: AC
  Filled 2019-03-22: qty 5

## 2019-03-22 MED ORDER — INSULIN REGULAR HUMAN 100 UNIT/ML IJ SOLN
4.0000 [IU] | Freq: Once | INTRAMUSCULAR | Status: AC
  Administered 2019-03-22: 4 [IU] via SUBCUTANEOUS
  Filled 2019-03-22: qty 10

## 2019-03-22 MED ORDER — DEXAMETHASONE SODIUM PHOSPHATE 10 MG/ML IJ SOLN
INTRAMUSCULAR | Status: AC
  Filled 2019-03-22: qty 1

## 2019-03-22 NOTE — Telephone Encounter (Signed)
Scheduled appt per 7/31 LOS - pt to get an updated schedule in treatment area. RN Chelsea aware to give patient new print out.

## 2019-03-22 NOTE — Progress Notes (Signed)
Okay to tx with elevated creatinine today. Okay to use PAC per Dr. Burr Medico.

## 2019-03-22 NOTE — Patient Instructions (Signed)
[place] Discharge Instructions for Patients Receiving Chemotherapy  Today you received the following chemotherapy agents: Taxotere, cytoxan.  To help prevent nausea and vomiting after your treatment, we encourage you to take your nausea medication as prescribed.   If you develop nausea and vomiting that is not controlled by your nausea medication, call the clinic.   BELOW ARE SYMPTOMS THAT SHOULD BE REPORTED IMMEDIATELY:  *FEVER GREATER THAN 100.5 F  *CHILLS WITH OR WITHOUT FEVER  NAUSEA AND VOMITING THAT IS NOT CONTROLLED WITH YOUR NAUSEA MEDICATION  *UNUSUAL SHORTNESS OF BREATH  *UNUSUAL BRUISING OR BLEEDING  TENDERNESS IN MOUTH AND THROAT WITH OR WITHOUT PRESENCE OF ULCERS  *URINARY PROBLEMS  *BOWEL PROBLEMS  UNUSUAL RASH Items with * indicate a potential emergency and should be followed up as soon as possible.  Feel free to call the clinic should you have any questions or concerns. The clinic phone number is [phone].  Please show the [place] at check-in to the Emergency Department and triage nurse.  Docetaxel injection (Taxotere) What is this medicine? DOCETAXEL (doe se TAX el) is a chemotherapy drug. It targets fast dividing cells, like cancer cells, and causes these cells to die. This medicine is used to treat many types of cancers like breast cancer, certain stomach cancers, head and neck cancer, lung cancer, and prostate cancer. This medicine may be used for other purposes; ask your health care provider or pharmacist if you have questions. COMMON BRAND NAME(S): Docefrez, Taxotere What should I tell my health care provider before I take this medicine? They need to know if you have any of these conditions:  infection (especially a virus infection such as chickenpox, cold sores, or herpes)  liver disease  low blood counts, like low white cell, platelet, or red cell counts  an unusual or allergic reaction to docetaxel, polysorbate  80, other chemotherapy agents, other medicines, foods, dyes, or preservatives  pregnant or trying to get pregnant  breast-feeding How should I use this medicine? This drug is given as an infusion into a vein. It is administered in a hospital or clinic by a specially trained health care professional. Talk to your pediatrician regarding the use of this medicine in children. Special care may be needed. Overdosage: If you think you have taken too much of this medicine contact a poison control center or emergency room at once. NOTE: This medicine is only for you. Do not share this medicine with others. What if I miss a dose? It is important not to miss your dose. Call your doctor or health care professional if you are unable to keep an appointment. What may interact with this medicine?  aprepitant  certain antibiotics like erythromycin or clarithromycin  certain antivirals for HIV or hepatitis  certain medicines for fungal infections like fluconazole, itraconazole, ketoconazole, posaconazole, or voriconazole  cimetidine  ciprofloxacin  conivaptan  cyclosporine  dronedarone  fluvoxamine  grapefruit juice  imatinib  verapamil This list may not describe all possible interactions. Give your health care provider a list of all the medicines, herbs, non-prescription drugs, or dietary supplements you use. Also tell them if you smoke, drink alcohol, or use illegal drugs. Some items may interact with your medicine. What should I watch for while using this medicine? Your condition will be monitored carefully while you are receiving this medicine. You will need important blood work done while you are taking this medicine. Call your doctor or health care professional for advice if you get a fever,  chills or sore throat, or other symptoms of a cold or flu. Do not treat yourself. This drug decreases your body's ability to fight infections. Try to avoid being around people who are sick. Some  products may contain alcohol. Ask your health care professional if this medicine contains alcohol. Be sure to tell all health care professionals you are taking this medicine. Certain medicines, like metronidazole and disulfiram, can cause an unpleasant reaction when taken with alcohol. The reaction includes flushing, headache, nausea, vomiting, sweating, and increased thirst. The reaction can last from 30 minutes to several hours. You may get drowsy or dizzy. Do not drive, use machinery, or do anything that needs mental alertness until you know how this medicine affects you. Do not stand or sit up quickly, especially if you are an older patient. This reduces the risk of dizzy or fainting spells. Alcohol may interfere with the effect of this medicine. Talk to your health care professional about your risk of cancer. You may be more at risk for certain types of cancer if you take this medicine. Do not become pregnant while taking this medicine or for 6 months after stopping it. Women should inform their doctor if they wish to become pregnant or think they might be pregnant. There is a potential for serious side effects to an unborn child. Talk to your health care professional or pharmacist for more information. Do not breast-feed an infant while taking this medicine or for 1 to 2 weeks after stopping it. Males who get this medicine must use a condom during sex with females who can get pregnant. If you get a woman pregnant, the baby could have birth defects. The baby could die before they are born. You will need to continue wearing a condom for 3 months after stopping the medicine. Tell your health care provider right away if your partner becomes pregnant while you are taking this medicine. This may interfere with the ability to father a child. You should talk to your doctor or health care professional if you are concerned about your fertility. What side effects may I notice from receiving this medicine? Side  effects that you should report to your doctor or health care professional as soon as possible:  allergic reactions like skin rash, itching or hives, swelling of the face, lips, or tongue  blurred vision  breathing problems  changes in vision  low blood counts - This drug may decrease the number of white blood cells, red blood cells and platelets. You may be at increased risk for infections and bleeding.  nausea and vomiting  pain, redness or irritation at site where injected  pain, tingling, numbness in the hands or feet  redness, blistering, peeling, or loosening of the skin, including inside the mouth  signs of decreased platelets or bleeding - bruising, pinpoint red spots on the skin, black, tarry stools, nosebleeds  signs of decreased red blood cells - unusually weak or tired, fainting spells, lightheadedness  signs of infection - fever or chills, cough, sore throat, pain or difficulty passing urine  swelling of the ankle, feet, hands Side effects that usually do not require medical attention (report to your doctor or health care professional if they continue or are bothersome):  constipation  diarrhea  fingernail or toenail changes  hair loss  loss of appetite  mouth sores  muscle pain This list may not describe all possible side effects. Call your doctor for medical advice about side effects. You may report side effects to FDA  at 1-800-FDA-1088. Where should I keep my medicine? This drug is given in a hospital or clinic and will not be stored at home. NOTE: This sheet is a summary. It may not cover all possible information. If you have questions about this medicine, talk to your doctor, pharmacist, or health care provider.  2020 [employer]/Gold Standard (2018-10-12 12:23:11)  Cyclophosphamide injection (Cytoxan) What is this medicine? CYCLOPHOSPHAMIDE (sye kloe FOSS fa mide) is a chemotherapy drug. It slows the growth of cancer cells. This medicine is used to  treat many types of cancer like lymphoma, myeloma, leukemia, breast cancer, and ovarian cancer, to name a few. This medicine may be used for other purposes; ask your health care provider or pharmacist if you have questions. COMMON BRAND NAME(S): Cytoxan, Neosar What should I tell my health care provider before I take this medicine? They need to know if you have any of these conditions:  blood disorders  history of other chemotherapy  infection  kidney disease  liver disease  recent or ongoing radiation therapy  tumors in the bone marrow  an unusual or allergic reaction to cyclophosphamide, other chemotherapy, other medicines, foods, dyes, or preservatives  pregnant or trying to get pregnant  breast-feeding How should I use this medicine? This drug is usually given as an injection into a vein or muscle or by infusion into a vein. It is administered in a hospital or clinic by a specially trained health care professional. Talk to your pediatrician regarding the use of this medicine in children. Special care may be needed. Overdosage: If you think you have taken too much of this medicine contact a poison control center or emergency room at once. NOTE: This medicine is only for you. Do not share this medicine with others. What if I miss a dose? It is important not to miss your dose. Call your doctor or health care professional if you are unable to keep an appointment. What may interact with this medicine? This medicine may interact with the following medications:  amiodarone  amphotericin B  azathioprine  certain antiviral medicines for HIV or AIDS such as protease inhibitors (e.g., indinavir, ritonavir) and zidovudine  certain blood pressure medications such as benazepril, captopril, enalapril, fosinopril, lisinopril, moexipril, monopril, perindopril, quinapril, ramipril, trandolapril  certain cancer medications such as anthracyclines (e.g., daunorubicin, doxorubicin),  busulfan, cytarabine, paclitaxel, pentostatin, tamoxifen, trastuzumab  certain diuretics such as chlorothiazide, chlorthalidone, hydrochlorothiazide, indapamide, metolazone  certain medicines that treat or prevent blood clots like warfarin  certain muscle relaxants such as succinylcholine  cyclosporine  etanercept  indomethacin  medicines to increase blood counts like filgrastim, pegfilgrastim, sargramostim  medicines used as general anesthesia  metronidazole  natalizumab This list may not describe all possible interactions. Give your health care provider a list of all the medicines, herbs, non-prescription drugs, or dietary supplements you use. Also tell them if you smoke, drink alcohol, or use illegal drugs. Some items may interact with your medicine. What should I watch for while using this medicine? Visit your doctor for checks on your progress. This drug may make you feel generally unwell. This is not uncommon, as chemotherapy can affect healthy cells as well as cancer cells. Report any side effects. Continue your course of treatment even though you feel ill unless your doctor tells you to stop. Drink water or other fluids as directed. Urinate often, even at night. In some cases, you may be given additional medicines to help with side effects. Follow all directions for their use. Call your doctor  or health care professional for advice if you get a fever, chills or sore throat, or other symptoms of a cold or flu. Do not treat yourself. This drug decreases your body's ability to fight infections. Try to avoid being around people who are sick. This medicine may increase your risk to bruise or bleed. Call your doctor or health care professional if you notice any unusual bleeding. Be careful brushing and flossing your teeth or using a toothpick because you may get an infection or bleed more easily. If you have any dental work done, tell your dentist you are receiving this medicine. You  may get drowsy or dizzy. Do not drive, use machinery, or do anything that needs mental alertness until you know how this medicine affects you. Do not become pregnant while taking this medicine or for 1 year after stopping it. Women should inform their doctor if they wish to become pregnant or think they might be pregnant. Men should not father a child while taking this medicine and for 4 months after stopping it. There is a potential for serious side effects to an unborn child. Talk to your health care professional or pharmacist for more information. Do not breast-feed an infant while taking this medicine. This medicine may interfere with the ability to have a child. This medicine has caused ovarian failure in some women. This medicine has caused reduced sperm counts in some men. You should talk with your doctor or health care professional if you are concerned about your fertility. If you are going to have surgery, tell your doctor or health care professional that you have taken this medicine. What side effects may I notice from receiving this medicine? Side effects that you should report to your doctor or health care professional as soon as possible:  allergic reactions like skin rash, itching or hives, swelling of the face, lips, or tongue  low blood counts - this medicine may decrease the number of white blood cells, red blood cells and platelets. You may be at increased risk for infections and bleeding.  signs of infection - fever or chills, cough, sore throat, pain or difficulty passing urine  signs of decreased platelets or bleeding - bruising, pinpoint red spots on the skin, black, tarry stools, blood in the urine  signs of decreased red blood cells - unusually weak or tired, fainting spells, lightheadedness  breathing problems  dark urine  dizziness  palpitations  swelling of the ankles, feet, hands  trouble passing urine or change in the amount of urine  weight gain  yellowing  of the eyes or skin Side effects that usually do not require medical attention (report to your doctor or health care professional if they continue or are bothersome):  changes in nail or skin color  hair loss  missed menstrual periods  mouth sores  nausea, vomiting This list may not describe all possible side effects. Call your doctor for medical advice about side effects. You may report side effects to FDA at 1-800-FDA-1088. Where should I keep my medicine? This drug is given in a hospital or clinic and will not be stored at home. NOTE: This sheet is a summary. It may not cover all possible information. If you have questions about this medicine, talk to your doctor, pharmacist, or health care provider.  2020 [employer]/Gold Standard (2012-06-22 16:22:58)

## 2019-03-23 ENCOUNTER — Encounter: Payer: Self-pay | Admitting: Hematology

## 2019-03-25 ENCOUNTER — Inpatient Hospital Stay: Payer: Medicare Other | Attending: Hematology

## 2019-03-25 ENCOUNTER — Other Ambulatory Visit: Payer: Self-pay

## 2019-03-25 VITALS — BP 138/71 | HR 72 | Temp 97.7°F | Resp 18

## 2019-03-25 DIAGNOSIS — F419 Anxiety disorder, unspecified: Secondary | ICD-10-CM | POA: Diagnosis not present

## 2019-03-25 DIAGNOSIS — Z170 Estrogen receptor positive status [ER+]: Secondary | ICD-10-CM | POA: Insufficient documentation

## 2019-03-25 DIAGNOSIS — E1165 Type 2 diabetes mellitus with hyperglycemia: Secondary | ICD-10-CM | POA: Diagnosis not present

## 2019-03-25 DIAGNOSIS — E1122 Type 2 diabetes mellitus with diabetic chronic kidney disease: Secondary | ICD-10-CM | POA: Diagnosis not present

## 2019-03-25 DIAGNOSIS — C50212 Malignant neoplasm of upper-inner quadrant of left female breast: Secondary | ICD-10-CM | POA: Diagnosis present

## 2019-03-25 DIAGNOSIS — Z5111 Encounter for antineoplastic chemotherapy: Secondary | ICD-10-CM | POA: Diagnosis not present

## 2019-03-25 DIAGNOSIS — F329 Major depressive disorder, single episode, unspecified: Secondary | ICD-10-CM | POA: Diagnosis not present

## 2019-03-25 DIAGNOSIS — N183 Chronic kidney disease, stage 3 (moderate): Secondary | ICD-10-CM | POA: Insufficient documentation

## 2019-03-25 DIAGNOSIS — I10 Essential (primary) hypertension: Secondary | ICD-10-CM | POA: Insufficient documentation

## 2019-03-25 DIAGNOSIS — Z7984 Long term (current) use of oral hypoglycemic drugs: Secondary | ICD-10-CM | POA: Insufficient documentation

## 2019-03-25 DIAGNOSIS — Z90710 Acquired absence of both cervix and uterus: Secondary | ICD-10-CM | POA: Insufficient documentation

## 2019-03-25 DIAGNOSIS — Z79899 Other long term (current) drug therapy: Secondary | ICD-10-CM | POA: Diagnosis not present

## 2019-03-25 DIAGNOSIS — Z803 Family history of malignant neoplasm of breast: Secondary | ICD-10-CM | POA: Insufficient documentation

## 2019-03-25 MED ORDER — PEGFILGRASTIM-CBQV 6 MG/0.6ML ~~LOC~~ SOSY
PREFILLED_SYRINGE | SUBCUTANEOUS | Status: AC
  Filled 2019-03-25: qty 0.6

## 2019-03-25 MED ORDER — PEGFILGRASTIM-CBQV 6 MG/0.6ML ~~LOC~~ SOSY
6.0000 mg | PREFILLED_SYRINGE | Freq: Once | SUBCUTANEOUS | Status: AC
  Administered 2019-03-25: 6 mg via SUBCUTANEOUS

## 2019-03-25 NOTE — Patient Instructions (Signed)

## 2019-03-27 NOTE — Progress Notes (Signed)
[place]   [phone] [phone]   Clinic Follow up Note   Patient Care Team: Sandi Mealy, MD as PCP - General (Family Medicine) Mauro Kaufmann, RN as Oncology Nurse Navigator Rockwell Germany, RN as Oncology Nurse Navigator Fanny Skates, MD as Consulting Physician (General Surgery) Truitt Merle, MD as Consulting Physician (Hematology) Kyung Rudd, MD as Consulting Physician (Radiation Oncology) 03/28/2019  CHIEF COMPLAINT: F/u left breast cancer   SUMMARY OF ONCOLOGIC HISTORY: Oncology History Overview Note  Cancer Staging Malignant neoplasm of upper-inner quadrant of left breast in female, estrogen receptor positive ([place]) Staging form: Breast, AJCC 8th Edition - Clinical stage from 12/17/2018: Stage Unknown (cTX, cN0, cM0, G2, ER+, PR+, HER2-) - Signed by Truitt Merle, MD on 12/25/2018 - Pathologic stage from 01/08/2019: Stage IA (pT1c, pN0, cM0, G3, ER+, PR+, HER2-) - Signed by Truitt Merle, MD on 01/22/2019     Malignant neoplasm of upper-inner quadrant of left breast in female, estrogen receptor positive ([place])  11/28/2018 Mammogram   Diagnostic Mammogram 11/28/18 IMPRESSION:  Suspcious mass in upper inner quadrant of the left breast  at posterior depth.    12/17/2018 Initial Biopsy   Diagnosis 12/17/18 Breast, left, needle core biopsy, upper inner quadrant - INVASIVE DUCTAL CARCINOMA, SEE COMMENT. - DUCTAL CARCINOMA IN SITU.   12/17/2018 Cancer Staging   Staging form: Breast, AJCC 8th Edition - Clinical stage from 12/17/2018: Stage Unknown (cTX, cN0, cM0, G2, ER+, PR+, HER2-) - Signed by Truitt Merle, MD on 12/25/2018   12/20/2018 Initial Diagnosis   Malignant neoplasm of upper-inner quadrant of left breast in female, estrogen receptor positive ([place])   12/20/2018 Receptors her2   Results: IMMUNOHISTOCHEMICAL AND MORPHOMETRIC ANALYSIS PERFORMED MANUALLY The tumor cells are NEGATIVE for Her2 (1+). Estrogen Receptor: 95%, POSITIVE, STRONG STAINING  INTENSITY Progesterone Receptor: 30%, POSITIVE, STRONG STAINING INTENSITY Proliferation Marker Ki67: 20%   01/08/2019 Surgery   LEFT BREAST LUMPECTOMY WITH RADIOACTIVE SEED AND LEFT AXILLARY DEEP SENTINEL LYMPH NODE BIOPSY WITH BLUE DYE INJECTION by Dr [name] 01/08/19    01/08/2019 Pathology Results   Diagnosis 01/08/19 1. Breast, lumpectomy, left - INVASIVE DUCTAL CARCINOMA, GRADE III/III, SPANNING 1.8 CM. - DUCTAL CARCINOMA IN SITU, HIGH GRADE. - DUCTAL CARCINOMA IN SITU IS FOCALLY PRESENT AT THE ANTERIOR MARGIN OF SPECIMEN #1. - DUCTAL CARCINOMA IN SITU IS FOCALLY LESS THAN 0.1 CM FROM THE INFERIOR MARGIN OF SPECIMEN # 1. - SEE ONCOLOGY TABLE BELOW. 2. Breast, excision, left medial margin - DUCTAL CARCINOMA IN SITU, FOCALLY PRESENT AT THE MEDIAL MARGIN OF SPECIMEN # 2. 3. Breast, excision, left posterior margin - BENIGN BREAST PARENCHYMA. - THERE IS NO EVIDENCE OF MALIGNANCY. - SEE COMMENT. 4. Lymph node, sentinel, biopsy, left axillary - THERE IS NO EVIDENCE OF CARCINOMA IN 1 OF 1 LYMPH NODE (0/1). 5. Lymph node, sentinel, biopsy, left axillary - THERE IS NO EVIDENCE OF CARCINOMA IN 1 OF 1 LYMPH NODE (0/1). 6. Lymph node, sentinel, biopsy, left axillary - THERE IS NO EVIDENCE OF CARCINOMA IN 1 OF 1 LYMPH NODE (0/1)   01/08/2019 Oncotype testing   recurrence score of 27 with risk of recurrence 16% with Tamoxifen or AI alone. There is a benefit of chemotherapy >15%    01/08/2019 Cancer Staging   Staging form: Breast, AJCC 8th Edition - Pathologic stage from 01/08/2019: Stage IA (pT1c, pN0, cM0, G3, ER+, PR+, HER2-) - Signed by Truitt Merle, MD on 01/22/2019   01/29/2019 Genetic Testing   Negative genetic testing on the  common hereditary cancer panel.  The Common Hereditary Gene Panel offered by [facility] includes sequencing and/or deletion duplication testing of the following 48 genes: APC, ATM, AXIN2, BARD1, BMPR1A, BRCA1, BRCA2, BRIP1, CDH1, CDK4, CDKN2A (p14ARF), CDKN2A (p16INK4a), CHEK2,  CTNNA1, DICER1, EPCAM (Deletion/duplication testing only), GREM1 (promoter region deletion/duplication testing only), KIT, MEN1, MLH1, MSH2, MSH3, MSH6, MUTYH, NBN, NF1, NHTL1, PALB2, PDGFRA, PMS2, POLD1, POLE, PTEN, RAD50, RAD51C, RAD51D, RNF43, SDHB, SDHC, SDHD, SMAD4, SMARCA4. STK11, TP53, TSC1, TSC2, and VHL.  The following genes were evaluated for sequence changes only: SDHA and HOXB13 c.251G>A variant only. The report date is January 29, 2019.   03/01/2019 Surgery   RE-EXCISION OF LEFT BREAST LUMPECTOMY MULITPLE MARGINS by Dr. [name]  Diagnosis 1. Breast, excision, re-excision left anterior margin - BENIGN BREAST PARENCHYMA WITH PREVIOUS PROCEDURE-RELATED CHANGES - NEGATIVE FOR IN SITU OR INVASIVE CARCINOMA 2. 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 SEE NOTE - DCIS IS FOCALLY LESS THAN 1 MM FROM NEW INFERIOR MARGIN - NEGATIVE FOR INVASIVE CARCINOMA - PREVIOUS PROCEDURE-RELATED CHANGES 3. Breast, excision, re-excision left medial margin - FOCAL DUCTAL CARCINOMA IN SITU (DCIS), 0.2 CM. SEE NOTE - DCIS IS 1.5 MM FROM NEW MEDIAL MARGIN - NEGATIVE FOR INVASIVE CARCINOMA - PREVIOUS PROCEDURE-RELATED CHANGES 4. Breast, excision, re-excision left final medial argin - BENIGN BREAST PARENCHYMA WITH MILD FOCAL PREVIOUS PROCEDURE-RELATED CHANGES - NEGATIVE FOR IN SITU OR INVASIVE CARCINOMA    03/22/2019 -  Chemotherapy   TC q3weeks for 4 cycles starting 03/22/19      CURRENT THERAPY: Adjuvant chemo TC q3weeks x4 cycles starting 03/22/19   INTERVAL HISTORY: Ms. Tarquinio returns for toxicity f/u as scheduled. She completed first cycle TC on 03/22/19. She received Udenyca on 03/25/19. She denies bone pain. She feels well today. Treatment went "alright." She was moderately fatigued for 2 days then improved to mildl fatigue. She rested a lot last week. She forces herself to stay active at home, lives on 2nd floor, does chores and ADLs  independently. She lives alone. Appetite is about "75%." Drinks enough, mostly water and juice. Denies dizziness. Mouth was dry and sore for few days, resolved with vanilla milkshake, never developed ulcers. She was constipated for 3 days which finally resolved when she took a dose of lactulose. No blood in stool. Mild intermittent nausea on days 4 and 5 was controlled with medication, no vomiting. Dry cough is unchanged, she attributes to allergies. Knees give out sometimes, she is asking for renewal of handicap placard.    Otherwise, denies fever, chills, chest pain, dyspnea. Denies dysuria. Denies neuropathy. Denies lacrimation.    MEDICAL HISTORY:  Past Medical History:  Diagnosis Date   Anxiety    Arthritis    right hand   Bronchitis    Cancer ([place]) 11/2018   left breast ca   Depression    Diabetes mellitus without complication ([place])    Family history of breast cancer    GERD (gastroesophageal reflux disease)    Headache    Heart murmur    Herpes 1&2    Hypertension    Leg fracture, left    as a child   OSA on CPAP    has CPAP but has not used in over a month    SURGICAL HISTORY: Past Surgical History:  Procedure Laterality Date   ABDOMINAL HYSTERECTOMY     BREAST LUMPECTOMY WITH RADIOACTIVE SEED AND SENTINEL LYMPH NODE BIOPSY Left 01/08/2019   Procedure: LEFT BREAST LUMPECTOMY  WITH RADIOACTIVE SEED AND LEFT AXILLARY DEEP SENTINEL LYMPH NODE BIOPSY WITH BLUE DYE INJECTION;  Surgeon: Fanny Skates, MD;  Location: [facility];  Service: General;  Laterality: Left;   COLONOSCOPY N/A 09/02/2015   Procedure: COLONOSCOPY;  Surgeon: Danie Binder, MD;  [facility];  Service: Endoscopy;  Laterality: N/A;  11:15 Am   EYE SURGERY Right    cataract   HAND SURGERY Right    broken bone   PORTACATH PLACEMENT N/A 03/01/2019   Procedure: INSERTION PORT-A-CATH;  Surgeon: Fanny Skates, MD;  Location: [facility];  Service: General;  Laterality:  N/A;   RE-EXCISION OF BREAST LUMPECTOMY Left 03/01/2019   Procedure: RE-EXCISION OF LEFT BREAST LUMPECTOMY MULITPLE MARGINS;  Surgeon: Fanny Skates, MD;  Location: [facility];  Service: General;  Laterality: Left;   TUBAL LIGATION      I have reviewed the social history and family history with the patient and they are unchanged from previous note.  ALLERGIES:  is allergic to asa [aspirin]; nsaids; and penicillins.  MEDICATIONS:  Current Outpatient Medications  Medication Sig Dispense Refill   acyclovir (ZOVIRAX) 400 MG tablet Take 400 mg by mouth 2 (two) times daily.     amLODipine (NORVASC) 5 MG tablet Take 5 mg by mouth daily.     Ascorbic Acid (VITAMIN C) 1000 MG tablet Take 1,000 mg by mouth daily.     atorvastatin (LIPITOR) 20 MG tablet Take 20 mg by mouth daily.     Cholecalciferol (VITAMIN D) 50 MCG (2000 UT) CAPS Take 2,000 Units by mouth daily.     dexamethasone (DECADRON) 4 MG tablet Take 1 tablet (4 mg total) by mouth 2 (two) times daily. Start the day before Taxotere. Then 1 tab daily in the day after chemo for 3 days. 30 tablet 0   fluticasone (FLONASE) 50 MCG/ACT nasal spray Place 2 sprays into both nostrils daily.      folic acid (FOLVITE) 470 MCG tablet Take 400 mcg by mouth daily.      lidocaine-prilocaine (EMLA) cream Apply to affected area once 30 g 3   lisinopril (ZESTRIL) 2.5 MG tablet Take 2.5 mg by mouth at bedtime.     loratadine (CLARITIN) 10 MG tablet Take 10 mg by mouth daily.     metFORMIN (GLUMETZA) 500 MG (MOD) 24 hr tablet Take 1,500 mg by mouth daily with breakfast.      montelukast (SINGULAIR) 10 MG tablet Take 10 mg by mouth at bedtime.     omeprazole (PRILOSEC) 20 MG capsule Take 20 mg by mouth daily.     ondansetron (ZOFRAN) 8 MG tablet Take 1 tablet (8 mg total) by mouth 2 (two) times daily as needed for refractory nausea / vomiting. Start on day 3 after chemo. 30 tablet 1   PARoxetine (PAXIL) 10 MG tablet Take 10 mg by mouth at bedtime.      potassium chloride (MICRO-K) 10 MEQ CR capsule Take 10 mEq by mouth 2 (two) times daily.     prochlorperazine (COMPAZINE) 10 MG tablet Take 1 tablet (10 mg total) by mouth every 6 (six) hours as needed (Nausea or vomiting). 30 tablet 1   tiZANidine (ZANAFLEX) 4 MG capsule Take 4 mg by mouth every 8 (eight) hours.      topiramate (TOPAMAX) 100 MG tablet Take 100 mg by mouth daily.     vitamin B-12 (CYANOCOBALAMIN) 500 MCG tablet Take 500 mcg by mouth daily.      HYDROcodone-acetaminophen (NORCO) 5-325 MG tablet  Take 1-2 tablets by mouth every 6 (six) hours as needed. 12 tablet 0   lactulose (CEPHULAC) 10 g packet Take 1 packet (10 g total) by mouth 2 (two) times daily as needed. 30 each 0   No current facility-administered medications for this visit.    Facility-Administered Medications Ordered in Other Visits  Medication Dose Route Frequency Provider Last Rate Last Dose   0.9 %  sodium chloride infusion   Intravenous Once Alla Feeling, NP 500 mL/hr at 03/28/19 3005      PHYSICAL EXAMINATION: ECOG PERFORMANCE STATUS: 2 - Symptomatic, <50% confined to bed  Vitals:   03/28/19 0827  BP: (!) 111/55  Pulse: 71  Resp: 17  Temp: 98.5 F (36.9 C)  SpO2: 100%   Filed Weights   03/28/19 0827  Weight: 262 lb 11.2 oz (119.2 kg)    GENERAL:alert, no distress and comfortable SKIN: no rash  EYES: sclera clear LUNGS: clear to auscultation with normal breathing effort HEART: regular rate & rhythm ABDOMEN:abdomen soft, non-tender and normal bowel sounds Musculoskeletal:no cyanosis of digits  NEURO: alert & oriented x 3 with fluent speech, normal gait PAC without erythema Healed left upper central breast incision, no erythema    LABORATORY DATA:  I have reviewed the data as listed CBC Latest Ref Rng & Units 03/28/2019 03/22/2019 02/21/2019  WBC 4.0 - 10.5 K/uL 5.1 13.4(H) 8.8  Hemoglobin 12.0 - 15.0 g/dL 11.5(L) 12.0 13.5  Hematocrit 36.0 - 46.0 % 37.2 39.4 44.1  Platelets 150  - 400 K/uL 240 310 318     CMP Latest Ref Rng & Units 03/28/2019 03/22/2019 02/21/2019  Glucose 70 - 99 mg/dL 373(H) 371(H) 122(H)  BUN 6 - 20 mg/dL _0 Creatinine 0.44 - 1.00 mg/dL 1.48(H) 1.56(H) 1.24(H)  Sodium 135 - 145 mmol/L 136 136 139  Potassium 3.5 - 5.1 mmol/L 4.2 4.0 4.0  Chloride 98 - 111 mmol/L 102 102 105  CO2 22 - 32 mmol/L _1 Calcium 8.9 - 10.3 mg/dL 9.5 9.9 9.3  Total Protein 6.5 - 8.1 g/dL 6.6 6.7 -  Total Bilirubin 0.3 - 1.2 mg/dL 0.7 0.4 -  Alkaline Phos 38 - 126 U/L 76 79 -  AST 15 - 41 U/L 7(L) 7(L) -  ALT 0 - 44 U/L 12 8 -      RADIOGRAPHIC STUDIES: I have personally reviewed the radiological images as listed and agreed with the findings in the report. No results found.   ASSESSMENT & PLAN: Zilla Shartzer is a 58 y.o. female with   1.Malignant neoplasm of upper-inner quadrant of left breast,pT1cN0,stage I,ER/PR+,HER2-, GradeIII, Oncotype RS 27 -Diagnosed 11/2018, s/p lumpectomy and SLNB on 01/08/19 and re-excision on 03/01/19 per Dr. [name]. -she began moderately intensive adjuvant chemo with TC on 03/22/19 and udenyca, tolerated moderately well with constipation and fatigue  2. HTN, DM with hyperglycemia -On Amlodipine, lisinopril, metformin  3.CKD stage III -secondary to DM and HTN.   4. Smoking Cessation -She smoked for 10 years before she quit. She restarted after recent cancer diagnosis   5. Genetic Testingwasnegativefor pathogenetic mutations -strong maternal family history of breast cancer   Dispo:  Ms. Duffin appears well today. She completed cycle 1 adjuvant TC with Udenyca. She tolerated moderately well with mild-moderate fatigue, constipation, and mild nausea. She has recovered well so far. She remains functional and active at home. She is getting a rollator. She is requesting lactulose Rx for constipation which works well for her. I provided  this today. Labs reviewed, Hg 11.5, CBC otherwise normal. Cr 1.48 BG 373. I  encouraged her to try to maintain tighter BG control, she is only on metformin now. She received Insulin on day 1 chemo; will give her a 4 unit dose of regular insulin today with food during IVF. She gets reduced dose post-chemo Dex. We reviewed healthy diet and adequate hydration. Will refer her to dietician. I completed and returned handicap renewal application. She will return for f/u and cycle 2 in 2 weeks.   PLAN: -NS 1 L over 2 hours -4 units insulin with food during IVF today -completed and returned handicap placard application -Rx: lactulose  -Can use biotin rinse for dry mouth -Refer to dietician for DM/education  Orders Placed This Encounter  Procedures   Ambulatory referral to Nutrition and Diabetic E    Referral Priority:   Routine    Referral Type:   Consultation    Referral Reason:   Specialty Services Required    Number of Visits Requested:   1   All questions were answered. The patient knows to call the clinic with any problems, questions or concerns. No barriers to learning was detected. I spent 20 minutes counseling the patient face to face. The total time spent in the appointment was 25 minutes and more than 50% was on counseling and review of test results     Alla Feeling, NP 03/28/19

## 2019-03-28 ENCOUNTER — Encounter: Payer: Self-pay | Admitting: Nurse Practitioner

## 2019-03-28 ENCOUNTER — Inpatient Hospital Stay: Payer: Medicare Other

## 2019-03-28 ENCOUNTER — Inpatient Hospital Stay (HOSPITAL_BASED_OUTPATIENT_CLINIC_OR_DEPARTMENT_OTHER): Payer: Medicare Other | Admitting: Nurse Practitioner

## 2019-03-28 ENCOUNTER — Other Ambulatory Visit: Payer: Self-pay

## 2019-03-28 VITALS — BP 111/55 | HR 71 | Temp 98.5°F | Resp 17 | Ht 66.0 in | Wt 262.7 lb

## 2019-03-28 DIAGNOSIS — Z5111 Encounter for antineoplastic chemotherapy: Secondary | ICD-10-CM | POA: Diagnosis not present

## 2019-03-28 DIAGNOSIS — R739 Hyperglycemia, unspecified: Secondary | ICD-10-CM | POA: Diagnosis not present

## 2019-03-28 DIAGNOSIS — Z170 Estrogen receptor positive status [ER+]: Secondary | ICD-10-CM

## 2019-03-28 DIAGNOSIS — C50212 Malignant neoplasm of upper-inner quadrant of left female breast: Secondary | ICD-10-CM

## 2019-03-28 DIAGNOSIS — Z95828 Presence of other vascular implants and grafts: Secondary | ICD-10-CM

## 2019-03-28 LAB — CBC WITH DIFFERENTIAL (CANCER CENTER ONLY)
Abs Immature Granulocytes: 0.1 10*3/uL — ABNORMAL HIGH (ref 0.00–0.07)
Band Neutrophils: 5 %
Basophils Absolute: 0 10*3/uL (ref 0.0–0.1)
Basophils Relative: 0 %
Eosinophils Absolute: 0 10*3/uL (ref 0.0–0.5)
Eosinophils Relative: 0 %
HCT: 37.2 % (ref 36.0–46.0)
Hemoglobin: 11.5 g/dL — ABNORMAL LOW (ref 12.0–15.0)
Lymphocytes Relative: 17 %
Lymphs Abs: 0.9 10*3/uL (ref 0.7–4.0)
MCH: 28.3 pg (ref 26.0–34.0)
MCHC: 30.9 g/dL (ref 30.0–36.0)
MCV: 91.6 fL (ref 80.0–100.0)
Metamyelocytes Relative: 1 %
Monocytes Absolute: 0.2 10*3/uL (ref 0.1–1.0)
Monocytes Relative: 3 %
Neutro Abs: 4 10*3/uL (ref 1.7–17.7)
Neutrophils Relative %: 74 %
Platelet Count: 240 10*3/uL (ref 150–400)
RBC: 4.06 MIL/uL (ref 3.87–5.11)
RDW: 13.7 % (ref 11.5–15.5)
WBC Count: 5.1 10*3/uL (ref 4.0–10.5)
nRBC: 0 % (ref 0.0–0.2)

## 2019-03-28 LAB — GLUCOSE, CAPILLARY: Glucose-Capillary: 262 mg/dL — ABNORMAL HIGH (ref 70–99)

## 2019-03-28 LAB — CMP (CANCER CENTER ONLY)
ALT: 12 U/L (ref 0–44)
AST: 7 U/L — ABNORMAL LOW (ref 15–41)
Albumin: 3.8 g/dL (ref 3.5–5.0)
Alkaline Phosphatase: 76 U/L (ref 38–126)
Anion gap: 11 (ref 5–15)
BUN: 20 mg/dL (ref 6–20)
CO2: 23 mmol/L (ref 22–32)
Calcium: 9.5 mg/dL (ref 8.9–10.3)
Chloride: 102 mmol/L (ref 98–111)
Creatinine: 1.48 mg/dL — ABNORMAL HIGH (ref 0.44–1.00)
GFR, Est AFR Am: 45 mL/min — ABNORMAL LOW (ref >60)
GFR, Estimated: 39 mL/min — ABNORMAL LOW (ref >60)
Glucose, Bld: 373 mg/dL — ABNORMAL HIGH (ref 70–99)
Potassium: 4.2 mmol/L (ref 3.5–5.1)
Sodium: 136 mmol/L (ref 135–145)
Total Bilirubin: 0.7 mg/dL (ref 0.3–1.2)
Total Protein: 6.6 g/dL (ref 6.5–8.1)

## 2019-03-28 MED ORDER — INSULIN REGULAR HUMAN 100 UNIT/ML IJ SOLN
4.0000 [IU] | Freq: Once | INTRAMUSCULAR | Status: AC
  Administered 2019-03-28: 4 [IU] via SUBCUTANEOUS
  Filled 2019-03-28: qty 10

## 2019-03-28 MED ORDER — SODIUM CHLORIDE 0.9 % IV SOLN
Freq: Once | INTRAVENOUS | Status: AC
  Administered 2019-03-28: 09:00:00 via INTRAVENOUS
  Filled 2019-03-28: qty 250

## 2019-03-28 MED ORDER — SODIUM CHLORIDE 0.9% FLUSH
10.0000 mL | Freq: Once | INTRAVENOUS | Status: AC
  Administered 2019-03-28: 10 mL via INTRAVENOUS
  Filled 2019-03-28: qty 10

## 2019-03-28 MED ORDER — SODIUM CHLORIDE 0.9% FLUSH
10.0000 mL | Freq: Once | INTRAVENOUS | Status: AC
  Administered 2019-03-28: 10 mL
  Filled 2019-03-28: qty 10

## 2019-03-28 MED ORDER — LACTULOSE 10 G PO PACK: 10.0000 g | PACK | Freq: Two times a day (BID) | ORAL | 0 refills | Status: DC | PRN

## 2019-03-28 MED ORDER — HEPARIN SOD (PORK) LOCK FLUSH 100 UNIT/ML IV SOLN
500.0000 [IU] | Freq: Once | INTRAVENOUS | Status: AC
  Administered 2019-03-28: 500 [IU] via INTRAVENOUS
  Filled 2019-03-28: qty 5

## 2019-03-28 NOTE — Patient Instructions (Signed)

## 2019-03-28 NOTE — Progress Notes (Signed)
Blood sugar 273 upon recheck after receiving 4 units of Regular insulin.

## 2019-03-29 ENCOUNTER — Other Ambulatory Visit: Payer: Self-pay | Admitting: Hematology

## 2019-03-29 ENCOUNTER — Telehealth: Payer: Self-pay

## 2019-03-29 ENCOUNTER — Telehealth: Payer: Self-pay | Admitting: Hematology

## 2019-03-29 MED ORDER — HYDROCODONE-ACETAMINOPHEN 5-325 MG PO TABS: 1.0000 | ORAL_TABLET | Freq: Four times a day (QID) | ORAL | 0 refills | Status: DC | PRN

## 2019-03-29 NOTE — Telephone Encounter (Signed)
Patient calls stating she had treatment yesterday, having back pain radiating down her legs, Tylenol is not helping, she has tried taking her muscle relaxer which is not helping either.  She is requesting pain medication.  [phone]

## 2019-03-29 NOTE — Telephone Encounter (Signed)
Reported severe back pain which radiates down to her legs, since.  Udenyca injection, no fever or chills.  She has tried Tylenol which did not help.  This is probably related to the [facility], she used hydrocodone after her breast surgery, which was very helpful.  I refilled hydrocodone/acetaminophen for 15 tablets, she will alternate with Tylenol for her pain.  Truitt Merle  03/29/2019

## 2019-04-03 ENCOUNTER — Telehealth: Payer: Self-pay

## 2019-04-03 NOTE — Telephone Encounter (Signed)
Returned pharmacies call in regards to [employer]. Per Cira Rue ok to give pt liquid lactolose instead of powder. Pharmacist verbalized understanding.

## 2019-04-11 ENCOUNTER — Other Ambulatory Visit: Payer: Self-pay | Admitting: *Deleted

## 2019-04-11 MED ORDER — LACTULOSE 10 GM/15ML PO SOLN: 10.0000 g | Freq: Two times a day (BID) | ORAL | 0 refills | Status: DC

## 2019-04-11 NOTE — Telephone Encounter (Signed)
TCT patient to advise that her Lactulose has been changed to the liquid formula. No answer but was able to leave vm message. Advised that if phad questions she can call back to [phone]

## 2019-04-11 NOTE — Progress Notes (Signed)
Emma Casey   [phone] [phone]   Clinic Follow up Note   Patient Care Team: Sandi Mealy, MD as PCP - General (Family Medicine) Mauro Kaufmann, RN as Oncology Nurse Navigator Rockwell Germany, RN as Oncology Nurse Navigator Fanny Skates, MD as Consulting Physician (General Surgery) Truitt Merle, MD as Consulting Physician (Hematology) Kyung Rudd, MD as Consulting Physician (Radiation Oncology)  Date of Service:  04/12/2019  CHIEF COMPLAINT: f/u of left breast cancer  SUMMARY OF ONCOLOGIC HISTORY: Oncology History Overview Note  Cancer Staging Malignant neoplasm of upper-inner quadrant of left breast in female, estrogen receptor positive ([place]) Staging form: Breast, AJCC 8th Edition - Clinical stage from 12/17/2018: Stage Unknown (cTX, cN0, cM0, G2, ER+, PR+, HER2-) - Signed by Truitt Merle, MD on 12/25/2018 - Pathologic stage from 01/08/2019: Stage IA (pT1c, pN0, cM0, G3, ER+, PR+, HER2-) - Signed by Truitt Merle, MD on 01/22/2019     Malignant neoplasm of upper-inner quadrant of left breast in female, estrogen receptor positive ([place])  11/28/2018 Mammogram   Diagnostic Mammogram 11/28/18 IMPRESSION:  Suspcious mass in upper inner quadrant of the left breast  at posterior depth.    12/17/2018 Initial Biopsy   Diagnosis 12/17/18 Breast, left, needle core biopsy, upper inner quadrant - INVASIVE DUCTAL CARCINOMA, SEE COMMENT. - DUCTAL CARCINOMA IN SITU.   12/17/2018 Cancer Staging   Staging form: Breast, AJCC 8th Edition - Clinical stage from 12/17/2018: Stage Unknown (cTX, cN0, cM0, G2, ER+, PR+, HER2-) - Signed by Truitt Merle, MD on 12/25/2018   12/20/2018 Initial Diagnosis   Malignant neoplasm of upper-inner quadrant of left breast in female, estrogen receptor positive ([place])   12/20/2018 Receptors her2   Results: IMMUNOHISTOCHEMICAL AND MORPHOMETRIC ANALYSIS PERFORMED MANUALLY The tumor cells are NEGATIVE for Her2 (1+). Estrogen Receptor: 95%,  POSITIVE, STRONG STAINING INTENSITY Progesterone Receptor: 30%, POSITIVE, STRONG STAINING INTENSITY Proliferation Marker Ki67: 20%   01/08/2019 Surgery   LEFT BREAST LUMPECTOMY WITH RADIOACTIVE SEED AND LEFT AXILLARY DEEP SENTINEL LYMPH NODE BIOPSY WITH BLUE DYE INJECTION by Dr [name] 01/08/19    01/08/2019 Pathology Results   Diagnosis 01/08/19 1. Breast, lumpectomy, left - INVASIVE DUCTAL CARCINOMA, GRADE III/III, SPANNING 1.8 CM. - DUCTAL CARCINOMA IN SITU, HIGH GRADE. - DUCTAL CARCINOMA IN SITU IS FOCALLY PRESENT AT THE ANTERIOR MARGIN OF SPECIMEN #1. - DUCTAL CARCINOMA IN SITU IS FOCALLY LESS THAN 0.1 CM FROM THE INFERIOR MARGIN OF SPECIMEN # 1. - SEE ONCOLOGY TABLE BELOW. 2. Breast, excision, left medial margin - DUCTAL CARCINOMA IN SITU, FOCALLY PRESENT AT THE MEDIAL MARGIN OF SPECIMEN # 2. 3. Breast, excision, left posterior margin - BENIGN BREAST PARENCHYMA. - THERE IS NO EVIDENCE OF MALIGNANCY. - SEE COMMENT. 4. Lymph node, sentinel, biopsy, left axillary - THERE IS NO EVIDENCE OF CARCINOMA IN 1 OF 1 LYMPH NODE (0/1). 5. Lymph node, sentinel, biopsy, left axillary - THERE IS NO EVIDENCE OF CARCINOMA IN 1 OF 1 LYMPH NODE (0/1). 6. Lymph node, sentinel, biopsy, left axillary - THERE IS NO EVIDENCE OF CARCINOMA IN 1 OF 1 LYMPH NODE (0/1)   01/08/2019 Oncotype testing   recurrence score of 27 with risk of recurrence 16% with Tamoxifen or AI alone. There is a benefit of chemotherapy >15%    01/08/2019 Cancer Staging   Staging form: Breast, AJCC 8th Edition - Pathologic stage from 01/08/2019: Stage IA (pT1c, pN0, cM0, G3, ER+, PR+, HER2-) - Signed by Truitt Merle, MD on 01/22/2019   01/29/2019 Genetic Testing  Negative genetic testing on the common hereditary cancer panel.  The Common Hereditary Gene Panel offered by [facility] includes sequencing and/or deletion duplication testing of the following 48 genes: APC, ATM, AXIN2, BARD1, BMPR1A, BRCA1, BRCA2, BRIP1, CDH1, CDK4, CDKN2A (p14ARF),  CDKN2A (p16INK4a), CHEK2, CTNNA1, DICER1, EPCAM (Deletion/duplication testing only), GREM1 (promoter region deletion/duplication testing only), KIT, MEN1, MLH1, MSH2, MSH3, MSH6, MUTYH, NBN, NF1, NHTL1, PALB2, PDGFRA, PMS2, POLD1, POLE, PTEN, RAD50, RAD51C, RAD51D, RNF43, SDHB, SDHC, SDHD, SMAD4, SMARCA4. STK11, TP53, TSC1, TSC2, and VHL.  The following genes were evaluated for sequence changes only: SDHA and HOXB13 c.251G>A variant only. The report date is January 29, 2019.   03/01/2019 Surgery   RE-EXCISION OF LEFT BREAST LUMPECTOMY MULITPLE MARGINS by Dr. [name]  Diagnosis 1. Breast, excision, re-excision left anterior margin - BENIGN BREAST PARENCHYMA WITH PREVIOUS PROCEDURE-RELATED CHANGES - NEGATIVE FOR IN SITU OR INVASIVE CARCINOMA 2. 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 SEE NOTE - DCIS IS FOCALLY LESS THAN 1 MM FROM NEW INFERIOR MARGIN - NEGATIVE FOR INVASIVE CARCINOMA - PREVIOUS PROCEDURE-RELATED CHANGES 3. Breast, excision, re-excision left medial margin - FOCAL DUCTAL CARCINOMA IN SITU (DCIS), 0.2 CM. SEE NOTE - DCIS IS 1.5 MM FROM NEW MEDIAL MARGIN - NEGATIVE FOR INVASIVE CARCINOMA - PREVIOUS PROCEDURE-RELATED CHANGES 4. Breast, excision, re-excision left final medial argin - BENIGN BREAST PARENCHYMA WITH MILD FOCAL PREVIOUS PROCEDURE-RELATED CHANGES - NEGATIVE FOR IN SITU OR INVASIVE CARCINOMA    03/22/2019 -  Chemotherapy   TC q3weeks for 4 cycles starting 03/22/19       CURRENT THERAPY:  Adjuvant chemo TCq3weeks for 4 cyclesstarting 03/22/19  INTERVAL HISTORY:  Emma Casey is here for a follow up and treatment. She presents to the clinic alone. She notes her scalp feels sensitive to her haircuts. She feels this scalp sensitivity effects her sleep. She did not sleep well last night so she feels tired today. She notes she is eating fairly. She feels it is still adequately. She notes her constipation resolved  with lactulose. She notes she has no other concerns.     REVIEW OF SYSTEMS:   Constitutional: Denies fevers, chills or abnormal weight loss Eyes: Denies blurriness of vision Ears, nose, mouth, throat, and face: Denies mucositis or sore throat Respiratory: Denies cough, dyspnea or wheezes Cardiovascular: Denies palpitation, chest discomfort or lower extremity swelling Gastrointestinal:  Denies nausea, heartburn or change in bowel habits Skin: Denies abnormal skin rashes (+) Hair loss (+) Scalp sensitivity  Lymphatics: Denies new lymphadenopathy or easy bruising Neurological:Denies numbness, tingling or new weaknesses Behavioral/Psych: Mood is stable, no new changes  All other systems were reviewed with the patient and are negative.  MEDICAL HISTORY:  Past Medical History:  Diagnosis Date  . Anxiety   . Arthritis    right hand  . Bronchitis   . Cancer ([place]) 11/2018   left breast ca  . Depression   . Diabetes mellitus without complication ([place])   . Family history of breast cancer   . GERD (gastroesophageal reflux disease)   . Headache   . Heart murmur   . Herpes 1&2   . Hypertension   . Leg fracture, left    as a child  . OSA on CPAP    has CPAP but has not used in over a month    SURGICAL HISTORY: Past Surgical History:  Procedure Laterality Date  . ABDOMINAL HYSTERECTOMY    . BREAST LUMPECTOMY WITH RADIOACTIVE SEED AND SENTINEL  LYMPH NODE BIOPSY Left 01/08/2019   Procedure: LEFT BREAST LUMPECTOMY WITH RADIOACTIVE SEED AND LEFT AXILLARY DEEP SENTINEL LYMPH NODE BIOPSY WITH BLUE DYE INJECTION;  Surgeon: Fanny Skates, MD;  Location: [facility];  Service: General;  Laterality: Left;  . COLONOSCOPY N/A 09/02/2015   Procedure: COLONOSCOPY;  Surgeon: Danie Binder, MD;  [facility];  Service: Endoscopy;  Laterality: N/A;  11:15 Am  . EYE SURGERY Right    cataract  . HAND SURGERY Right    broken bone  . PORTACATH PLACEMENT N/A 03/01/2019    Procedure: INSERTION PORT-A-CATH;  Surgeon: Fanny Skates, MD;  [facility];  Service: General;  Laterality: N/A;  . RE-EXCISION OF BREAST LUMPECTOMY Left 03/01/2019   Procedure: RE-EXCISION OF LEFT BREAST LUMPECTOMY MULITPLE MARGINS;  Surgeon: Fanny Skates, MD;  Location: [facility];  Service: General;  Laterality: Left;  . TUBAL LIGATION      I have reviewed the social history and family history with the patient and they are unchanged from previous note.  ALLERGIES:  is allergic to asa [aspirin]; nsaids; and penicillins.  MEDICATIONS:  Current Outpatient Medications  Medication Sig Dispense Refill  . acyclovir (ZOVIRAX) 400 MG tablet Take 400 mg by mouth 2 (two) times daily.    Marland Kitchen amLODipine (NORVASC) 5 MG tablet Take 5 mg by mouth daily.    . Ascorbic Acid (VITAMIN C) 1000 MG tablet Take 1,000 mg by mouth daily.    Marland Kitchen atorvastatin (LIPITOR) 20 MG tablet Take 20 mg by mouth daily.    . Cholecalciferol (VITAMIN D) 50 MCG (2000 UT) CAPS Take 2,000 Units by mouth daily.    Marland Kitchen dexamethasone (DECADRON) 4 MG tablet Take 1 tablet (4 mg total) by mouth 2 (two) times daily. Start the day before Taxotere. Then 1 tab daily in the day after chemo for 3 days. 30 tablet 0  . fluticasone (FLONASE) 50 MCG/ACT nasal spray Place 2 sprays into both nostrils daily.     . folic acid (FOLVITE) 947 MCG tablet Take 400 mcg by mouth daily.     Marland Kitchen HYDROcodone-acetaminophen (NORCO) 5-325 MG tablet Take 1-2 tablets by mouth every 6 (six) hours as needed. 15 tablet 0  . lactulose (CHRONULAC) 10 GM/15ML solution Take 15 mLs (10 g total) by mouth 2 (two) times daily. 236 mL 0  . lidocaine-prilocaine (EMLA) cream Apply to affected area once 30 g 3  . lisinopril (ZESTRIL) 2.5 MG tablet Take 2.5 mg by mouth at bedtime.    Marland Kitchen loratadine (CLARITIN) 10 MG tablet Take 10 mg by mouth daily.    . metFORMIN (GLUMETZA) 500 MG (MOD) 24 hr tablet Take 1,500 mg by mouth daily with breakfast.     . montelukast (SINGULAIR) 10 MG tablet  Take 10 mg by mouth at bedtime.    Marland Kitchen omeprazole (PRILOSEC) 20 MG capsule Take 20 mg by mouth daily.    . ondansetron (ZOFRAN) 8 MG tablet Take 1 tablet (8 mg total) by mouth 2 (two) times daily as needed for refractory nausea / vomiting. Start on day 3 after chemo. 30 tablet 1  . PARoxetine (PAXIL) 10 MG tablet Take 10 mg by mouth at bedtime.    . potassium chloride (MICRO-K) 10 MEQ CR capsule Take 10 mEq by mouth 2 (two) times daily.    . prochlorperazine (COMPAZINE) 10 MG tablet Take 1 tablet (10 mg total) by mouth every 6 (six) hours as needed (Nausea or vomiting). 30 tablet 1  . tiZANidine (ZANAFLEX)  4 MG capsule Take 4 mg by mouth every 8 (eight) hours.     . topiramate (TOPAMAX) 100 MG tablet Take 100 mg by mouth daily.    . vitamin B-12 (CYANOCOBALAMIN) 500 MCG tablet Take 500 mcg by mouth daily.      No current facility-administered medications for this visit.     PHYSICAL EXAMINATION: ECOG PERFORMANCE STATUS: 2 - Symptomatic, <50% confined to bed  Vitals:   04/12/19 0927  BP: 130/78  Pulse: 74  Resp: 17  Temp: 98.5 F (36.9 C)  SpO2: 100%   Filed Weights   04/12/19 0927  Weight: 259 lb 6.4 oz (117.7 kg)    GENERAL:alert, no distress and comfortable SKIN: skin color, texture, turgor are normal, no rashes or significant lesions EYES: normal, Conjunctiva are pink and non-injected, sclera clear  NECK: supple, thyroid normal size, non-tender, without nodularity LYMPH:  no palpable lymphadenopathy in the cervical, axillary  LUNGS: clear to auscultation and percussion with normal breathing effort HEART: regular rate & rhythm and no murmurs and no lower extremity edema ABDOMEN:abdomen soft, non-tender and normal bowel sounds Musculoskeletal:no cyanosis of digits and no clubbing  NEURO: alert & oriented x 3 with fluent speech, no focal motor/sensory deficits  LABORATORY DATA:  I have reviewed the data as listed CBC Latest Ref Rng & Units 04/12/2019 03/28/2019 03/22/2019  WBC  4.0 - 10.5 K/uL 10.8(H) 5.1 13.4(H)  Hemoglobin 12.0 - 15.0 g/dL 12.6 11.5(L) 12.0  Hematocrit 36.0 - 46.0 % 40.7 37.2 39.4  Platelets 150 - 400 K/uL 364 240 310     CMP Latest Ref Rng & Units 04/12/2019 03/28/2019 03/22/2019  Glucose 70 - 99 mg/dL 279(H) 373(H) 371(H)  BUN 6 - 20 mg/dL 13 20 16   Creatinine 0.44 - 1.00 mg/dL 1.23(H) 1.48(H) 1.56(H)  Sodium 135 - 145 mmol/L 140 136 136  Potassium 3.5 - 5.1 mmol/L 4.0 4.2 4.0  Chloride 98 - 111 mmol/L 106 102 102  CO2 22 - 32 mmol/L 27 23 25   Calcium 8.9 - 10.3 mg/dL 9.2 9.5 9.9  Total Protein 6.5 - 8.1 g/dL 6.4(L) 6.6 6.7  Total Bilirubin 0.3 - 1.2 mg/dL 0.7 0.7 0.4  Alkaline Phos 38 - 126 U/L 82 76 79  AST 15 - 41 U/L 8(L) 7(L) 7(L)  ALT 0 - 44 U/L 10 12 8       RADIOGRAPHIC STUDIES: I have personally reviewed the radiological images as listed and agreed with the findings in the report. No results found.   ASSESSMENT & PLAN:  Emma Casey is a 58 y.o. female with   1.Malignant neoplasm of upper-inner quadrant of left breast,pT1cN0,stage I,ER/PR+,HER2-, GradeIII, Oncotype RS 27 -She was diagnosed in 11/2018.  -She underwentleftbreastlumpectomyand SLN biopsyon 01/08/19. We discussedher pathology report which shows 1.8 cmIDC with DCIS,grade III,all 3nodesnegative. Her surgical margins were positive for DCIS. -Given positive margins she underwent left re-excision on 03/01/19.Pathology with negative margins. She is recovering well with left breast shooting pain. I discussed this will improve over time but may not completely resolve.  -Based on her RS 27, she will benefit from adjuvant chemo to reduce her risk of recurrence followed by RT and anti-estrogen therapy. -I started her on moderately intensive adjuvant chemo with docetaxel and cytoxan (TC)q3weeks for 4 cycles on 03/22/19  -She tolerated first cycle mostly well with fatigue and constipation. Constipation resolved with lactulose. She has more hair loss and with  this she has scalp sensitivity. This can effect her sleep.  -Labs reviewed, CBC and  CMP WNL except WBC 10.8, BG 279, Cr 1.23. Overall adequate to proceed with C2 TC today.  -I encouraged her to drink and eat adequately as she continues chemo. I discussed her chemo related side effects may continue to accumulate.  -F/u in 3 weeks. I strongly encouraged her to contact clinic or come in if she develops significant or unexpected side effects.    2. HTN, DM with hyperglycemia  -On Amlodipine, lisinopril, metformin -HTN controlled -Will watch closely during chemo and pre-med dexa can effects her BG and BP.I also encouraged her to monitor at home.  -she has poorly controlled hyperglycemia. I instructed her to reduce her sugar intake in the next week. I have already reduced her dexa pre-med dose.   3.CKD stage III -secondary to DM and HTN.  -I encouraged her to drink plenty of water and to avoid NSAID's -Will monitor   4. Smoking Cessation  -She smoked for 10 years before she quit. She restarted after recent cancer diagnosis  -I strongly encouraged her to quit smoking completely and continue cessation. She understands and agrees.  5. Genetic Testingwasnegativefor pathogenetic mutations -Patient recently found out other than her maternal aunt, 3 of her maternal first cousins have breast cancer as well.    PLAN: -Lab reviewed and adequate to proceed with C2 TC today  -f/u with PCP for her hyperglycemia  -Lab, flush, f/u and TC in 3 weeks   No problem-specific Assessment & Plan notes found for this encounter.   No orders of the defined types were placed in this encounter.  All questions were answered. The patient knows to call the clinic with any problems, questions or concerns. No barriers to learning was detected. I spent 20 minutes counseling the patient face to face. The total time spent in the appointment was 25 minutes and more than 50% was on counseling and review of  test results     Truitt Merle, MD 04/12/2019   I, Joslyn Devon, am acting as scribe for Truitt Merle, MD.   I have reviewed the above documentation for accuracy and completeness, and I agree with the above.

## 2019-04-12 ENCOUNTER — Telehealth: Payer: Self-pay | Admitting: Hematology

## 2019-04-12 ENCOUNTER — Inpatient Hospital Stay: Payer: Medicare Other

## 2019-04-12 ENCOUNTER — Other Ambulatory Visit: Payer: Self-pay

## 2019-04-12 ENCOUNTER — Encounter: Payer: Self-pay | Admitting: Hematology

## 2019-04-12 ENCOUNTER — Inpatient Hospital Stay (HOSPITAL_BASED_OUTPATIENT_CLINIC_OR_DEPARTMENT_OTHER): Payer: Medicare Other | Admitting: Hematology

## 2019-04-12 VITALS — BP 130/78 | HR 74 | Temp 98.5°F | Resp 17 | Ht 66.0 in | Wt 259.4 lb

## 2019-04-12 DIAGNOSIS — C50212 Malignant neoplasm of upper-inner quadrant of left female breast: Secondary | ICD-10-CM

## 2019-04-12 DIAGNOSIS — R739 Hyperglycemia, unspecified: Secondary | ICD-10-CM

## 2019-04-12 DIAGNOSIS — Z170 Estrogen receptor positive status [ER+]: Secondary | ICD-10-CM

## 2019-04-12 DIAGNOSIS — Z95828 Presence of other vascular implants and grafts: Secondary | ICD-10-CM

## 2019-04-12 DIAGNOSIS — Z5111 Encounter for antineoplastic chemotherapy: Secondary | ICD-10-CM | POA: Diagnosis not present

## 2019-04-12 LAB — CMP (CANCER CENTER ONLY)
ALT: 10 U/L (ref 0–44)
AST: 8 U/L — ABNORMAL LOW (ref 15–41)
Albumin: 3.6 g/dL (ref 3.5–5.0)
Alkaline Phosphatase: 82 U/L (ref 38–126)
Anion gap: 7 (ref 5–15)
BUN: 13 mg/dL (ref 6–20)
CO2: 27 mmol/L (ref 22–32)
Calcium: 9.2 mg/dL (ref 8.9–10.3)
Chloride: 106 mmol/L (ref 98–111)
Creatinine: 1.23 mg/dL — ABNORMAL HIGH (ref 0.44–1.00)
GFR, Est AFR Am: 56 mL/min — ABNORMAL LOW (ref >60)
GFR, Estimated: 48 mL/min — ABNORMAL LOW (ref >60)
Glucose, Bld: 279 mg/dL — ABNORMAL HIGH (ref 70–99)
Potassium: 4 mmol/L (ref 3.5–5.1)
Sodium: 140 mmol/L (ref 135–145)
Total Bilirubin: 0.7 mg/dL (ref 0.3–1.2)
Total Protein: 6.4 g/dL — ABNORMAL LOW (ref 6.5–8.1)

## 2019-04-12 LAB — CBC WITH DIFFERENTIAL (CANCER CENTER ONLY)
Abs Immature Granulocytes: 0.06 K/uL (ref 0.00–0.07)
Basophils Absolute: 0.1 K/uL (ref 0.0–0.1)
Basophils Relative: 1 %
Eosinophils Absolute: 0.1 K/uL (ref 0.0–0.5)
Eosinophils Relative: 1 %
HCT: 40.7 % (ref 36.0–46.0)
Hemoglobin: 12.6 g/dL (ref 12.0–15.0)
Immature Granulocytes: 1 %
Lymphocytes Relative: 21 %
Lymphs Abs: 2.3 K/uL (ref 0.7–4.0)
MCH: 28.4 pg (ref 26.0–34.0)
MCHC: 31 g/dL (ref 30.0–36.0)
MCV: 91.7 fL (ref 80.0–100.0)
Monocytes Absolute: 0.7 K/uL (ref 0.1–1.0)
Monocytes Relative: 6 %
Neutro Abs: 7.6 K/uL (ref 1.7–7.7)
Neutrophils Relative %: 70 %
Platelet Count: 364 K/uL (ref 150–400)
RBC: 4.44 MIL/uL (ref 3.87–5.11)
RDW: 15.4 % (ref 11.5–15.5)
WBC Count: 10.8 K/uL — ABNORMAL HIGH (ref 4.0–10.5)
nRBC: 0 % (ref 0.0–0.2)

## 2019-04-12 MED ORDER — SODIUM CHLORIDE 0.9% FLUSH
10.0000 mL | INTRAVENOUS | Status: DC | PRN
  Administered 2019-04-12: 10 mL
  Filled 2019-04-12: qty 10

## 2019-04-12 MED ORDER — SODIUM CHLORIDE 0.9 % IV SOLN
75.0000 mg/m2 | Freq: Once | INTRAVENOUS | Status: AC
  Administered 2019-04-12: 12:00:00 180 mg via INTRAVENOUS
  Filled 2019-04-12: qty 18

## 2019-04-12 MED ORDER — DEXAMETHASONE SODIUM PHOSPHATE 10 MG/ML IJ SOLN
10.0000 mg | Freq: Once | INTRAMUSCULAR | Status: AC
  Administered 2019-04-12: 10 mg via INTRAVENOUS

## 2019-04-12 MED ORDER — PALONOSETRON HCL INJECTION 0.25 MG/5ML
0.2500 mg | Freq: Once | INTRAVENOUS | Status: AC
  Administered 2019-04-12: 11:00:00 0.25 mg via INTRAVENOUS

## 2019-04-12 MED ORDER — HEPARIN SOD (PORK) LOCK FLUSH 100 UNIT/ML IV SOLN
500.0000 [IU] | Freq: Once | INTRAVENOUS | Status: AC | PRN
  Administered 2019-04-12: 13:00:00 500 [IU]
  Filled 2019-04-12: qty 5

## 2019-04-12 MED ORDER — SODIUM CHLORIDE 0.9% FLUSH
10.0000 mL | Freq: Once | INTRAVENOUS | Status: AC
  Administered 2019-04-12: 10 mL
  Filled 2019-04-12: qty 10

## 2019-04-12 MED ORDER — DEXAMETHASONE SODIUM PHOSPHATE 10 MG/ML IJ SOLN
INTRAMUSCULAR | Status: AC
  Filled 2019-04-12: qty 1

## 2019-04-12 MED ORDER — SODIUM CHLORIDE 0.9 % IV SOLN
600.0000 mg/m2 | Freq: Once | INTRAVENOUS | Status: AC
  Administered 2019-04-12: 13:00:00 1420 mg via INTRAVENOUS
  Filled 2019-04-12: qty 71

## 2019-04-12 MED ORDER — PALONOSETRON HCL INJECTION 0.25 MG/5ML
INTRAVENOUS | Status: AC
  Filled 2019-04-12: qty 5

## 2019-04-12 MED ORDER — SODIUM CHLORIDE 0.9 % IV SOLN
Freq: Once | INTRAVENOUS | Status: AC
  Administered 2019-04-12: 12:00:00 via INTRAVENOUS
  Filled 2019-04-12: qty 250

## 2019-04-12 NOTE — Patient Instructions (Signed)
[facility] Discharge Instructions for Patients Receiving Chemotherapy  Today you received the following chemotherapy agents Taxotere and Cytoxan  To help prevent nausea and vomiting after your treatment, we encourage you to take your nausea medication as directed.    If you develop nausea and vomiting that is not controlled by your nausea medication, call the clinic.   BELOW ARE SYMPTOMS THAT SHOULD BE REPORTED IMMEDIATELY:  *FEVER GREATER THAN 100.5 F  *CHILLS WITH OR WITHOUT FEVER  NAUSEA AND VOMITING THAT IS NOT CONTROLLED WITH YOUR NAUSEA MEDICATION  *UNUSUAL SHORTNESS OF BREATH  *UNUSUAL BRUISING OR BLEEDING  TENDERNESS IN MOUTH AND THROAT WITH OR WITHOUT PRESENCE OF ULCERS  *URINARY PROBLEMS  *BOWEL PROBLEMS  UNUSUAL RASH Items with * indicate a potential emergency and should be followed up as soon as possible.  Feel free to call the clinic should you have any questions or concerns. The clinic phone number is [phone].  Please show the CHEMO ALERT CARD at check-in to the Emergency Department and triage nurse.   

## 2019-04-12 NOTE — Patient Instructions (Signed)

## 2019-04-15 ENCOUNTER — Other Ambulatory Visit: Payer: Self-pay

## 2019-04-15 ENCOUNTER — Inpatient Hospital Stay: Payer: Medicare Other | Admitting: Nutrition

## 2019-04-15 ENCOUNTER — Inpatient Hospital Stay: Payer: Medicare Other

## 2019-04-15 ENCOUNTER — Telehealth: Payer: Self-pay | Admitting: Hematology

## 2019-04-15 DIAGNOSIS — Z5111 Encounter for antineoplastic chemotherapy: Secondary | ICD-10-CM | POA: Diagnosis not present

## 2019-04-15 DIAGNOSIS — C50212 Malignant neoplasm of upper-inner quadrant of left female breast: Secondary | ICD-10-CM

## 2019-04-15 MED ORDER — PEGFILGRASTIM-CBQV 6 MG/0.6ML ~~LOC~~ SOSY
PREFILLED_SYRINGE | SUBCUTANEOUS | Status: AC
  Filled 2019-04-15: qty 0.6

## 2019-04-15 MED ORDER — PEGFILGRASTIM-CBQV 6 MG/0.6ML ~~LOC~~ SOSY
6.0000 mg | PREFILLED_SYRINGE | Freq: Once | SUBCUTANEOUS | Status: AC
  Administered 2019-04-15: 6 mg via SUBCUTANEOUS

## 2019-04-15 NOTE — Progress Notes (Signed)
58 year old female diagnosed with breast cancer. Referral secondary to high blood sugar values.  Past medical history includes anxiety, depression, diabetes, GERD, hypertension, OSA on CPAP, chronic kidney disease stage III.  Medications include vitamin C, Lipitor, vitamin D, Decadron, lactulose, Prilosec, Zofran, Compazine, and vitamin B12.  Labs include glucose 279, creatinine 1.23, albumin 3.6.  Height: 5 feet 6 inches. Weight: 259.4 pounds August 21. Usual body weight: 260-264 pounds. BMI: 41.87.  Patient reports she has never been formally educated on her diabetic diet. She knows she should not eat a lot of bread. Reports occasionally drinking sugar sweetened beverages. She does eat desserts. Reports some nausea after chemotherapy however nausea medication helps. She does not follow any special diet.  Nutrition diagnosis: Food and nutrition related knowledge deficit related to breast cancer and elevated glucose levels as evidenced by glucose of 279.  Intervention: Educated patient on no concentrated sweets diet. Stressed importance of avoiding concentrated sugars especially in soft drinks and sweet tea. Encourage patient to consume a balance of protein fat and carbohydrate at mealtimes and at snack times. We will email fact sheets to patient. Questions were answered teach back method used. Patient should be referred to nutrition and diabetes education services for further diabetic education.  Nutrition diagnosis resolved; no follow-up required.

## 2019-04-15 NOTE — Patient Instructions (Signed)

## 2019-04-15 NOTE — Telephone Encounter (Signed)
No los per 8/21.

## 2019-04-18 ENCOUNTER — Encounter: Payer: Self-pay | Admitting: Hematology

## 2019-04-26 NOTE — Progress Notes (Signed)
Emma Casey   [phone]) [phone] [phone]   Clinic Follow up Note   Patient Care Team: Sandi Mealy, MD as PCP - General (Family Medicine) Mauro Kaufmann, RN as Oncology Nurse Navigator Rockwell Germany, RN as Oncology Nurse Navigator Fanny Skates, MD as Consulting Physician (General Surgery) Truitt Merle, MD as Consulting Physician (Hematology) Kyung Rudd, MD as Consulting Physician (Radiation Oncology)  Date of Service:  05/03/2019  CHIEF COMPLAINT: f/u of left breast cancer  SUMMARY OF ONCOLOGIC HISTORY: Oncology History Overview Note  Cancer Staging Malignant neoplasm of upper-inner quadrant of left breast in female, estrogen receptor positive ([place]) Staging form: Breast, AJCC 8th Edition - Clinical stage from 12/17/2018: Stage Unknown (cTX, cN0, cM0, G2, ER+, PR+, HER2-) - Signed by Truitt Merle, MD on 12/25/2018 - Pathologic stage from 01/08/2019: Stage IA (pT1c, pN0, cM0, G3, ER+, PR+, HER2-) - Signed by Truitt Merle, MD on 01/22/2019     Malignant neoplasm of upper-inner quadrant of left breast in female, estrogen receptor positive ([place])  11/28/2018 Mammogram   Diagnostic Mammogram 11/28/18 IMPRESSION:  Suspcious mass in upper inner quadrant of the left breast  at posterior depth.    12/17/2018 Initial Biopsy   Diagnosis 12/17/18 Breast, left, needle core biopsy, upper inner quadrant - INVASIVE DUCTAL CARCINOMA, SEE COMMENT. - DUCTAL CARCINOMA IN SITU.   12/17/2018 Cancer Staging   Staging form: Breast, AJCC 8th Edition - Clinical stage from 12/17/2018: Stage Unknown (cTX, cN0, cM0, G2, ER+, PR+, HER2-) - Signed by Truitt Merle, MD on 12/25/2018   12/20/2018 Initial Diagnosis   Malignant neoplasm of upper-inner quadrant of left breast in female, estrogen receptor positive ([place])   12/20/2018 Receptors her2   Results: IMMUNOHISTOCHEMICAL AND MORPHOMETRIC ANALYSIS PERFORMED MANUALLY The tumor cells are NEGATIVE for Her2 (1+). Estrogen Receptor: 95%,  POSITIVE, STRONG STAINING INTENSITY Progesterone Receptor: 30%, POSITIVE, STRONG STAINING INTENSITY Proliferation Marker Ki67: 20%   01/08/2019 Surgery   LEFT BREAST LUMPECTOMY WITH RADIOACTIVE SEED AND LEFT AXILLARY DEEP SENTINEL LYMPH NODE BIOPSY WITH BLUE DYE INJECTION by Dr [name] 01/08/19    01/08/2019 Pathology Results   Diagnosis 01/08/19 1. Breast, lumpectomy, left - INVASIVE DUCTAL CARCINOMA, GRADE III/III, SPANNING 1.8 CM. - DUCTAL CARCINOMA IN SITU, HIGH GRADE. - DUCTAL CARCINOMA IN SITU IS FOCALLY PRESENT AT THE ANTERIOR MARGIN OF SPECIMEN #1. - DUCTAL CARCINOMA IN SITU IS FOCALLY LESS THAN 0.1 CM FROM THE INFERIOR MARGIN OF SPECIMEN # 1. - SEE ONCOLOGY TABLE BELOW. 2. Breast, excision, left medial margin - DUCTAL CARCINOMA IN SITU, FOCALLY PRESENT AT THE MEDIAL MARGIN OF SPECIMEN # 2. 3. Breast, excision, left posterior margin - BENIGN BREAST PARENCHYMA. - THERE IS NO EVIDENCE OF MALIGNANCY. - SEE COMMENT. 4. Lymph node, sentinel, biopsy, left axillary - THERE IS NO EVIDENCE OF CARCINOMA IN 1 OF 1 LYMPH NODE (0/1). 5. Lymph node, sentinel, biopsy, left axillary - THERE IS NO EVIDENCE OF CARCINOMA IN 1 OF 1 LYMPH NODE (0/1). 6. Lymph node, sentinel, biopsy, left axillary - THERE IS NO EVIDENCE OF CARCINOMA IN 1 OF 1 LYMPH NODE (0/1)   01/08/2019 Oncotype testing   recurrence score of 27 with risk of recurrence 16% with Tamoxifen or AI alone. There is a benefit of chemotherapy >15%    01/08/2019 Cancer Staging   Staging form: Breast, AJCC 8th Edition - Pathologic stage from 01/08/2019: Stage IA (pT1c, pN0, cM0, G3, ER+, PR+, HER2-) - Signed by Truitt Merle, MD on 01/22/2019   01/29/2019 Genetic Testing  Negative genetic testing on the common hereditary cancer panel.  The Common Hereditary Gene Panel offered by [facility] includes sequencing and/or deletion duplication testing of the following 48 genes: APC, ATM, AXIN2, BARD1, BMPR1A, BRCA1, BRCA2, BRIP1, CDH1, CDK4, CDKN2A (p14ARF),  CDKN2A (p16INK4a), CHEK2, CTNNA1, DICER1, EPCAM (Deletion/duplication testing only), GREM1 (promoter region deletion/duplication testing only), KIT, MEN1, MLH1, MSH2, MSH3, MSH6, MUTYH, NBN, NF1, NHTL1, PALB2, PDGFRA, PMS2, POLD1, POLE, PTEN, RAD50, RAD51C, RAD51D, RNF43, SDHB, SDHC, SDHD, SMAD4, SMARCA4. STK11, TP53, TSC1, TSC2, and VHL.  The following genes were evaluated for sequence changes only: SDHA and HOXB13 c.251G>A variant only. The report date is January 29, 2019.   03/01/2019 Surgery   RE-EXCISION OF LEFT BREAST LUMPECTOMY MULITPLE MARGINS by Dr. [name]  Diagnosis 1. Breast, excision, re-excision left anterior margin - BENIGN BREAST PARENCHYMA WITH PREVIOUS PROCEDURE-RELATED CHANGES - NEGATIVE FOR IN SITU OR INVASIVE CARCINOMA 2. 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 SEE NOTE - DCIS IS FOCALLY LESS THAN 1 MM FROM NEW INFERIOR MARGIN - NEGATIVE FOR INVASIVE CARCINOMA - PREVIOUS PROCEDURE-RELATED CHANGES 3. Breast, excision, re-excision left medial margin - FOCAL DUCTAL CARCINOMA IN SITU (DCIS), 0.2 CM. SEE NOTE - DCIS IS 1.5 MM FROM NEW MEDIAL MARGIN - NEGATIVE FOR INVASIVE CARCINOMA - PREVIOUS PROCEDURE-RELATED CHANGES 4. Breast, excision, re-excision left final medial argin - BENIGN BREAST PARENCHYMA WITH MILD FOCAL PREVIOUS PROCEDURE-RELATED CHANGES - NEGATIVE FOR IN SITU OR INVASIVE CARCINOMA    03/22/2019 -  Chemotherapy   TC q3weeks for 4 cycles starting 03/22/19       CURRENT THERAPY:  Adjuvant chemoTCq3weeks for 4 cyclesstarting7/31/20  INTERVAL HISTORY:  Emma Casey is here for a follow up and treatment. She presents to the clinic alone. She notes having leg pain. Her pain level will fluctuate and can get up to 8/10. She tries to walk to alleviate her pain, but when significant will lay down. She has been on muscle relaxant since prior broken wrist. She still takes it as needed for her legs. She denies  neuropathy. She notes she did take dexa yesterday. She notes no nausea after chemo. She feels her appetite is improving. She will eat adequately for 3-4 days but makes sure she eats daily. She does not check her BG often. She notes she has significantly reduced smoking.     REVIEW OF SYSTEMS:   Constitutional: Denies fevers, chills or abnormal weight loss (+) improving appetite (+) weight gained Eyes: Denies blurriness of vision Ears, nose, mouth, throat, and face: Denies mucositis or sore throat Respiratory: Denies cough, dyspnea or wheezes Cardiovascular: Denies palpitation, chest discomfort or lower extremity swelling Gastrointestinal:  Denies nausea, heartburn or change in bowel habits Skin: Denies abnormal skin rashes MSK: (+) b/l leg pain Lymphatics: Denies new lymphadenopathy or easy bruising Neurological:Denies numbness, tingling or new weaknesses Behavioral/Psych: Mood is stable, no new changes  All other systems were reviewed with the patient and are negative.  MEDICAL HISTORY:  Past Medical History:  Diagnosis Date  . Anxiety   . Arthritis    right hand  . Bronchitis   . Cancer ([place]) 11/2018   left breast ca  . Depression   . Diabetes mellitus without complication ([place])   . Family history of breast cancer   . GERD (gastroesophageal reflux disease)   . Headache   . Heart murmur   . Herpes 1&2   . Hypertension   . Leg fracture, left    as a child  .  OSA on CPAP    has CPAP but has not used in over a month    SURGICAL HISTORY: Past Surgical History:  Procedure Laterality Date  . ABDOMINAL HYSTERECTOMY    . BREAST LUMPECTOMY WITH RADIOACTIVE SEED AND SENTINEL LYMPH NODE BIOPSY Left 01/08/2019   Procedure: LEFT BREAST LUMPECTOMY WITH RADIOACTIVE SEED AND LEFT AXILLARY DEEP SENTINEL LYMPH NODE BIOPSY WITH BLUE DYE INJECTION;  Surgeon: Fanny Skates, MD;  Location: [facility];  Service: General;  Laterality: Left;  . COLONOSCOPY N/A 09/02/2015    Procedure: COLONOSCOPY;  Surgeon: Danie Binder, MD;  [facility];  Service: Endoscopy;  Laterality: N/A;  11:15 Am  . EYE SURGERY Right    cataract  . HAND SURGERY Right    broken bone  . PORTACATH PLACEMENT N/A 03/01/2019   Procedure: INSERTION PORT-A-CATH;  Surgeon: Fanny Skates, MD;  Location: [facility];  Service: General;  Laterality: N/A;  . RE-EXCISION OF BREAST LUMPECTOMY Left 03/01/2019   Procedure: RE-EXCISION OF LEFT BREAST LUMPECTOMY MULITPLE MARGINS;  Surgeon: Fanny Skates, MD;  [facility];  Service: General;  Laterality: Left;  . TUBAL LIGATION      I have reviewed the social history and family history with the patient and they are unchanged from previous note.  ALLERGIES:  is allergic to asa [aspirin]; nsaids; and penicillins.  MEDICATIONS:  Current Outpatient Medications  Medication Sig Dispense Refill  . acyclovir (ZOVIRAX) 400 MG tablet Take 400 mg by mouth 2 (two) times daily.    Marland Kitchen amLODipine (NORVASC) 5 MG tablet Take 5 mg by mouth daily.    . Ascorbic Acid (VITAMIN C) 1000 MG tablet Take 1,000 mg by mouth daily.    Marland Kitchen atorvastatin (LIPITOR) 20 MG tablet Take 20 mg by mouth daily.    . Cholecalciferol (VITAMIN D) 50 MCG (2000 UT) CAPS Take 2,000 Units by mouth daily.    Marland Kitchen dexamethasone (DECADRON) 4 MG tablet Take 1 tablet (4 mg total) by mouth 2 (two) times daily. Start the day before Taxotere. Then 1 tab daily in the day after chemo for 3 days. 30 tablet 0  . fluticasone (FLONASE) 50 MCG/ACT nasal spray Place 2 sprays into both nostrils daily.     . folic acid (FOLVITE) 235 MCG tablet Take 400 mcg by mouth daily.     Marland Kitchen HYDROcodone-acetaminophen (NORCO) 5-325 MG tablet Take 1-2 tablets by mouth every 6 (six) hours as needed. 15 tablet 0  . lactulose (CHRONULAC) 10 GM/15ML solution Take 15 mLs (10 g total) by mouth 2 (two) times daily. 236 mL 0  . lidocaine-prilocaine (EMLA) cream Apply to affected area once 30 g 3  . lisinopril (ZESTRIL) 2.5 MG  tablet Take 2.5 mg by mouth at bedtime.    Marland Kitchen loratadine (CLARITIN) 10 MG tablet Take 10 mg by mouth daily.    . metFORMIN (GLUMETZA) 500 MG (MOD) 24 hr tablet Take 1,500 mg by mouth daily with breakfast.     . montelukast (SINGULAIR) 10 MG tablet Take 10 mg by mouth at bedtime.    Marland Kitchen omeprazole (PRILOSEC) 20 MG capsule Take 20 mg by mouth daily.    . ondansetron (ZOFRAN) 8 MG tablet Take 1 tablet (8 mg total) by mouth 2 (two) times daily as needed for refractory nausea / vomiting. Start on day 3 after chemo. 30 tablet 1  . PARoxetine (PAXIL) 10 MG tablet Take 10 mg by mouth at bedtime.    . potassium chloride (MICRO-K) 10 MEQ CR  capsule Take 10 mEq by mouth 2 (two) times daily.    . prochlorperazine (COMPAZINE) 10 MG tablet Take 1 tablet (10 mg total) by mouth every 6 (six) hours as needed (Nausea or vomiting). 30 tablet 1  . tiZANidine (ZANAFLEX) 4 MG capsule Take 4 mg by mouth every 8 (eight) hours.     . topiramate (TOPAMAX) 100 MG tablet Take 100 mg by mouth daily.    . vitamin B-12 (CYANOCOBALAMIN) 500 MCG tablet Take 500 mcg by mouth daily.      No current facility-administered medications for this visit.     PHYSICAL EXAMINATION: ECOG PERFORMANCE STATUS: 1 - Symptomatic but completely ambulatory  Vitals:   05/03/19 1056  BP: 135/68  Pulse: 97  Resp: 18  Temp: 98.5 F (36.9 C)  SpO2: 100%   Filed Weights   05/03/19 1056  Weight: 264 lb 9.6 oz (120 kg)    GENERAL:alert, no distress and comfortable SKIN: skin color, texture, turgor are normal, no rashes or significant lesions EYES: normal, Conjunctiva are pink and non-injected, sclera clear  NECK: supple, thyroid normal size, non-tender, without nodularity LYMPH:  no palpable lymphadenopathy in the cervical, axillary  LUNGS: clear to auscultation and percussion with normal breathing effort HEART: regular rate & rhythm and no murmurs and no lower extremity edema ABDOMEN:abdomen soft, non-tender and normal bowel sounds  Musculoskeletal:no cyanosis of digits and no clubbing  NEURO: alert & oriented x 3 with fluent speech, no focal motor/sensory deficits  LABORATORY DATA:  I have reviewed the data as listed CBC Latest Ref Rng & Units 05/03/2019 04/12/2019 03/28/2019  WBC 4.0 - 10.5 K/uL 9.3 10.8(H) 5.1  Hemoglobin 12.0 - 15.0 g/dL 11.3(L) 12.6 11.5(L)  Hematocrit 36.0 - 46.0 % 37.2 40.7 37.2  Platelets 150 - 400 K/uL 272 364 240     CMP Latest Ref Rng & Units 05/03/2019 04/12/2019 03/28/2019  Glucose 70 - 99 mg/dL 221(H) 279(H) 373(H)  BUN 6 - 20 mg/dL _0 Creatinine 0.44 - 1.00 mg/dL 1.25(H) 1.23(H) 1.48(H)  Sodium 135 - 145 mmol/L 139 140 136  Potassium 3.5 - 5.1 mmol/L 3.9 4.0 4.2  Chloride 98 - 111 mmol/L 108 106 102  CO2 22 - 32 mmol/L _1 Calcium 8.9 - 10.3 mg/dL 8.9 9.2 9.5  Total Protein 6.5 - 8.1 g/dL 5.8(L) 6.4(L) 6.6  Total Bilirubin 0.3 - 1.2 mg/dL 0.8 0.7 0.7  Alkaline Phos 38 - 126 U/L 64 82 76  AST 15 - 41 U/L 11(L) 8(L) 7(L)  ALT 0 - 44 U/L _2 RADIOGRAPHIC STUDIES: I have personally reviewed the radiological images as listed and agreed with the findings in the report. No results found.   ASSESSMENT & PLAN:  Emma Casey is a 58 y.o. female with   1.Malignant neoplasm of upper-inner quadrant of left breast,pT1cN0,stage I,ER/PR+,HER2-, GradeIII, Oncotype RS 27 -She was diagnosed in 11/2018.  -She underwentleftbreastlumpectomyand SLN biopsyon 01/08/19. We discussedher pathology report which shows 1.8 cmIDC with DCIS,grade III,all 3nodesnegative. Her surgical margins were positive for DCIS. -Given positive margins she underwent left re-excision on 03/01/19.Pathology with negative margins. She is recovering well with left breast shooting pain. I discussed this will improve over time but may not completely resolve.  -Based on her RS 27, she will benefit from adjuvant chemo to reduce her risk of recurrence followed by RT and anti-estrogen therapy. -I  started her on moderately intensive adjuvant chemo withdocetaxel and cytoxan (TC)q3weeks for  4 cycleson 03/22/19. She is tolerating moderately well with fatigue, leg pain and loss of appetite. She has been able to maintain weight.  -Labs reviewed today, CBC and CMP WNL except Hg 11.3, BG 221, Cr 1.25. Overall adequate to proceed with C3 TC today, same dose.  -Will refer her back to Rad onc after last cycle chemo.  -F/u in 3 weeks for last cycle chemo. I strongly encouraged her to contact clinic or come in if she develops significant or unexpected side effects.    2. HTN, DMwith hyperglycemia -On Amlodipine, lisinopril, metformin -HTN controlled -Will watch closely during chemo and pre-med dexa can effects her BG and BP.I also encouraged her to monitor at home. -she has poorly controlled hyperglycemia. I instructed her to reduce her sugar intake week of chemo. I have already reduced her dexa pre-med dose.I also encouraged her to check her BG daily.   3.CKD stage III -secondary to DM and HTN.  -I encouraged her to drink plenty of water and to avoid NSAID's -Will monitor   4. Smoking Cessation  -She smoked for 10 years before she quit. She restarted after recent cancer diagnosis  -I strongly encouraged her to quit smoking completely and continue cessation. She understands and agrees. -She states she has significantly reduced smoking. I encouraged her to continue until she quits completely.   5. Genetic Testingwasnegativefor pathogenetic mutations -Patient recently found out other than her maternal aunt, 3 of her maternal first cousins have breast cancer as well.   6. B/l leg pain -Likely secondary to chemo, no significant neuropathy of her fingers and toes -Improves with movement. I encouraged her to continue to exercise her legs even when sitting.  -She has been using her remaining muscle relaxant as needed. OK to continue    PLAN: -Lab reviewed and adequate to  proceed with C3 TC today  -Lab, flush, f/u and TC in 3 weeks    No problem-specific Assessment & Plan notes found for this encounter.   No orders of the defined types were placed in this encounter.  All questions were answered. The patient knows to call the clinic with any problems, questions or concerns. No barriers to learning was detected. I spent 20 minutes counseling the patient face to face. The total time spent in the appointment was 25 minutes and more than 50% was on counseling and review of test results     Truitt Merle, MD 05/03/2019   I, Joslyn Devon, am acting as scribe for Truitt Merle, MD.   I have reviewed the above documentation for accuracy and completeness, and I agree with the above.

## 2019-05-03 ENCOUNTER — Encounter: Payer: Self-pay | Admitting: Hematology

## 2019-05-03 ENCOUNTER — Inpatient Hospital Stay: Payer: Medicare Other

## 2019-05-03 ENCOUNTER — Telehealth: Payer: Self-pay | Admitting: Hematology

## 2019-05-03 ENCOUNTER — Inpatient Hospital Stay: Payer: Medicare Other | Attending: Hematology | Admitting: Hematology

## 2019-05-03 ENCOUNTER — Other Ambulatory Visit: Payer: Self-pay

## 2019-05-03 VITALS — BP 135/68 | HR 97 | Temp 98.5°F | Resp 18 | Ht 66.0 in | Wt 264.6 lb

## 2019-05-03 DIAGNOSIS — Z79899 Other long term (current) drug therapy: Secondary | ICD-10-CM | POA: Diagnosis not present

## 2019-05-03 DIAGNOSIS — Z90710 Acquired absence of both cervix and uterus: Secondary | ICD-10-CM | POA: Insufficient documentation

## 2019-05-03 DIAGNOSIS — E119 Type 2 diabetes mellitus without complications: Secondary | ICD-10-CM | POA: Diagnosis not present

## 2019-05-03 DIAGNOSIS — C50212 Malignant neoplasm of upper-inner quadrant of left female breast: Secondary | ICD-10-CM | POA: Insufficient documentation

## 2019-05-03 DIAGNOSIS — Z7984 Long term (current) use of oral hypoglycemic drugs: Secondary | ICD-10-CM | POA: Diagnosis not present

## 2019-05-03 DIAGNOSIS — F419 Anxiety disorder, unspecified: Secondary | ICD-10-CM | POA: Diagnosis not present

## 2019-05-03 DIAGNOSIS — Z170 Estrogen receptor positive status [ER+]: Secondary | ICD-10-CM

## 2019-05-03 DIAGNOSIS — Z95828 Presence of other vascular implants and grafts: Secondary | ICD-10-CM

## 2019-05-03 DIAGNOSIS — F329 Major depressive disorder, single episode, unspecified: Secondary | ICD-10-CM | POA: Insufficient documentation

## 2019-05-03 DIAGNOSIS — Z9221 Personal history of antineoplastic chemotherapy: Secondary | ICD-10-CM | POA: Insufficient documentation

## 2019-05-03 DIAGNOSIS — Z5111 Encounter for antineoplastic chemotherapy: Secondary | ICD-10-CM | POA: Insufficient documentation

## 2019-05-03 DIAGNOSIS — Z803 Family history of malignant neoplasm of breast: Secondary | ICD-10-CM | POA: Diagnosis not present

## 2019-05-03 DIAGNOSIS — I10 Essential (primary) hypertension: Secondary | ICD-10-CM | POA: Diagnosis not present

## 2019-05-03 DIAGNOSIS — Z5189 Encounter for other specified aftercare: Secondary | ICD-10-CM | POA: Insufficient documentation

## 2019-05-03 DIAGNOSIS — N183 Chronic kidney disease, stage 3 (moderate): Secondary | ICD-10-CM | POA: Insufficient documentation

## 2019-05-03 DIAGNOSIS — M1990 Unspecified osteoarthritis, unspecified site: Secondary | ICD-10-CM | POA: Insufficient documentation

## 2019-05-03 DIAGNOSIS — R739 Hyperglycemia, unspecified: Secondary | ICD-10-CM

## 2019-05-03 LAB — CBC WITH DIFFERENTIAL (CANCER CENTER ONLY)
Abs Immature Granulocytes: 0.03 10*3/uL (ref 0.00–0.07)
Basophils Absolute: 0.1 10*3/uL (ref 0.0–0.1)
Basophils Relative: 1 %
Eosinophils Absolute: 0 10*3/uL (ref 0.0–0.5)
Eosinophils Relative: 0 %
HCT: 37.2 % (ref 36.0–46.0)
Hemoglobin: 11.3 g/dL — ABNORMAL LOW (ref 12.0–15.0)
Immature Granulocytes: 0 %
Lymphocytes Relative: 23 %
Lymphs Abs: 2.2 10*3/uL (ref 0.7–4.0)
MCH: 29 pg (ref 26.0–34.0)
MCHC: 30.4 g/dL (ref 30.0–36.0)
MCV: 95.6 fL (ref 80.0–100.0)
Monocytes Absolute: 0.7 10*3/uL (ref 0.1–1.0)
Monocytes Relative: 7 %
Neutro Abs: 6.3 10*3/uL (ref 1.7–7.7)
Neutrophils Relative %: 69 %
Platelet Count: 272 10*3/uL (ref 150–400)
RBC: 3.89 MIL/uL (ref 3.87–5.11)
RDW: 17.6 % — ABNORMAL HIGH (ref 11.5–15.5)
WBC Count: 9.3 10*3/uL (ref 4.0–10.5)
nRBC: 0 % (ref 0.0–0.2)

## 2019-05-03 LAB — CMP (CANCER CENTER ONLY)
ALT: 9 U/L (ref 0–44)
AST: 11 U/L — ABNORMAL LOW (ref 15–41)
Albumin: 3.5 g/dL (ref 3.5–5.0)
Alkaline Phosphatase: 64 U/L (ref 38–126)
Anion gap: 7 (ref 5–15)
BUN: 13 mg/dL (ref 6–20)
CO2: 24 mmol/L (ref 22–32)
Calcium: 8.9 mg/dL (ref 8.9–10.3)
Chloride: 108 mmol/L (ref 98–111)
Creatinine: 1.25 mg/dL — ABNORMAL HIGH (ref 0.44–1.00)
GFR, Est AFR Am: 55 mL/min — ABNORMAL LOW (ref >60)
GFR, Estimated: 47 mL/min — ABNORMAL LOW (ref >60)
Glucose, Bld: 221 mg/dL — ABNORMAL HIGH (ref 70–99)
Potassium: 3.9 mmol/L (ref 3.5–5.1)
Sodium: 139 mmol/L (ref 135–145)
Total Bilirubin: 0.8 mg/dL (ref 0.3–1.2)
Total Protein: 5.8 g/dL — ABNORMAL LOW (ref 6.5–8.1)

## 2019-05-03 MED ORDER — SODIUM CHLORIDE 0.9% FLUSH
10.0000 mL | INTRAVENOUS | Status: DC | PRN
  Administered 2019-05-03: 10 mL
  Filled 2019-05-03: qty 10

## 2019-05-03 MED ORDER — PALONOSETRON HCL INJECTION 0.25 MG/5ML
INTRAVENOUS | Status: AC
  Filled 2019-05-03: qty 5

## 2019-05-03 MED ORDER — SODIUM CHLORIDE 0.9 % IV SOLN
600.0000 mg/m2 | Freq: Once | INTRAVENOUS | Status: AC
  Administered 2019-05-03: 15:00:00 1420 mg via INTRAVENOUS
  Filled 2019-05-03: qty 71

## 2019-05-03 MED ORDER — SODIUM CHLORIDE 0.9 % IV SOLN
Freq: Once | INTRAVENOUS | Status: AC
  Administered 2019-05-03: 13:00:00 via INTRAVENOUS
  Filled 2019-05-03: qty 250

## 2019-05-03 MED ORDER — DEXAMETHASONE SODIUM PHOSPHATE 10 MG/ML IJ SOLN
10.0000 mg | Freq: Once | INTRAMUSCULAR | Status: AC
  Administered 2019-05-03: 10 mg via INTRAVENOUS

## 2019-05-03 MED ORDER — HEPARIN SOD (PORK) LOCK FLUSH 100 UNIT/ML IV SOLN
500.0000 [IU] | Freq: Once | INTRAVENOUS | Status: AC | PRN
  Administered 2019-05-03: 500 [IU]
  Filled 2019-05-03: qty 5

## 2019-05-03 MED ORDER — PALONOSETRON HCL INJECTION 0.25 MG/5ML
0.2500 mg | Freq: Once | INTRAVENOUS | Status: AC
  Administered 2019-05-03: 0.25 mg via INTRAVENOUS

## 2019-05-03 MED ORDER — SODIUM CHLORIDE 0.9% FLUSH
10.0000 mL | Freq: Once | INTRAVENOUS | Status: AC
  Administered 2019-05-03: 10 mL
  Filled 2019-05-03: qty 10

## 2019-05-03 MED ORDER — DEXAMETHASONE SODIUM PHOSPHATE 10 MG/ML IJ SOLN
INTRAMUSCULAR | Status: AC
  Filled 2019-05-03: qty 1

## 2019-05-03 MED ORDER — SODIUM CHLORIDE 0.9 % IV SOLN
75.0000 mg/m2 | Freq: Once | INTRAVENOUS | Status: AC
  Administered 2019-05-03: 14:00:00 180 mg via INTRAVENOUS
  Filled 2019-05-03: qty 18

## 2019-05-03 NOTE — Patient Instructions (Signed)
Cancer Center Discharge Instructions for Patients Receiving Chemotherapy  Today you received the following chemotherapy agents Taxotere, Cytoxan  To help prevent nausea and vomiting after your treatment, we encourage you to take your nausea medication as directed by your MD.   If you develop nausea and vomiting that is not controlled by your nausea medication, call the clinic.   BELOW ARE SYMPTOMS THAT SHOULD BE REPORTED IMMEDIATELY:  *FEVER GREATER THAN 100.5 F  *CHILLS WITH OR WITHOUT FEVER  NAUSEA AND VOMITING THAT IS NOT CONTROLLED WITH YOUR NAUSEA MEDICATION  *UNUSUAL SHORTNESS OF BREATH  *UNUSUAL BRUISING OR BLEEDING  TENDERNESS IN MOUTH AND THROAT WITH OR WITHOUT PRESENCE OF ULCERS  *URINARY PROBLEMS  *BOWEL PROBLEMS  UNUSUAL RASH Items with * indicate a potential emergency and should be followed up as soon as possible.  Feel free to call the clinic should you have any questions or concerns. The clinic phone number is [phone].  Please show the CHEMO ALERT CARD at check-in to the Emergency Department and triage nurse.  Coronavirus (COVID-19) Are you at risk?  Are you at risk for the Coronavirus (COVID-19)?  To be considered HIGH RISK for Coronavirus (COVID-19), you have to meet the following criteria:  . Traveled to [place], [place], [place], [place] or [place]; or in the [place] to [place], [place], [place], or [place]; and have fever, cough, and shortness of breath within the last 2 weeks of travel OR . Been in close contact with a person diagnosed with COVID-19 within the last 2 weeks and have fever, cough, and shortness of breath . IF YOU DO NOT MEET THESE CRITERIA, YOU ARE CONSIDERED LOW RISK FOR COVID-19.  What to do if you are HIGH RISK for COVID-19?  . If you are having a medical emergency, call 911. . Seek medical care right away. Before you go to a doctor's office, urgent care or emergency department, call ahead and tell  them about your recent travel, contact with someone diagnosed with COVID-19, and your symptoms. You should receive instructions from your physician's office regarding next steps of care.  . When you arrive at healthcare provider, tell the healthcare staff immediately you have returned from visiting [place], [place], [place], [place] or [place]; or traveled in the [place] to [place], [place], [place], or [place]; in the last two weeks or you have been in close contact with a person diagnosed with COVID-19 in the last 2 weeks.   . Tell the health care staff about your symptoms: fever, cough and shortness of breath. . After you have been seen by a medical provider, you will be either: o Tested for (COVID-19) and discharged home on quarantine except to seek medical care if symptoms worsen, and asked to  - Stay home and avoid contact with others until you get your results (4-5 days)  - Avoid travel on public transportation if possible (such as bus, train, or airplane) or o Sent to the Emergency Department by EMS for evaluation, COVID-19 testing, and possible admission depending on your condition and test results.  What to do if you are LOW RISK for COVID-19?  Reduce your risk of any infection by using the same precautions used for avoiding the common cold or flu:  . Wash your hands often with soap and warm water for at least 20 seconds.  If soap and water are not readily available, use an alcohol-based hand sanitizer with at least 60% alcohol.  .   or sneezing, cover your mouth and nose by coughing or sneezing into the elbow areas of your shirt or coat, into a tissue or into your sleeve (not your hands). . Avoid shaking hands with others and consider head nods or verbal greetings only. . Avoid touching your eyes, nose, or mouth with unwashed hands.  . Avoid close contact with people who are sick. . Avoid places or events with large numbers of people in one location, like concerts or  sporting events. . Carefully consider travel plans you have or are making. . If you are planning any travel outside or inside the [place], visit the CDC's Travelers' Health webpage for the latest health notices. . If you have some symptoms but not all symptoms, continue to monitor at home and seek medical attention if your symptoms worsen. . If you are having a medical emergency, call 911.   [place] / e-Visit: [email or web address]         [facility]: [facility]: [phone]                   [facility]: [phone]

## 2019-05-03 NOTE — Telephone Encounter (Signed)
Added injection 9/14 per 9/11 schedule message. Confirmed with patient.

## 2019-05-04 LAB — HEMOGLOBIN A1C
Hgb A1c MFr Bld: 8.8 % — ABNORMAL HIGH (ref 4.8–5.6)
Mean Plasma Glucose: 206 mg/dL

## 2019-05-06 ENCOUNTER — Telehealth: Payer: Self-pay

## 2019-05-06 ENCOUNTER — Inpatient Hospital Stay: Payer: Medicare Other

## 2019-05-06 ENCOUNTER — Other Ambulatory Visit: Payer: Self-pay

## 2019-05-06 ENCOUNTER — Telehealth: Payer: Self-pay | Admitting: Hematology

## 2019-05-06 VITALS — BP 132/70 | HR 65 | Temp 98.7°F | Resp 18

## 2019-05-06 DIAGNOSIS — Z5111 Encounter for antineoplastic chemotherapy: Secondary | ICD-10-CM | POA: Diagnosis not present

## 2019-05-06 DIAGNOSIS — C50212 Malignant neoplasm of upper-inner quadrant of left female breast: Secondary | ICD-10-CM

## 2019-05-06 MED ORDER — PEGFILGRASTIM-CBQV 6 MG/0.6ML ~~LOC~~ SOSY
PREFILLED_SYRINGE | SUBCUTANEOUS | Status: AC
  Filled 2019-05-06: qty 0.6

## 2019-05-06 MED ORDER — PEGFILGRASTIM-CBQV 6 MG/0.6ML ~~LOC~~ SOSY
6.0000 mg | PREFILLED_SYRINGE | Freq: Once | SUBCUTANEOUS | Status: AC
  Administered 2019-05-06: 6 mg via SUBCUTANEOUS

## 2019-05-06 NOTE — Telephone Encounter (Signed)
No los per 9/11.

## 2019-05-06 NOTE — Patient Instructions (Signed)

## 2019-05-06 NOTE — Telephone Encounter (Signed)
Faxed lab results to Dr. Lorra Hals per Dr. Burr Medico to fax [phone], sent to HIM to scan.

## 2019-05-21 NOTE — Progress Notes (Signed)
[place]   Telephone:([phone]) [phone] [phone]   Clinic Follow up Note   Patient Care Team: Sandi Mealy, MD as PCP - General (Family Medicine) Mauro Kaufmann, RN as Oncology Nurse Navigator Rockwell Germany, RN as Oncology Nurse Navigator Fanny Skates, MD as Consulting Physician (General Surgery) Truitt Merle, MD as Consulting Physician (Hematology) Kyung Rudd, MD as Consulting Physician (Radiation Oncology)  Date of Service:  05/24/2019  CHIEF COMPLAINT: f/u of left breast cancer  SUMMARY OF ONCOLOGIC HISTORY: Oncology History Overview Note  Cancer Staging Malignant neoplasm of upper-inner quadrant of left breast in female, estrogen receptor positive ([place]) Staging form: Breast, AJCC 8th Edition - Clinical stage from 12/17/2018: Stage Unknown (cTX, cN0, cM0, G2, ER+, PR+, HER2-) - Signed by Truitt Merle, MD on 12/25/2018 - Pathologic stage from 01/08/2019: Stage IA (pT1c, pN0, cM0, G3, ER+, PR+, HER2-) - Signed by Truitt Merle, MD on 01/22/2019     Malignant neoplasm of upper-inner quadrant of left breast in female, estrogen receptor positive ([place])  11/28/2018 Mammogram   Diagnostic Mammogram 11/28/18 IMPRESSION:  Suspcious mass in upper inner quadrant of the left breast  at posterior depth.    12/17/2018 Initial Biopsy   Diagnosis 12/17/18 Breast, left, needle core biopsy, upper inner quadrant - INVASIVE DUCTAL CARCINOMA, SEE COMMENT. - DUCTAL CARCINOMA IN SITU.   12/17/2018 Cancer Staging   Staging form: Breast, AJCC 8th Edition - Clinical stage from 12/17/2018: Stage Unknown (cTX, cN0, cM0, G2, ER+, PR+, HER2-) - Signed by Truitt Merle, MD on 12/25/2018   12/20/2018 Initial Diagnosis   Malignant neoplasm of upper-inner quadrant of left breast in female, estrogen receptor positive ([place])   12/20/2018 Receptors her2   Results: IMMUNOHISTOCHEMICAL AND MORPHOMETRIC ANALYSIS PERFORMED MANUALLY The tumor cells are NEGATIVE for Her2 (1+). Estrogen Receptor: 95%,  POSITIVE, STRONG STAINING INTENSITY Progesterone Receptor: 30%, POSITIVE, STRONG STAINING INTENSITY Proliferation Marker Ki67: 20%   01/08/2019 Surgery   LEFT BREAST LUMPECTOMY WITH RADIOACTIVE SEED AND LEFT AXILLARY DEEP SENTINEL LYMPH NODE BIOPSY WITH BLUE DYE INJECTION by Dr [name] 01/08/19    01/08/2019 Pathology Results   Diagnosis 01/08/19 1. Breast, lumpectomy, left - INVASIVE DUCTAL CARCINOMA, GRADE III/III, SPANNING 1.8 CM. - DUCTAL CARCINOMA IN SITU, HIGH GRADE. - DUCTAL CARCINOMA IN SITU IS FOCALLY PRESENT AT THE ANTERIOR MARGIN OF SPECIMEN #1. - DUCTAL CARCINOMA IN SITU IS FOCALLY LESS THAN 0.1 CM FROM THE INFERIOR MARGIN OF SPECIMEN # 1. - SEE ONCOLOGY TABLE BELOW. 2. Breast, excision, left medial margin - DUCTAL CARCINOMA IN SITU, FOCALLY PRESENT AT THE MEDIAL MARGIN OF SPECIMEN # 2. 3. Breast, excision, left posterior margin - BENIGN BREAST PARENCHYMA. - THERE IS NO EVIDENCE OF MALIGNANCY. - SEE COMMENT. 4. Lymph node, sentinel, biopsy, left axillary - THERE IS NO EVIDENCE OF CARCINOMA IN 1 OF 1 LYMPH NODE (0/1). 5. Lymph node, sentinel, biopsy, left axillary - THERE IS NO EVIDENCE OF CARCINOMA IN 1 OF 1 LYMPH NODE (0/1). 6. Lymph node, sentinel, biopsy, left axillary - THERE IS NO EVIDENCE OF CARCINOMA IN 1 OF 1 LYMPH NODE (0/1)   01/08/2019 Oncotype testing   recurrence score of 27 with risk of recurrence 16% with Tamoxifen or AI alone. There is a benefit of chemotherapy >15%    01/08/2019 Cancer Staging   Staging form: Breast, AJCC 8th Edition - Pathologic stage from 01/08/2019: Stage IA (pT1c, pN0, cM0, G3, ER+, PR+, HER2-) - Signed by Truitt Merle, MD on 01/22/2019   01/29/2019 Genetic Testing  Negative genetic testing on the common hereditary cancer panel.  The Common Hereditary Gene Panel offered by [facility] includes sequencing and/or deletion duplication testing of the following 48 genes: APC, ATM, AXIN2, BARD1, BMPR1A, BRCA1, BRCA2, BRIP1, CDH1, CDK4, CDKN2A (p14ARF),  CDKN2A (p16INK4a), CHEK2, CTNNA1, DICER1, EPCAM (Deletion/duplication testing only), GREM1 (promoter region deletion/duplication testing only), KIT, MEN1, MLH1, MSH2, MSH3, MSH6, MUTYH, NBN, NF1, NHTL1, PALB2, PDGFRA, PMS2, POLD1, POLE, PTEN, RAD50, RAD51C, RAD51D, RNF43, SDHB, SDHC, SDHD, SMAD4, SMARCA4. STK11, TP53, TSC1, TSC2, and VHL.  The following genes were evaluated for sequence changes only: SDHA and HOXB13 c.251G>A variant only. The report date is January 29, 2019.   03/01/2019 Surgery   RE-EXCISION OF LEFT BREAST LUMPECTOMY MULITPLE MARGINS by Dr. [name]  Diagnosis 1. Breast, excision, re-excision left anterior margin - BENIGN BREAST PARENCHYMA WITH PREVIOUS PROCEDURE-RELATED CHANGES - NEGATIVE FOR IN SITU OR INVASIVE CARCINOMA 2. 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 SEE NOTE - DCIS IS FOCALLY LESS THAN 1 MM FROM NEW INFERIOR MARGIN - NEGATIVE FOR INVASIVE CARCINOMA - PREVIOUS PROCEDURE-RELATED CHANGES 3. Breast, excision, re-excision left medial margin - FOCAL DUCTAL CARCINOMA IN SITU (DCIS), 0.2 CM. SEE NOTE - DCIS IS 1.5 MM FROM NEW MEDIAL MARGIN - NEGATIVE FOR INVASIVE CARCINOMA - PREVIOUS PROCEDURE-RELATED CHANGES 4. Breast, excision, re-excision left final medial argin - BENIGN BREAST PARENCHYMA WITH MILD FOCAL PREVIOUS PROCEDURE-RELATED CHANGES - NEGATIVE FOR IN SITU OR INVASIVE CARCINOMA    03/22/2019 - 05/24/2019 Chemotherapy   TC q3weeks for 4 cycles starting 03/22/19 and completed on 05/24/19       CURRENT THERAPY:  Adjuvant chemoTCq3weeks for 4 cycles7/31/20-10/2/20  INTERVAL HISTORY:  Emma Casey is here for a follow up and treatment. She presents to the clinic alone. She notes her latest cycle chemo was manageable. It took longer for her to recover from her injection due to bone pain. She felt she could not do anything for 2 weeks.   REVIEW OF SYSTEMS:   Constitutional: Denies fevers, chills or  abnormal weight loss Eyes: Denies blurriness of vision Ears, nose, mouth, throat, and face: Denies mucositis or sore throat Respiratory: Denies cough, dyspnea or wheezes Cardiovascular: Denies palpitation, chest discomfort or lower extremity swelling Gastrointestinal:  Denies nausea, heartburn or change in bowel habits Skin: Denies abnormal skin rashes Lymphatics: Denies new lymphadenopathy or easy bruising Neurological:Denies numbness, tingling or new weaknesses Behavioral/Psych: Mood is stable, no new changes  All other systems were reviewed with the patient and are negative.  MEDICAL HISTORY:  Past Medical History:  Diagnosis Date  . Anxiety   . Arthritis    right hand  . Bronchitis   . Cancer ([place]) 11/2018   left breast ca  . Depression   . Diabetes mellitus without complication ([place])   . Family history of breast cancer   . GERD (gastroesophageal reflux disease)   . Headache   . Heart murmur   . Herpes 1&2   . Hypertension   . Leg fracture, left    as a child  . OSA on CPAP    has CPAP but has not used in over a month    SURGICAL HISTORY: Past Surgical History:  Procedure Laterality Date  . ABDOMINAL HYSTERECTOMY    . BREAST LUMPECTOMY WITH RADIOACTIVE SEED AND SENTINEL LYMPH NODE BIOPSY Left 01/08/2019   Procedure: LEFT BREAST LUMPECTOMY WITH RADIOACTIVE SEED AND LEFT AXILLARY DEEP SENTINEL LYMPH NODE BIOPSY WITH BLUE DYE INJECTION;  Surgeon: Fanny Skates,  MD;  Location: [place];  Service: General;  Laterality: Left;  . COLONOSCOPY N/A 09/02/2015   Procedure: COLONOSCOPY;  Surgeon: Danie Binder, MD;  [facility];  Service: Endoscopy;  Laterality: N/A;  11:15 Am  . EYE SURGERY Right    cataract  . HAND SURGERY Right    broken bone  . PORTACATH PLACEMENT N/A 03/01/2019   Procedure: INSERTION PORT-A-CATH;  Surgeon: Fanny Skates, MD;  Location: [facility];  Service: General;  Laterality: N/A;  . RE-EXCISION OF BREAST LUMPECTOMY Left  03/01/2019   Procedure: RE-EXCISION OF LEFT BREAST LUMPECTOMY MULITPLE MARGINS;  Surgeon: Fanny Skates, MD;  Location: [facility];  Service: General;  Laterality: Left;  . TUBAL LIGATION      I have reviewed the social history and family history with the patient and they are unchanged from previous note.  ALLERGIES:  is allergic to asa [aspirin]; nsaids; and penicillins.  MEDICATIONS:  Current Outpatient Medications  Medication Sig Dispense Refill  . acyclovir (ZOVIRAX) 400 MG tablet Take 400 mg by mouth 2 (two) times daily.    Marland Kitchen amLODipine (NORVASC) 5 MG tablet Take 5 mg by mouth daily.    . Ascorbic Acid (VITAMIN C) 1000 MG tablet Take 1,000 mg by mouth daily.    Marland Kitchen atorvastatin (LIPITOR) 20 MG tablet Take 20 mg by mouth daily.    . Cholecalciferol (VITAMIN D) 50 MCG (2000 UT) CAPS Take 2,000 Units by mouth daily.    Marland Kitchen dexamethasone (DECADRON) 4 MG tablet Take 1 tablet (4 mg total) by mouth 2 (two) times daily. Start the day before Taxotere. Then 1 tab daily in the day after chemo for 3 days. 30 tablet 0  . fluticasone (FLONASE) 50 MCG/ACT nasal spray Place 2 sprays into both nostrils daily.     . folic acid (FOLVITE) 876 MCG tablet Take 400 mcg by mouth daily.     Marland Kitchen HYDROcodone-acetaminophen (NORCO) 5-325 MG tablet Take 1-2 tablets by mouth every 6 (six) hours as needed. 15 tablet 0  . lactulose (CHRONULAC) 10 GM/15ML solution Take 15 mLs (10 g total) by mouth 2 (two) times daily. 236 mL 0  . lidocaine-prilocaine (EMLA) cream Apply to affected area once 30 g 3  . lisinopril (ZESTRIL) 2.5 MG tablet Take 2.5 mg by mouth at bedtime.    Marland Kitchen loratadine (CLARITIN) 10 MG tablet Take 10 mg by mouth daily.    . metFORMIN (GLUMETZA) 500 MG (MOD) 24 hr tablet Take 1,500 mg by mouth daily with breakfast.     . montelukast (SINGULAIR) 10 MG tablet Take 10 mg by mouth at bedtime.    Marland Kitchen omeprazole (PRILOSEC) 20 MG capsule Take 20 mg by mouth daily.    . ondansetron (ZOFRAN) 8 MG tablet Take 1 tablet (8 mg  total) by mouth 2 (two) times daily as needed for refractory nausea / vomiting. Start on day 3 after chemo. 30 tablet 1  . PARoxetine (PAXIL) 10 MG tablet Take 10 mg by mouth at bedtime.    . potassium chloride (MICRO-K) 10 MEQ CR capsule Take 10 mEq by mouth 2 (two) times daily.    . prochlorperazine (COMPAZINE) 10 MG tablet Take 1 tablet (10 mg total) by mouth every 6 (six) hours as needed (Nausea or vomiting). 30 tablet 1  . tiZANidine (ZANAFLEX) 4 MG capsule Take 4 mg by mouth every 8 (eight) hours.     . topiramate (TOPAMAX) 100 MG tablet Take 100 mg by mouth daily.    Marland Kitchen  vitamin B-12 (CYANOCOBALAMIN) 500 MCG tablet Take 500 mcg by mouth daily.      No current facility-administered medications for this visit.     PHYSICAL EXAMINATION: ECOG PERFORMANCE STATUS: 1 - Symptomatic but completely ambulatory  Vitals:   05/24/19 1003  BP: (!) 141/66  Pulse: 88  Resp: 18  Temp: 98.5 F (36.9 C)  SpO2: 100%   Filed Weights   05/24/19 1003  Weight: 267 lb 4.8 oz (121.2 kg)    GENERAL:alert, no distress and comfortable SKIN: skin color, texture, turgor are normal, no rashes or significant lesions EYES: normal, Conjunctiva are pink and non-injected, sclera clear  NECK: supple, thyroid normal size, non-tender, without nodularity LYMPH:  no palpable lymphadenopathy in the cervical, axillary  LUNGS: clear to auscultation and percussion with normal breathing effort HEART: regular rate & rhythm and no murmurs and no lower extremity edema ABDOMEN:abdomen soft, non-tender and normal bowel sounds Musculoskeletal:no cyanosis of digits and no clubbing  NEURO: alert & oriented x 3 with fluent speech, no focal motor/sensory deficits BREAST: S/p left breast lumpectomy and re-excision: surgical incision healed well (+) Skin hyperpigmentation and moister in b/l breast folds. No palpable mass, nodules or adenopathy bilaterally. Breast exam benign.   LABORATORY DATA:  I have reviewed the data as listed  CBC Latest Ref Rng & Units 05/24/2019 05/03/2019 04/12/2019  WBC 4.0 - 10.5 K/uL 8.1 9.3 10.8(H)  Hemoglobin 12.0 - 15.0 g/dL 10.6(L) 11.3(L) 12.6  Hematocrit 36.0 - 46.0 % 33.8(L) 37.2 40.7  Platelets 150 - 400 K/uL 361 272 364     CMP Latest Ref Rng & Units 05/24/2019 05/03/2019 04/12/2019  Glucose 70 - 99 mg/dL 183(H) 221(H) 279(H)  BUN 6 - 20 mg/dL 11 13 13   Creatinine 0.44 - 1.00 mg/dL 1.05(H) 1.25(H) 1.23(H)  Sodium 135 - 145 mmol/L 139 139 140  Potassium 3.5 - 5.1 mmol/L 3.6 3.9 4.0  Chloride 98 - 111 mmol/L 107 108 106  CO2 22 - 32 mmol/L 26 24 27   Calcium 8.9 - 10.3 mg/dL 8.7(L) 8.9 9.2  Total Protein 6.5 - 8.1 g/dL 5.4(L) 5.8(L) 6.4(L)  Total Bilirubin 0.3 - 1.2 mg/dL 0.8 0.8 0.7  Alkaline Phos 38 - 126 U/L 55 64 82  AST 15 - 41 U/L 9(L) 11(L) 8(L)  ALT 0 - 44 U/L 8 9 10       RADIOGRAPHIC STUDIES: I have personally reviewed the radiological images as listed and agreed with the findings in the report. No results found.   ASSESSMENT & PLAN:  Emma Casey is a 58 y.o. female with   1.Malignant neoplasm of upper-inner quadrant of left breast,pT1cN0,stage I,ER/PR+,HER2-, GradeIII, Oncotype RS 27 -She was diagnosed in 11/2018. She underwentleftbreastlumpectomyand SLN biopsyon 01/08/19. We discussedher pathology report which shows 1.8 cmIDC with DCIS,grade III,all 3nodesnegative. Her surgical margins were positive for DCIS. -Given positive margins she underwent left re-excision on 03/01/19.Pathology with negative margins. She is recovering well with left breast shooting pain. I discussed this will improve over time but may not completely resolve.  -Based on her RS 27, she will benefit from adjuvant chemo to reduce her risk of recurrence followed by RT and anti-estrogen therapy. -I started her onmoderately intensive adjuvant chemo withdocetaxel and cytoxan (TC)q3weeks for 4 cycleson 03/22/19. She is tolerating moderately well with fatigue, leg pain and loss of  appetite. She has been able to maintain weight.  -Labs reviewed, CBC and CMP WNL except Hg 10.6, Cr 1.05, overall adequate to proceed with last cycle TC today.  -  Given it took her 2 weeks to recover from bone pain from her Udenyca injection, will forgo last injection this cycle.  -Will proceed with Radiation soon with Dr Lisbeth Renshaw, after she recovers from chemo. Plan to proceed with antiestrogen therapy after radiaiton.  -She opted to keep PAC in place for now given her veins roll. I discussed this would require port flushes every 2 months. She understands.  -f/u 2 months  -She opted to receive flu shot today    2. HTN, DMwith hyperglycemia -On Amlodipine, lisinopril, metformin -HTN controlled -Will watch closely during chemo and pre-med dexa can effects her BG and BP.I also encouraged her to monitor at home. -she has poorly controlled hyperglycemia. Iinstructed her to reduce her sugar intake week of chemo. I have already reduced her dexa pre-med dose.I also encouraged her to check her BG daily.   3.CKD stage III -secondary to DM and HTN.  -I encouraged her to drink plenty of water and to avoid NSAID's -Will monitor   4. Smoking Cessation  -She smoked for 10 years before she quit. She restarted after recent cancer diagnosis  -I strongly encouraged her to quit smoking completely and continue cessation. She understands and agrees. -She states she has significantly reduced smoking. I encouraged her to continue until she quits completely.   5. Genetic Testingwasnegativefor pathogenetic mutations -Patient recently found out other than her maternal aunt, 3 of her maternal first cousins have breast cancer as well.   6. B/l leg pain -Likely secondary to chemo, no significant neuropathy of her fingers and toes -Improves with movement. I encouraged her to continue to exercise her legs even when sitting.  -She has been using her remaining muscle relaxant as needed. OK to continue     PLAN: -flu shot today  -Lab reviewed and adequate to proceed withC4 TC today -Will send message to Dr. Lisbeth Renshaw. Will likely proceed with RT soon -Lab, flush and f/u in 2 months    No problem-specific Assessment & Plan notes found for this encounter.   No orders of the defined types were placed in this encounter.  All questions were answered. The patient knows to call the clinic with any problems, questions or concerns. No barriers to learning was detected. I spent 20 minutes counseling the patient face to face. The total time spent in the appointment was 25 minutes and more than 50% was on counseling and review of test results     Truitt Merle, MD 05/24/2019   I, Joslyn Devon, am acting as scribe for Truitt Merle, MD.   I have reviewed the above documentation for accuracy and completeness, and I agree with the above.

## 2019-05-24 ENCOUNTER — Inpatient Hospital Stay: Payer: Medicare Other

## 2019-05-24 ENCOUNTER — Encounter: Payer: Self-pay | Admitting: Hematology

## 2019-05-24 ENCOUNTER — Other Ambulatory Visit: Payer: Self-pay

## 2019-05-24 ENCOUNTER — Inpatient Hospital Stay: Payer: Medicare Other | Attending: Hematology | Admitting: Hematology

## 2019-05-24 ENCOUNTER — Other Ambulatory Visit: Payer: Self-pay | Admitting: *Deleted

## 2019-05-24 ENCOUNTER — Encounter: Payer: Self-pay | Admitting: *Deleted

## 2019-05-24 VITALS — BP 141/66 | HR 88 | Temp 98.5°F | Resp 18 | Ht 66.0 in | Wt 267.3 lb

## 2019-05-24 DIAGNOSIS — F329 Major depressive disorder, single episode, unspecified: Secondary | ICD-10-CM | POA: Diagnosis not present

## 2019-05-24 DIAGNOSIS — Z7984 Long term (current) use of oral hypoglycemic drugs: Secondary | ICD-10-CM | POA: Diagnosis not present

## 2019-05-24 DIAGNOSIS — Z5111 Encounter for antineoplastic chemotherapy: Secondary | ICD-10-CM | POA: Insufficient documentation

## 2019-05-24 DIAGNOSIS — C50212 Malignant neoplasm of upper-inner quadrant of left female breast: Secondary | ICD-10-CM

## 2019-05-24 DIAGNOSIS — Z79899 Other long term (current) drug therapy: Secondary | ICD-10-CM | POA: Insufficient documentation

## 2019-05-24 DIAGNOSIS — Z170 Estrogen receptor positive status [ER+]: Secondary | ICD-10-CM | POA: Diagnosis not present

## 2019-05-24 DIAGNOSIS — M79605 Pain in left leg: Secondary | ICD-10-CM | POA: Insufficient documentation

## 2019-05-24 DIAGNOSIS — Z23 Encounter for immunization: Secondary | ICD-10-CM

## 2019-05-24 DIAGNOSIS — F419 Anxiety disorder, unspecified: Secondary | ICD-10-CM | POA: Insufficient documentation

## 2019-05-24 DIAGNOSIS — N183 Chronic kidney disease, stage 3 unspecified: Secondary | ICD-10-CM | POA: Diagnosis not present

## 2019-05-24 DIAGNOSIS — E1122 Type 2 diabetes mellitus with diabetic chronic kidney disease: Secondary | ICD-10-CM | POA: Diagnosis not present

## 2019-05-24 DIAGNOSIS — Z87891 Personal history of nicotine dependence: Secondary | ICD-10-CM | POA: Insufficient documentation

## 2019-05-24 DIAGNOSIS — M79604 Pain in right leg: Secondary | ICD-10-CM | POA: Insufficient documentation

## 2019-05-24 DIAGNOSIS — Z803 Family history of malignant neoplasm of breast: Secondary | ICD-10-CM | POA: Diagnosis not present

## 2019-05-24 DIAGNOSIS — E1136 Type 2 diabetes mellitus with diabetic cataract: Secondary | ICD-10-CM | POA: Insufficient documentation

## 2019-05-24 DIAGNOSIS — Z90712 Acquired absence of cervix with remaining uterus: Secondary | ICD-10-CM | POA: Insufficient documentation

## 2019-05-24 DIAGNOSIS — I129 Hypertensive chronic kidney disease with stage 1 through stage 4 chronic kidney disease, or unspecified chronic kidney disease: Secondary | ICD-10-CM | POA: Diagnosis not present

## 2019-05-24 DIAGNOSIS — Z95828 Presence of other vascular implants and grafts: Secondary | ICD-10-CM

## 2019-05-24 LAB — CBC WITH DIFFERENTIAL (CANCER CENTER ONLY)
Abs Immature Granulocytes: 0.03 10*3/uL (ref 0.00–0.07)
Basophils Absolute: 0.1 10*3/uL (ref 0.0–0.1)
Basophils Relative: 1 %
Eosinophils Absolute: 0 10*3/uL (ref 0.0–0.5)
Eosinophils Relative: 1 %
HCT: 33.8 % — ABNORMAL LOW (ref 36.0–46.0)
Hemoglobin: 10.6 g/dL — ABNORMAL LOW (ref 12.0–15.0)
Immature Granulocytes: 0 %
Lymphocytes Relative: 25 %
Lymphs Abs: 2 10*3/uL (ref 0.7–4.0)
MCH: 30.2 pg (ref 26.0–34.0)
MCHC: 31.4 g/dL (ref 30.0–36.0)
MCV: 96.3 fL (ref 80.0–100.0)
Monocytes Absolute: 0.7 10*3/uL (ref 0.1–1.0)
Monocytes Relative: 8 %
Neutro Abs: 5.3 10*3/uL (ref 1.7–7.7)
Neutrophils Relative %: 65 %
Platelet Count: 361 10*3/uL (ref 150–400)
RBC: 3.51 MIL/uL — ABNORMAL LOW (ref 3.87–5.11)
RDW: 19.7 % — ABNORMAL HIGH (ref 11.5–15.5)
WBC Count: 8.1 10*3/uL (ref 4.0–10.5)
nRBC: 0 % (ref 0.0–0.2)

## 2019-05-24 LAB — CMP (CANCER CENTER ONLY)
ALT: 8 U/L (ref 0–44)
AST: 9 U/L — ABNORMAL LOW (ref 15–41)
Albumin: 3.3 g/dL — ABNORMAL LOW (ref 3.5–5.0)
Alkaline Phosphatase: 55 U/L (ref 38–126)
Anion gap: 6 (ref 5–15)
BUN: 11 mg/dL (ref 6–20)
CO2: 26 mmol/L (ref 22–32)
Calcium: 8.7 mg/dL — ABNORMAL LOW (ref 8.9–10.3)
Chloride: 107 mmol/L (ref 98–111)
Creatinine: 1.05 mg/dL — ABNORMAL HIGH (ref 0.44–1.00)
GFR, Est AFR Am: >60 mL/min (ref >60)
GFR, Estimated: 58 mL/min — ABNORMAL LOW (ref >60)
Glucose, Bld: 183 mg/dL — ABNORMAL HIGH (ref 70–99)
Potassium: 3.6 mmol/L (ref 3.5–5.1)
Sodium: 139 mmol/L (ref 135–145)
Total Bilirubin: 0.8 mg/dL (ref 0.3–1.2)
Total Protein: 5.4 g/dL — ABNORMAL LOW (ref 6.5–8.1)

## 2019-05-24 MED ORDER — INFLUENZA VAC SPLIT QUAD 0.5 ML IM SUSY
PREFILLED_SYRINGE | INTRAMUSCULAR | Status: AC
  Filled 2019-05-24: qty 0.5

## 2019-05-24 MED ORDER — PALONOSETRON HCL INJECTION 0.25 MG/5ML
0.2500 mg | Freq: Once | INTRAVENOUS | Status: AC
  Administered 2019-05-24: 11:00:00 0.25 mg via INTRAVENOUS

## 2019-05-24 MED ORDER — SODIUM CHLORIDE 0.9 % IV SOLN
Freq: Once | INTRAVENOUS | Status: AC
  Administered 2019-05-24: 11:00:00 via INTRAVENOUS
  Filled 2019-05-24: qty 250

## 2019-05-24 MED ORDER — SODIUM CHLORIDE 0.9 % IV SOLN
75.0000 mg/m2 | Freq: Once | INTRAVENOUS | Status: AC
  Administered 2019-05-24: 180 mg via INTRAVENOUS
  Filled 2019-05-24: qty 18

## 2019-05-24 MED ORDER — SODIUM CHLORIDE 0.9% FLUSH
10.0000 mL | INTRAVENOUS | Status: DC | PRN
  Administered 2019-05-24: 10 mL
  Filled 2019-05-24: qty 10

## 2019-05-24 MED ORDER — HEPARIN SOD (PORK) LOCK FLUSH 100 UNIT/ML IV SOLN
500.0000 [IU] | Freq: Once | INTRAVENOUS | Status: AC | PRN
  Administered 2019-05-24: 500 [IU]
  Filled 2019-05-24: qty 5

## 2019-05-24 MED ORDER — INFLUENZA VAC SPLIT QUAD 0.5 ML IM SUSY
0.5000 mL | PREFILLED_SYRINGE | Freq: Once | INTRAMUSCULAR | Status: AC
  Administered 2019-05-24: 0.5 mL via INTRAMUSCULAR

## 2019-05-24 MED ORDER — DEXAMETHASONE SODIUM PHOSPHATE 10 MG/ML IJ SOLN
10.0000 mg | Freq: Once | INTRAMUSCULAR | Status: AC
  Administered 2019-05-24: 11:00:00 10 mg via INTRAVENOUS

## 2019-05-24 MED ORDER — PALONOSETRON HCL INJECTION 0.25 MG/5ML
INTRAVENOUS | Status: AC
  Filled 2019-05-24: qty 5

## 2019-05-24 MED ORDER — DEXAMETHASONE SODIUM PHOSPHATE 10 MG/ML IJ SOLN
INTRAMUSCULAR | Status: AC
  Filled 2019-05-24: qty 1

## 2019-05-24 MED ORDER — SODIUM CHLORIDE 0.9% FLUSH
10.0000 mL | Freq: Once | INTRAVENOUS | Status: AC
  Administered 2019-05-24: 10 mL
  Filled 2019-05-24: qty 10

## 2019-05-24 MED ORDER — SODIUM CHLORIDE 0.9 % IV SOLN
600.0000 mg/m2 | Freq: Once | INTRAVENOUS | Status: AC
  Administered 2019-05-24: 13:00:00 1420 mg via INTRAVENOUS
  Filled 2019-05-24: qty 71

## 2019-05-24 NOTE — Progress Notes (Signed)
amb  

## 2019-05-24 NOTE — Patient Instructions (Signed)
[facility] Discharge Instructions for Patients Receiving Chemotherapy  Today you received the following chemotherapy agents Taxotere, Cytoxan  To help prevent nausea and vomiting after your treatment, we encourage you to take your nausea medication as directed by your MD.   If you develop nausea and vomiting that is not controlled by your nausea medication, call the clinic.   BELOW ARE SYMPTOMS THAT SHOULD BE REPORTED IMMEDIATELY:  *FEVER GREATER THAN 100.5 F  *CHILLS WITH OR WITHOUT FEVER  NAUSEA AND VOMITING THAT IS NOT CONTROLLED WITH YOUR NAUSEA MEDICATION  *UNUSUAL SHORTNESS OF BREATH  *UNUSUAL BRUISING OR BLEEDING  TENDERNESS IN MOUTH AND THROAT WITH OR WITHOUT PRESENCE OF ULCERS  *URINARY PROBLEMS  *BOWEL PROBLEMS  UNUSUAL RASH Items with * indicate a potential emergency and should be followed up as soon as possible.  Feel free to call the clinic should you have any questions or concerns. The clinic phone number is [phone].  Please show the CHEMO ALERT CARD at check-in to the Emergency Department and triage nurse.  Coronavirus (COVID-19) Are you at risk?  Are you at risk for the Coronavirus (COVID-19)?  To be considered HIGH RISK for Coronavirus (COVID-19), you have to meet the following criteria:  . Traveled to [place], [place], [place], [place] or [place]; or in the [place] to [place], [place], [place], or [place]; and have fever, cough, and shortness of breath within the last 2 weeks of travel OR . Been in close contact with a person diagnosed with COVID-19 within the last 2 weeks and have fever, cough, and shortness of breath . IF YOU DO NOT MEET THESE CRITERIA, YOU ARE CONSIDERED LOW RISK FOR COVID-19.  What to do if you are HIGH RISK for COVID-19?  . If you are having a medical emergency, call 911. . Seek medical care right away. Before you go to a doctor's office, urgent care or emergency department, call ahead and tell  them about your recent travel, contact with someone diagnosed with COVID-19, and your symptoms. You should receive instructions from your physician's office regarding next steps of care.  . When you arrive at healthcare provider, tell the healthcare staff immediately you have returned from visiting [place], [place], [place], [place] or [place]; or traveled in the [place] to [place], [place], [place], or [place]; in the last two weeks or you have been in close contact with a person diagnosed with COVID-19 in the last 2 weeks.   . Tell the health care staff about your symptoms: fever, cough and shortness of breath. . After you have been seen by a medical provider, you will be either: o Tested for (COVID-19) and discharged home on quarantine except to seek medical care if symptoms worsen, and asked to  - Stay home and avoid contact with others until you get your results (4-5 days)  - Avoid travel on public transportation if possible (such as bus, train, or airplane) or o Sent to the Emergency Department by EMS for evaluation, COVID-19 testing, and possible admission depending on your condition and test results.  What to do if you are LOW RISK for COVID-19?  Reduce your risk of any infection by using the same precautions used for avoiding the common cold or flu:  . Wash your hands often with soap and warm water for at least 20 seconds.  If soap and water are not readily available, use an alcohol-based hand sanitizer with at least 60% alcohol.  .   or sneezing, cover your mouth and nose by coughing or sneezing into the elbow areas of your shirt or coat, into a tissue or into your sleeve (not your hands). . Avoid shaking hands with others and consider head nods or verbal greetings only. . Avoid touching your eyes, nose, or mouth with unwashed hands.  . Avoid close contact with people who are sick. . Avoid places or events with large numbers of people in one location, like concerts or  sporting events. . Carefully consider travel plans you have or are making. . If you are planning any travel outside or inside the [place], visit the CDC's Travelers' Health webpage for the latest health notices. . If you have some symptoms but not all symptoms, continue to monitor at home and seek medical attention if your symptoms worsen. . If you are having a medical emergency, call 911.   [place] / e-Visit: [email or web address]         [facility]: [facility]: [phone]                   [facility]: [phone]

## 2019-05-27 ENCOUNTER — Telehealth: Payer: Self-pay | Admitting: Hematology

## 2019-05-27 NOTE — Telephone Encounter (Signed)
Scheduled appt per 10/2 los.  Sent a message to get a calendar mailed out.

## 2019-06-10 ENCOUNTER — Telehealth: Payer: Self-pay | Admitting: Radiation Oncology

## 2019-06-11 ENCOUNTER — Other Ambulatory Visit: Payer: Self-pay

## 2019-06-11 ENCOUNTER — Ambulatory Visit
Admission: RE | Admit: 2019-06-11 | Discharge: 2019-06-11 | Disposition: A | Discharge status: Home / Self Care | Payer: Medicare Other | Source: Ambulatory Visit | Attending: Radiation Oncology | Admitting: Radiation Oncology

## 2019-06-11 ENCOUNTER — Encounter: Payer: Self-pay | Admitting: Radiation Oncology

## 2019-06-11 VITALS — Ht 66.0 in | Wt 267.0 lb

## 2019-06-11 DIAGNOSIS — C50212 Malignant neoplasm of upper-inner quadrant of left female breast: Secondary | ICD-10-CM

## 2019-06-11 DIAGNOSIS — Z170 Estrogen receptor positive status [ER+]: Secondary | ICD-10-CM

## 2019-06-11 NOTE — Progress Notes (Signed)
Radiation Oncology         [phone] ________________________________  Name: Emma Casey        MRN: [id number]  Date of Service: 06/11/2019 DOB: 02/02/1961  DH:WYSHUOH, Nicole Kindred, MD  Truitt Merle, MD     REFERRING PHYSICIAN: Truitt Merle, MD   DIAGNOSIS: The encounter diagnosis was Malignant neoplasm of upper-inner quadrant of left breast in female, estrogen receptor positive ([place]).   HISTORY OF PRESENT ILLNESS: Emma Casey is a 58 y.o. female with a  diagnosis of left  breast cancer. The patient was noted to have a screening detected abnormality in the left breast. Diagnostic imaging did not show an ultrasound correlate and her axilla was negative for adenopathy. 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 She underwent lumpectomy on 01/08/2019 and this revealed a grade 3, 1.8 cm invasive ductal carcinoma with associated DCIS and 3 sampled nodes were negative. She did have DCIS at the anterior margin and medial margin. She also had DCIS <1 mm from the inferior margin. Her oncotype was 27. She did undergo re-excision of her margins on 03/01/2019 as well as PAC placement. Her additional tissue excised revealed a 1.1 cm area of DCIS, and her inferior margin was now again <1 mm. No additional surgery was recommended. She proceeded with systemic chemotherapy between 03/22/2019 through 05/24/2019. She comes today to discuss options of adjuvant radiotherapy.    PREVIOUS RADIATION THERAPY: No   PAST MEDICAL HISTORY:  Past Medical History:  Diagnosis Date  . Anxiety   . Arthritis    right hand  . Bronchitis   . Cancer ([place]) 11/2018   left breast ca  . Depression   . Diabetes mellitus without complication ([place])   . Family history of breast cancer   . GERD (gastroesophageal reflux disease)   . Headache   . Heart murmur   . Herpes 1&2   . Hypertension   . Leg fracture, left    as a child  .  OSA on CPAP    has CPAP but has not used in over a month       PAST SURGICAL HISTORY: Past Surgical History:  Procedure Laterality Date  . ABDOMINAL HYSTERECTOMY    . BREAST LUMPECTOMY WITH RADIOACTIVE SEED AND SENTINEL LYMPH NODE BIOPSY Left 01/08/2019   Procedure: LEFT BREAST LUMPECTOMY WITH RADIOACTIVE SEED AND LEFT AXILLARY DEEP SENTINEL LYMPH NODE BIOPSY WITH BLUE DYE INJECTION;  Surgeon: Fanny Skates, MD;  Location: [facility];  Service: General;  Laterality: Left;  . COLONOSCOPY N/A 09/02/2015   Procedure: COLONOSCOPY;  Surgeon: Danie Binder, MD;  [facility];  Service: Endoscopy;  Laterality: N/A;  11:15 Am  . EYE SURGERY Right    cataract  . HAND SURGERY Right    broken bone  . PORTACATH PLACEMENT N/A 03/01/2019   Procedure: INSERTION PORT-A-CATH;  Surgeon: Fanny Skates, MD;  Location: [facility];  Service: General;  Laterality: N/A;  . RE-EXCISION OF BREAST LUMPECTOMY Left 03/01/2019   Procedure: RE-EXCISION OF LEFT BREAST LUMPECTOMY MULITPLE MARGINS;  Surgeon: Fanny Skates, MD;  Location: [facility];  Service: General;  Laterality: Left;  . TUBAL LIGATION       FAMILY HISTORY:  Family History  Problem Relation Age of Onset  . Transient ischemic attack Father   . Heart attack Father   . Stroke Brother   . Breast cancer Maternal Aunt 90  .  Breast cancer Cousin        mat first cousin  . Breast cancer Cousin        mat first cousin  . Cancer Cousin        mat first cousin     SOCIAL HISTORY:  reports that she has been smoking cigarettes. She has a 2.50 pack-year smoking history. She has never used smokeless tobacco. She reports that she does not drink alcohol or use drugs.   ALLERGIES: Asa [aspirin], Nsaids, and Penicillins   MEDICATIONS:  Current Outpatient Medications  Medication Sig Dispense Refill  . acyclovir (ZOVIRAX) 400 MG tablet Take 400 mg by mouth 2 (two) times daily.    Marland Kitchen amLODipine (NORVASC) 5 MG tablet Take 5 mg by mouth  daily.    . Ascorbic Acid (VITAMIN C) 1000 MG tablet Take 1,000 mg by mouth daily.    Marland Kitchen atorvastatin (LIPITOR) 20 MG tablet Take 20 mg by mouth daily.    . Cholecalciferol (VITAMIN D) 50 MCG (2000 UT) CAPS Take 2,000 Units by mouth daily.    . folic acid (FOLVITE) 657 MCG tablet Take 400 mcg by mouth daily.     Marland Kitchen lidocaine-prilocaine (EMLA) cream Apply to affected area once 30 g 3  . lisinopril (ZESTRIL) 2.5 MG tablet Take 2.5 mg by mouth at bedtime.    Marland Kitchen loratadine (CLARITIN) 10 MG tablet Take 10 mg by mouth daily.    . metFORMIN (GLUMETZA) 500 MG (MOD) 24 hr tablet Take 1,500 mg by mouth daily with breakfast.     . montelukast (SINGULAIR) 10 MG tablet Take 10 mg by mouth at bedtime.    Marland Kitchen omeprazole (PRILOSEC) 20 MG capsule Take 20 mg by mouth daily.    Marland Kitchen PARoxetine (PAXIL) 10 MG tablet Take 10 mg by mouth at bedtime.    . potassium chloride (MICRO-K) 10 MEQ CR capsule Take 10 mEq by mouth 2 (two) times daily.    . prochlorperazine (COMPAZINE) 10 MG tablet Take 1 tablet (10 mg total) by mouth every 6 (six) hours as needed (Nausea or vomiting). 30 tablet 1  . tiZANidine (ZANAFLEX) 4 MG capsule Take 4 mg by mouth every 8 (eight) hours.     . topiramate (TOPAMAX) 100 MG tablet Take 100 mg by mouth daily.    . vitamin B-12 (CYANOCOBALAMIN) 500 MCG tablet Take 500 mcg by mouth daily.     Marland Kitchen dexamethasone (DECADRON) 4 MG tablet Take 1 tablet (4 mg total) by mouth 2 (two) times daily. Start the day before Taxotere. Then 1 tab daily in the day after chemo for 3 days. (Patient not taking: Reported on 06/11/2019) 30 tablet 0  . fluticasone (FLONASE) 50 MCG/ACT nasal spray Place 2 sprays into both nostrils daily.     Marland Kitchen HYDROcodone-acetaminophen (NORCO) 5-325 MG tablet Take 1-2 tablets by mouth every 6 (six) hours as needed. (Patient not taking: Reported on 06/11/2019) 15 tablet 0  . lactulose (CHRONULAC) 10 GM/15ML solution Take 15 mLs (10 g total) by mouth 2 (two) times daily. (Patient not taking: Reported  on 06/11/2019) 236 mL 0  . ondansetron (ZOFRAN) 8 MG tablet Take 1 tablet (8 mg total) by mouth 2 (two) times daily as needed for refractory nausea / vomiting. Start on day 3 after chemo. (Patient not taking: Reported on 06/11/2019) 30 tablet 1   No current facility-administered medications for this encounter.      REVIEW OF SYSTEMS: On review of systems, the patient reports that she is doing well  overall. She denies any chest pain, shortness of breath, cough, fevers, chills, night sweats, unintended weight changes. She denies any bowel or bladder disturbances, and denies abdominal pain, nausea or vomiting. She is back to better stamina and energy in the last few weeks. She does have some persistent bony aches in her lower extremities since her chemotherapy though this is slightly improved. She denies any new musculoskeletal or joint aches or pains. A complete review of systems is obtained and is otherwise negative.     PHYSICAL EXAM:  Wt Readings from Last 3 Encounters:  06/11/19 267 lb (121.1 kg)  05/24/19 267 lb 4.8 oz (121.2 kg)  05/03/19 264 lb 9.6 oz (120 kg)  In general this is a well appearing African American female in no acute distress. She's alert and oriented x4 and appropriate throughout the examination. Cardiopulmonary assessment is negative for acute distress and she exhibits normal effort. Bilateral breast exam is deferred.    ECOG = 1  0 - Asymptomatic (Fully active, able to carry on all predisease activities without restriction)  1 - Symptomatic but completely ambulatory (Restricted in physically strenuous activity but ambulatory and able to carry out work of a light or sedentary nature. For example, light housework, office work)  2 - Symptomatic, <50% in bed during the day (Ambulatory and capable of all self care but unable to carry out any work activities. Up and about more than 50% of waking hours)  3 - Symptomatic, >50% in bed, but not bedbound (Capable of only  limited self-care, confined to bed or chair 50% or more of waking hours)  4 - Bedbound (Completely disabled. Cannot carry on any self-care. Totally confined to bed or chair)  5 - Death   Eustace Pen MM, Creech RH, Tormey DC, et al. [phone]). "Toxicity and response criteria of the [facility]". [place] Oncol. 5 (6): 649-55    LABORATORY DATA:  Lab Results  Component Value Date   WBC 8.1 05/24/2019   HGB 10.6 (L) 05/24/2019   HCT 33.8 (L) 05/24/2019   MCV 96.3 05/24/2019   PLT 361 05/24/2019   Lab Results  Component Value Date   NA 139 05/24/2019   K 3.6 05/24/2019   CL 107 05/24/2019   CO2 26 05/24/2019   Lab Results  Component Value Date   ALT 8 05/24/2019   AST 9 (L) 05/24/2019   ALKPHOS 55 05/24/2019   BILITOT 0.8 05/24/2019      RADIOGRAPHY: No results found.     IMPRESSION/PLAN: 1. Stage IA, cT1cN0M0 grade 3 ER/PR positive invasive ductal carcinoma with associated DCIS of the left breast. Dr. Lisbeth Renshaw discusses the final pathology findings and reviews the nature of left breast disease. She has completed surgery and systemic therapy and is ready to proceed with adjuvant radiotherapy. We reviewed the rationale for this course of treatment. We discussed the risks, benefits, short, and long term effects of radiotherapy, and the patient is interested in proceeding. Dr. Lisbeth Renshaw discusses the delivery and logistics of radiotherapy and anticipates a course of 6 1/2 weeks of radiotherapy to the left breast with prone position to avoid the heart. She will come tomorrow to simulate and will sign written consent at that time.   This encounter was provided by telemedicine platform Webex.  The patient has given verbal consent for this type of encounter and has been advised to only accept a meeting of this type in a secure network environment. The time spent during this encounter was  45 minutes. The attendants for this meeting include Blenda Nicely, RN, Dr. Lisbeth Renshaw, Hayden Pedro  and [name].  During the encounter,  Blenda Nicely, RN, Dr. Lisbeth Renshaw, and Hayden Pedro were located at [facility] Rad[facility] On[facility].  Ardyth Kelso was located at home.    The above documentation reflects my direct findings during this shared patient visit. Please see the separate note by Dr. Lisbeth Renshaw on this date for the remainder of the patient's plan of care.    Carola Rhine, PAC

## 2019-06-11 NOTE — Progress Notes (Signed)
Location of Breast Cancer: Malignant neoplasm of upper inner quadrant of left breast, ER +  Staging form: Breast, AJCC 8th Edition - Clinical stage from 12/17/2018: Stage Unknown (cTX, cN0, cM0, G2, ER+, PR+, HER2-) - Signed by Truitt Merle, MD on 12/25/2018  Did patient present with symptoms (if so, please note symptoms) or was this found on screening mammography?: Routine mammogram  Diagnostic Mammogram 11/28/2018: Suspicious mass in upper inner quadrant of left breast at posterior depth.  Histology per Pathology Report: Left Breast Re-excision 03/01/2019   Left Breast Lumpectomy 12/17/2018  Left Breast Biopsy 12/17/2018  Receptor Status: ER(+ 95%), PR (+ 30%), Her2-neu (-), Ki-67(20%)    Past/Anticipated interventions by surgeon, if any: Dr. [name]  03/01/2019 -Re-excision of left breast lumpectomy multiple margins 01/08/2019 -Left breast lumpectomy with radioactive seed and left axillary deep sentinel lymph node biopsy with blue dye injection.   Past/Anticipated interventions by medical oncology, if any: Chemotherapy  Dr. Burr Medico 05/24/2019 -She was diagnosed in 11/2018. She underwentleftbreastlumpectomyand SLN biopsyon 01/08/19. We discussedher pathology report which shows 1.8 cmIDC with DCIS,grade III,all 3nodesnegative. Her surgical margins were positive for DCIS. -Given positive margins she underwent left re-excision on 03/01/19.Pathology with negative margins. She is recovering well with left breast shooting pain. I discussed this will improve over time but may not completely resolve.  -Based on her RS 27, she will benefit from adjuvant chemo to reduce her risk of recurrence followed by RT and anti-estrogen therapy. -I started her onmoderately intensive adjuvant chemo withdocetaxel and cytoxan (TC)q3weeks for 4 cycleson 03/22/19. She is tolerating moderately well with fatigue, leg pain and loss of appetite. She has been able to maintain weight.  -Given it took her 2 weeks to  recover from bone pain from her Udenyca injection, will forgo last injection this cycle.  -Will proceed with Radiation soon with Dr Lisbeth Renshaw, after she recovers from chemo. Plan to proceed with antiestrogen therapy after radiaiton.  -She opted to keep PAC in place for now given her veins roll. I discussed this would require port flushes every 2 months. She understands.  -f/u 2 months    Lymphedema issues, if any:  No  Pain issues, if any:  No  SAFETY ISSUES:  Prior radiation? No  Pacemaker/ICD? No  Possible current pregnancy? Hysterectomy Is the patient on methotrexate? No  Current Complaints / other details:   Genetic Testing: negative on the common hereditary cancer panel.  Oncotype testing- recurrence score of 27 with risk of recurrence 16% with tamoxifen or  AI alone.  There is a benefit of chemotherapy >15%     Cori Razor, RN 06/11/2019,8:16 AM

## 2019-06-12 ENCOUNTER — Other Ambulatory Visit: Payer: Self-pay

## 2019-06-12 ENCOUNTER — Ambulatory Visit
Admission: RE | Admit: 2019-06-12 | Discharge: 2019-06-12 | Disposition: A | Discharge status: Home / Self Care | Payer: Medicare Other | Source: Ambulatory Visit | Attending: Radiation Oncology | Admitting: Radiation Oncology

## 2019-06-12 DIAGNOSIS — C50212 Malignant neoplasm of upper-inner quadrant of left female breast: Secondary | ICD-10-CM | POA: Diagnosis present

## 2019-06-12 DIAGNOSIS — Z510 Encounter for antineoplastic radiation therapy: Secondary | ICD-10-CM | POA: Insufficient documentation

## 2019-06-12 DIAGNOSIS — Z170 Estrogen receptor positive status [ER+]: Secondary | ICD-10-CM | POA: Insufficient documentation

## 2019-06-13 NOTE — Progress Notes (Signed)
  Radiation Oncology         [phone]) [phone] ________________________________  Name: Emma Casey MRN: [id number]  Date: 06/12/2019  DOB: 03-Nov-1960  Optical Surface Tracking Plan:  Since intensity modulated radiotherapy (IMRT) and 3D conformal radiation treatment methods are predicated on accurate and precise positioning for treatment, intrafraction motion monitoring is medically necessary to ensure accurate and safe treatment delivery.  The ability to quantify intrafraction motion without excessive ionizing radiation dose can only be performed with optical surface tracking. Accordingly, surface imaging offers the opportunity to obtain 3D measurements of patient position throughout IMRT and 3D treatments without excessive radiation exposure.  I am ordering optical surface tracking for this patient's upcoming course of radiotherapy. ________________________________  Kyung Rudd, MD 06/13/2019 12:14 PM    Reference:   Ursula Alert, J, et al. Surface imaging-based analysis of intrafraction motion for breast radiotherapy patients.Journal of [place], n. 6, nov. 2014. ISSN [id number].   Available at: <http://www.jacmp.org/index.php/jacmp/article/view/4957>.

## 2019-06-13 NOTE — Progress Notes (Signed)
  Radiation Oncology         [phone]) [phone] ________________________________  Name: Emma Casey MRN: [id number]  Date: 06/12/2019  DOB: [birth year]  DIAGNOSIS:     ICD-10-CM   1. Malignant neoplasm of upper-inner quadrant of left breast in female, estrogen receptor positive ([place])  C50.212    Z17.0      SIMULATION AND TREATMENT PLANNING NOTE  The patient presented for simulation prior to beginning her course of radiation treatment for her diagnosis of left-sided breast cancer. The patient was placed in a supine position on a breast board. A customized vac-lock bag was constructed and this complex treatment device will be used on a daily basis during her treatment. In this fashion, a CT scan was obtained through the chest area and an isocenter was placed near the chest wall within the breast.  The patient will be planned to receive a course of radiation initially to a dose of 50.4 Gy. This will consist of a whole breast radiotherapy technique. To accomplish this, 2 customized blocks have been designed which will correspond to medial and lateral whole breast tangent fields. This treatment will be accomplished at 1.8 Gy per fraction. A forward planning technique will also be evaluated to determine if this approach improves the plan. It is anticipated that the patient will then receive a 10 Gy boost to the seroma cavity which has been contoured. This will be accomplished at 2 Gy per fraction.   This initial treatment will consist of a 3-D conformal technique. The seroma has been contoured as the primary target structure. Additionally, dose volume histograms of both this target as well as the lungs and heart will also be evaluated. Such an approach is necessary to ensure that the target area is adequately covered while the nearby critical  normal structures are adequately spared.  Plan:  The final anticipated total dose therefore will correspond to 60.4 Gy.   Special treatment procedure was  performed today due to the extra time and effort required by myself to plan and prepare this patient for deep inspiration breath hold technique.  I have determined cardiac sparing to be of benefit to this patient to prevent long term cardiac damage due to radiation of the heart.  Bellows were placed on the patient's abdomen. To facilitate cardiac sparing, the patient was coached by the radiation therapists on breath hold techniques and breathing practice was performed. Practice waveforms were obtained. The patient was then scanned while maintaining breath hold in the treatment position.  This image was then transferred over to the imaging specialist. The imaging specialist then created a fusion of the free breathing and breath hold scans using the chest wall as the stable structure. I personally reviewed the fusion in axial, coronal and sagittal image planes.  Excellent cardiac sparing was obtained.  I felt the patient is an appropriate candidate for breath hold and the patient will be treated as such.  The image fusion was then reviewed with the patient to reinforce the necessity of reproducible breath hold.     _______________________________   Jodelle Gross, MD, PhD

## 2019-06-14 ENCOUNTER — Telehealth: Payer: Self-pay | Admitting: *Deleted

## 2019-06-14 NOTE — Telephone Encounter (Signed)
[facility] Work  Clinical Social Work received request from radiation oncology, Reinaldo Berber, with questions regarding [employer] transportation through [employer].  CSW contacted patient by phone- she stated she contacted her insurance company and they reported the providers needed to complete a standing order.  However, patient reported she plans to use [facility] transportation for upcoming rides and does not need service from insurance provider at this time.  Gwinda Maine, LCSW  Clinical Social Worker [facility]

## 2019-06-17 ENCOUNTER — Encounter: Payer: Self-pay | Admitting: *Deleted

## 2019-06-18 DIAGNOSIS — Z510 Encounter for antineoplastic radiation therapy: Secondary | ICD-10-CM | POA: Diagnosis not present

## 2019-06-21 ENCOUNTER — Ambulatory Visit: Payer: Medicare Other

## 2019-06-24 ENCOUNTER — Ambulatory Visit
Admission: RE | Admit: 2019-06-24 | Discharge: 2019-06-24 | Disposition: A | Discharge status: Home / Self Care | Payer: Medicare Other | Source: Ambulatory Visit | Attending: Radiation Oncology | Admitting: Radiation Oncology

## 2019-06-24 ENCOUNTER — Other Ambulatory Visit: Payer: Self-pay

## 2019-06-24 DIAGNOSIS — C50212 Malignant neoplasm of upper-inner quadrant of left female breast: Secondary | ICD-10-CM | POA: Diagnosis not present

## 2019-06-24 DIAGNOSIS — Z510 Encounter for antineoplastic radiation therapy: Secondary | ICD-10-CM | POA: Insufficient documentation

## 2019-06-24 DIAGNOSIS — Z170 Estrogen receptor positive status [ER+]: Secondary | ICD-10-CM | POA: Diagnosis present

## 2019-06-25 ENCOUNTER — Ambulatory Visit
Admission: RE | Admit: 2019-06-25 | Discharge: 2019-06-25 | Disposition: A | Discharge status: Home / Self Care | Payer: Medicare Other | Source: Ambulatory Visit | Attending: Radiation Oncology | Admitting: Radiation Oncology

## 2019-06-25 ENCOUNTER — Other Ambulatory Visit: Payer: Self-pay

## 2019-06-25 DIAGNOSIS — C50212 Malignant neoplasm of upper-inner quadrant of left female breast: Secondary | ICD-10-CM | POA: Diagnosis not present

## 2019-06-26 ENCOUNTER — Other Ambulatory Visit: Payer: Self-pay

## 2019-06-26 ENCOUNTER — Ambulatory Visit
Admission: RE | Admit: 2019-06-26 | Discharge: 2019-06-26 | Disposition: A | Discharge status: Home / Self Care | Payer: Medicare Other | Source: Ambulatory Visit | Attending: Radiation Oncology | Admitting: Radiation Oncology

## 2019-06-26 DIAGNOSIS — Z170 Estrogen receptor positive status [ER+]: Secondary | ICD-10-CM

## 2019-06-26 DIAGNOSIS — C50212 Malignant neoplasm of upper-inner quadrant of left female breast: Secondary | ICD-10-CM | POA: Diagnosis not present

## 2019-06-26 MED ORDER — RADIAPLEXRX EX GEL
Freq: Once | CUTANEOUS | Status: AC
  Administered 2019-06-26: 15:00:00 via TOPICAL

## 2019-06-26 MED ORDER — ALRA NON-METALLIC DEODORANT (RAD-ONC)
1.0000 "application " | Freq: Once | TOPICAL | Status: AC
  Administered 2019-06-26: 1 via TOPICAL

## 2019-06-27 ENCOUNTER — Ambulatory Visit
Admission: RE | Admit: 2019-06-27 | Discharge: 2019-06-27 | Disposition: A | Discharge status: Home / Self Care | Payer: Medicare Other | Source: Ambulatory Visit | Attending: Radiation Oncology | Admitting: Radiation Oncology

## 2019-06-27 ENCOUNTER — Other Ambulatory Visit: Payer: Self-pay

## 2019-06-27 DIAGNOSIS — C50212 Malignant neoplasm of upper-inner quadrant of left female breast: Secondary | ICD-10-CM | POA: Diagnosis not present

## 2019-06-28 ENCOUNTER — Ambulatory Visit
Admission: RE | Admit: 2019-06-28 | Discharge: 2019-06-28 | Disposition: A | Discharge status: Home / Self Care | Payer: Medicare Other | Source: Ambulatory Visit | Attending: Radiation Oncology | Admitting: Radiation Oncology

## 2019-06-28 ENCOUNTER — Other Ambulatory Visit: Payer: Self-pay

## 2019-06-28 DIAGNOSIS — C50212 Malignant neoplasm of upper-inner quadrant of left female breast: Secondary | ICD-10-CM | POA: Diagnosis not present

## 2019-07-01 ENCOUNTER — Other Ambulatory Visit: Payer: Self-pay

## 2019-07-01 ENCOUNTER — Ambulatory Visit
Admission: RE | Admit: 2019-07-01 | Discharge: 2019-07-01 | Disposition: A | Discharge status: Home / Self Care | Payer: Medicare Other | Source: Ambulatory Visit | Attending: Radiation Oncology | Admitting: Radiation Oncology

## 2019-07-01 DIAGNOSIS — C50212 Malignant neoplasm of upper-inner quadrant of left female breast: Secondary | ICD-10-CM | POA: Diagnosis not present

## 2019-07-02 ENCOUNTER — Other Ambulatory Visit: Payer: Self-pay

## 2019-07-02 ENCOUNTER — Ambulatory Visit
Admission: RE | Admit: 2019-07-02 | Discharge: 2019-07-02 | Disposition: A | Discharge status: Home / Self Care | Payer: Medicare Other | Source: Ambulatory Visit | Attending: Radiation Oncology | Admitting: Radiation Oncology

## 2019-07-02 DIAGNOSIS — C50212 Malignant neoplasm of upper-inner quadrant of left female breast: Secondary | ICD-10-CM | POA: Diagnosis not present

## 2019-07-03 ENCOUNTER — Other Ambulatory Visit: Payer: Self-pay

## 2019-07-03 ENCOUNTER — Ambulatory Visit
Admission: RE | Admit: 2019-07-03 | Discharge: 2019-07-03 | Disposition: A | Discharge status: Home / Self Care | Payer: Medicare Other | Source: Ambulatory Visit | Attending: Radiation Oncology | Admitting: Radiation Oncology

## 2019-07-03 DIAGNOSIS — C50212 Malignant neoplasm of upper-inner quadrant of left female breast: Secondary | ICD-10-CM | POA: Diagnosis not present

## 2019-07-04 ENCOUNTER — Ambulatory Visit
Admission: RE | Admit: 2019-07-04 | Discharge: 2019-07-04 | Disposition: A | Discharge status: Home / Self Care | Payer: Medicare Other | Source: Ambulatory Visit | Attending: Radiation Oncology | Admitting: Radiation Oncology

## 2019-07-04 ENCOUNTER — Other Ambulatory Visit: Payer: Self-pay

## 2019-07-04 DIAGNOSIS — C50212 Malignant neoplasm of upper-inner quadrant of left female breast: Secondary | ICD-10-CM | POA: Diagnosis not present

## 2019-07-05 ENCOUNTER — Ambulatory Visit
Admission: RE | Admit: 2019-07-05 | Discharge: 2019-07-05 | Disposition: A | Discharge status: Home / Self Care | Payer: Medicare Other | Source: Ambulatory Visit | Attending: Radiation Oncology | Admitting: Radiation Oncology

## 2019-07-05 ENCOUNTER — Other Ambulatory Visit: Payer: Self-pay | Admitting: General Surgery

## 2019-07-05 ENCOUNTER — Other Ambulatory Visit: Payer: Self-pay

## 2019-07-05 DIAGNOSIS — C50212 Malignant neoplasm of upper-inner quadrant of left female breast: Secondary | ICD-10-CM | POA: Diagnosis not present

## 2019-07-08 ENCOUNTER — Ambulatory Visit
Admission: RE | Admit: 2019-07-08 | Discharge: 2019-07-08 | Disposition: A | Discharge status: Home / Self Care | Payer: Medicare Other | Source: Ambulatory Visit | Attending: Radiation Oncology | Admitting: Radiation Oncology

## 2019-07-08 ENCOUNTER — Other Ambulatory Visit: Payer: Self-pay

## 2019-07-08 DIAGNOSIS — C50212 Malignant neoplasm of upper-inner quadrant of left female breast: Secondary | ICD-10-CM | POA: Diagnosis not present

## 2019-07-09 ENCOUNTER — Ambulatory Visit
Admission: RE | Admit: 2019-07-09 | Discharge: 2019-07-09 | Disposition: A | Discharge status: Home / Self Care | Payer: Medicare Other | Source: Ambulatory Visit | Attending: Radiation Oncology | Admitting: Radiation Oncology

## 2019-07-09 ENCOUNTER — Other Ambulatory Visit: Payer: Self-pay

## 2019-07-09 DIAGNOSIS — C50212 Malignant neoplasm of upper-inner quadrant of left female breast: Secondary | ICD-10-CM | POA: Diagnosis not present

## 2019-07-10 ENCOUNTER — Other Ambulatory Visit: Payer: Self-pay

## 2019-07-10 ENCOUNTER — Ambulatory Visit
Admission: RE | Admit: 2019-07-10 | Discharge: 2019-07-10 | Disposition: A | Discharge status: Home / Self Care | Payer: Medicare Other | Source: Ambulatory Visit | Attending: Radiation Oncology | Admitting: Radiation Oncology

## 2019-07-10 DIAGNOSIS — C50212 Malignant neoplasm of upper-inner quadrant of left female breast: Secondary | ICD-10-CM | POA: Diagnosis not present

## 2019-07-11 ENCOUNTER — Other Ambulatory Visit: Payer: Self-pay

## 2019-07-11 ENCOUNTER — Ambulatory Visit
Admission: RE | Admit: 2019-07-11 | Discharge: 2019-07-11 | Disposition: A | Discharge status: Home / Self Care | Payer: Medicare Other | Source: Ambulatory Visit | Attending: Radiation Oncology | Admitting: Radiation Oncology

## 2019-07-11 DIAGNOSIS — C50212 Malignant neoplasm of upper-inner quadrant of left female breast: Secondary | ICD-10-CM | POA: Diagnosis not present

## 2019-07-12 ENCOUNTER — Ambulatory Visit
Admission: RE | Admit: 2019-07-12 | Discharge: 2019-07-12 | Disposition: A | Discharge status: Home / Self Care | Payer: Medicare Other | Source: Ambulatory Visit | Attending: Radiation Oncology | Admitting: Radiation Oncology

## 2019-07-12 ENCOUNTER — Other Ambulatory Visit: Payer: Self-pay

## 2019-07-12 DIAGNOSIS — C50212 Malignant neoplasm of upper-inner quadrant of left female breast: Secondary | ICD-10-CM | POA: Diagnosis not present

## 2019-07-14 ENCOUNTER — Other Ambulatory Visit: Payer: Self-pay

## 2019-07-14 ENCOUNTER — Ambulatory Visit
Admission: RE | Admit: 2019-07-14 | Discharge: 2019-07-14 | Disposition: A | Discharge status: Home / Self Care | Payer: Medicare Other | Source: Ambulatory Visit | Attending: Radiation Oncology | Admitting: Radiation Oncology

## 2019-07-14 DIAGNOSIS — C50212 Malignant neoplasm of upper-inner quadrant of left female breast: Secondary | ICD-10-CM | POA: Diagnosis not present

## 2019-07-15 ENCOUNTER — Other Ambulatory Visit: Payer: Self-pay

## 2019-07-15 ENCOUNTER — Ambulatory Visit
Admission: RE | Admit: 2019-07-15 | Discharge: 2019-07-15 | Disposition: A | Discharge status: Home / Self Care | Payer: Medicare Other | Source: Ambulatory Visit | Attending: Radiation Oncology | Admitting: Radiation Oncology

## 2019-07-15 DIAGNOSIS — C50212 Malignant neoplasm of upper-inner quadrant of left female breast: Secondary | ICD-10-CM | POA: Diagnosis not present

## 2019-07-15 NOTE — Progress Notes (Signed)
[place]   [phone] [phone]   Clinic Follow up Note   Patient Care Team: Sandi Mealy, MD as PCP - General (Family Medicine) Mauro Kaufmann, RN as Oncology Nurse Navigator Rockwell Germany, RN as Oncology Nurse Navigator Fanny Skates, MD as Consulting Physician (General Surgery) Truitt Merle, MD as Consulting Physician (Hematology) Kyung Rudd, MD as Consulting Physician (Radiation Oncology)  Date of Service:  07/24/2019  CHIEF COMPLAINT:  f/u of left breast cancer  SUMMARY OF ONCOLOGIC HISTORY: Oncology History Overview Note  Cancer Staging Malignant neoplasm of upper-inner quadrant of left breast in female, estrogen receptor positive ([place]) Staging form: Breast, AJCC 8th Edition - Clinical stage from 12/17/2018: Stage Unknown (cTX, cN0, cM0, G2, ER+, PR+, HER2-) - Signed by Truitt Merle, MD on 12/25/2018 - Pathologic stage from 01/08/2019: Stage IA (pT1c, pN0, cM0, G3, ER+, PR+, HER2-) - Signed by Truitt Merle, MD on 01/22/2019     Malignant neoplasm of upper-inner quadrant of left breast in female, estrogen receptor positive ([place])  11/28/2018 Mammogram   Diagnostic Mammogram 11/28/18 IMPRESSION:  Suspcious mass in upper inner quadrant of the left breast  at posterior depth.    12/17/2018 Initial Biopsy   Diagnosis 12/17/18 Breast, left, needle core biopsy, upper inner quadrant - INVASIVE DUCTAL CARCINOMA, SEE COMMENT. - DUCTAL CARCINOMA IN SITU.   12/17/2018 Cancer Staging   Staging form: Breast, AJCC 8th Edition - Clinical stage from 12/17/2018: Stage Unknown (cTX, cN0, cM0, G2, ER+, PR+, HER2-) - Signed by Truitt Merle, MD on 12/25/2018   12/20/2018 Initial Diagnosis   Malignant neoplasm of upper-inner quadrant of left breast in female, estrogen receptor positive ([place])   12/20/2018 Receptors her2   Results: IMMUNOHISTOCHEMICAL AND MORPHOMETRIC ANALYSIS PERFORMED MANUALLY The tumor cells are NEGATIVE for Her2 (1+). Estrogen Receptor: 95%,  POSITIVE, STRONG STAINING INTENSITY Progesterone Receptor: 30%, POSITIVE, STRONG STAINING INTENSITY Proliferation Marker Ki67: 20%   01/08/2019 Surgery   LEFT BREAST LUMPECTOMY WITH RADIOACTIVE SEED AND LEFT AXILLARY DEEP SENTINEL LYMPH NODE BIOPSY WITH BLUE DYE INJECTION by Dr [name] 01/08/19    01/08/2019 Pathology Results   Diagnosis 01/08/19 1. Breast, lumpectomy, left - INVASIVE DUCTAL CARCINOMA, GRADE III/III, SPANNING 1.8 CM. - DUCTAL CARCINOMA IN SITU, HIGH GRADE. - DUCTAL CARCINOMA IN SITU IS FOCALLY PRESENT AT THE ANTERIOR MARGIN OF SPECIMEN #1. - DUCTAL CARCINOMA IN SITU IS FOCALLY LESS THAN 0.1 CM FROM THE INFERIOR MARGIN OF SPECIMEN # 1. - SEE ONCOLOGY TABLE BELOW. 2. Breast, excision, left medial margin - DUCTAL CARCINOMA IN SITU, FOCALLY PRESENT AT THE MEDIAL MARGIN OF SPECIMEN # 2. 3. Breast, excision, left posterior margin - BENIGN BREAST PARENCHYMA. - THERE IS NO EVIDENCE OF MALIGNANCY. - SEE COMMENT. 4. Lymph node, sentinel, biopsy, left axillary - THERE IS NO EVIDENCE OF CARCINOMA IN 1 OF 1 LYMPH NODE (0/1). 5. Lymph node, sentinel, biopsy, left axillary - THERE IS NO EVIDENCE OF CARCINOMA IN 1 OF 1 LYMPH NODE (0/1). 6. Lymph node, sentinel, biopsy, left axillary - THERE IS NO EVIDENCE OF CARCINOMA IN 1 OF 1 LYMPH NODE (0/1)   01/08/2019 Oncotype testing   recurrence score of 27 with risk of recurrence 16% with Tamoxifen or AI alone. There is a benefit of chemotherapy >15%    01/08/2019 Cancer Staging   Staging form: Breast, AJCC 8th Edition - Pathologic stage from 01/08/2019: Stage IA (pT1c, pN0, cM0, G3, ER+, PR+, HER2-) - Signed by Truitt Merle, MD on 01/22/2019   01/29/2019 Genetic Testing  Negative genetic testing on the common hereditary cancer panel.  The Common Hereditary Gene Panel offered by [facility] includes sequencing and/or deletion duplication testing of the following 48 genes: APC, ATM, AXIN2, BARD1, BMPR1A, BRCA1, BRCA2, BRIP1, CDH1, CDK4, CDKN2A (p14ARF),  CDKN2A (p16INK4a), CHEK2, CTNNA1, DICER1, EPCAM (Deletion/duplication testing only), GREM1 (promoter region deletion/duplication testing only), KIT, MEN1, MLH1, MSH2, MSH3, MSH6, MUTYH, NBN, NF1, NHTL1, PALB2, PDGFRA, PMS2, POLD1, POLE, PTEN, RAD50, RAD51C, RAD51D, RNF43, SDHB, SDHC, SDHD, SMAD4, SMARCA4. STK11, TP53, TSC1, TSC2, and VHL.  The following genes were evaluated for sequence changes only: SDHA and HOXB13 c.251G>A variant only. The report date is January 29, 2019.   03/01/2019 Surgery   RE-EXCISION OF LEFT BREAST LUMPECTOMY MULITPLE MARGINS by Dr. [name]  Diagnosis 1. Breast, excision, re-excision left anterior margin - BENIGN BREAST PARENCHYMA WITH PREVIOUS PROCEDURE-RELATED CHANGES - NEGATIVE FOR IN SITU OR INVASIVE CARCINOMA 2. 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 SEE NOTE - DCIS IS FOCALLY LESS THAN 1 MM FROM NEW INFERIOR MARGIN - NEGATIVE FOR INVASIVE CARCINOMA - PREVIOUS PROCEDURE-RELATED CHANGES 3. Breast, excision, re-excision left medial margin - FOCAL DUCTAL CARCINOMA IN SITU (DCIS), 0.2 CM. SEE NOTE - DCIS IS 1.5 MM FROM NEW MEDIAL MARGIN - NEGATIVE FOR INVASIVE CARCINOMA - PREVIOUS PROCEDURE-RELATED CHANGES 4. Breast, excision, re-excision left final medial argin - BENIGN BREAST PARENCHYMA WITH MILD FOCAL PREVIOUS PROCEDURE-RELATED CHANGES - NEGATIVE FOR IN SITU OR INVASIVE CARCINOMA    03/22/2019 - 05/24/2019 Chemotherapy   TC q3weeks for 4 cycles starting 03/22/19 and completed on 05/24/19    06/24/2019 - 08/09/2019 Radiation Therapy   Radiation therapy with Dr. Lisbeth Renshaw 06/24/19-08/09/19   08/2019 -  Anti-estrogen oral therapy   Letrozole 2.33m starting 08/2019      CURRENT THERAPY:  Radiation therapy with Dr. MLisbeth Renshaw11/2/20-12/18/20 Letrozole 2.544mstarting 08/2019  INTERVAL HISTORY:  [name] here for a follow up and treatment. She presents to the clinic alone. She notes radiation is going well.  She is tolerating well with mild skin changes. She plans to have PAC removed 12/30. She notes she has residual tingling from chemo. She soaks and massages them. She notes this does effects her sleep. She notes this started last month. She started using walker last few weeks of chemo from leg weakness. She denies joint pain except her weak ankle and pain from previous broken wrist. She has had hysterectomy and BSO many years ago.     REVIEW OF SYSTEMS:   Constitutional: Denies fevers, chills or abnormal weight loss Eyes: Denies blurriness of vision Ears, nose, mouth, throat, and face: Denies mucositis or sore throat Respiratory: Denies cough, dyspnea or wheezes Cardiovascular: Denies palpitation, chest discomfort or lower extremity swelling Gastrointestinal:  Denies nausea, heartburn or change in bowel habits Skin: Denies abnormal skin rashes Lymphatics: Denies new lymphadenopathy or easy bruising Neurological: (+) tingling burning sensation of feet  Behavioral/Psych: Mood is stable, no new changes  All other systems were reviewed with the patient and are negative.  MEDICAL HISTORY:  Past Medical History:  Diagnosis Date   Anxiety    Arthritis    right hand   Bronchitis    [place]   [place]   Depression    Diabetes mellitus without complication ([facility])    Family history of breast cancer    GERD (gastroesophageal reflux disease)    Headache    Heart murmur    Herpes 1&2  Hypertension    Leg fracture, left    as a child   OSA on CPAP    has CPAP but has not used in over a month    SURGICAL HISTORY: Past Surgical History:  Procedure Laterality Date   ABDOMINAL HYSTERECTOMY     BREAST LUMPECTOMY WITH RADIOACTIVE SEED AND SENTINEL LYMPH NODE BIOPSY Left 01/08/2019   Procedure: LEFT BREAST LUMPECTOMY WITH RADIOACTIVE SEED AND LEFT AXILLARY DEEP SENTINEL LYMPH NODE BIOPSY WITH BLUE DYE INJECTION;  Surgeon: Fanny Skates, MD;  Location: [facility];  Service: General;  Laterality: Left;   COLONOSCOPY N/A 09/02/2015   Procedure: COLONOSCOPY;  Surgeon: Danie Binder, MD;  [facility];  Service: Endoscopy;  Laterality: N/A;  11:15 Am   EYE SURGERY Right    cataract   HAND SURGERY Right    broken bone   PORTACATH PLACEMENT N/A 03/01/2019   Procedure: INSERTION PORT-A-CATH;  Surgeon: Fanny Skates, MD;  Location: [place];  Service: General;  Laterality: N/A;   RE-EXCISION OF BREAST LUMPECTOMY Left 03/01/2019   Procedure: RE-EXCISION OF LEFT BREAST LUMPECTOMY MULITPLE MARGINS;  Surgeon: Fanny Skates, MD;  Location: [place];  Service: General;  Laterality: Left;   TUBAL LIGATION      I have reviewed the social history and family history with the patient and they are unchanged from previous note.  ALLERGIES:  is allergic to asa [aspirin]; nsaids; and penicillins.  MEDICATIONS:  Current Outpatient Medications  Medication Sig Dispense Refill   acyclovir (ZOVIRAX) 400 MG tablet Take 400 mg by mouth 2 (two) times daily.     amLODipine (NORVASC) 5 MG tablet Take 5 mg by mouth daily.     Ascorbic Acid (VITAMIN C) 1000 MG tablet Take 1,000 mg by mouth daily.     atorvastatin (LIPITOR) 20 MG tablet Take 20 mg by mouth daily.     Cholecalciferol (VITAMIN D) 50 MCG (2000 UT) CAPS Take 2,000 Units by mouth daily.     dexamethasone (DECADRON) 4 MG tablet Take 1 tablet (4 mg total) by mouth 2 (two) times daily. Start the day before Taxotere. Then 1 tab daily in the day after chemo for 3 days. 30 tablet 0   fluticasone (FLONASE) 50 MCG/ACT nasal spray Place 2 sprays into both nostrils daily.      folic acid (FOLVITE) 469 MCG tablet Take 400 mcg by mouth daily.      HYDROcodone-acetaminophen (NORCO) 5-325 MG tablet Take 1-2 tablets by mouth every 6 (six) hours as needed. 15 tablet 0   lactulose (CHRONULAC) 10 GM/15ML solution Take 15 mLs (10 g total) by mouth 2 (two) times daily. 236 mL 0    lidocaine-prilocaine (EMLA) cream Apply to affected area once 30 g 3   lisinopril (ZESTRIL) 2.5 MG tablet Take 2.5 mg by mouth at bedtime.     loratadine (CLARITIN) 10 MG tablet Take 10 mg by mouth daily.     metFORMIN (GLUMETZA) 500 MG (MOD) 24 hr tablet Take 1,500 mg by mouth daily with breakfast.      montelukast (SINGULAIR) 10 MG tablet Take 10 mg by mouth at bedtime.     omeprazole (PRILOSEC) 20 MG capsule Take 20 mg by mouth daily.     ondansetron (ZOFRAN) 8 MG tablet Take 1 tablet (8 mg total) by mouth 2 (two) times daily as needed for refractory nausea / vomiting. Start on day 3 after chemo. 30 tablet 1   PARoxetine (PAXIL) 10 MG tablet Take 10  mg by mouth at bedtime.     potassium chloride (MICRO-K) 10 MEQ CR capsule Take 10 mEq by mouth 2 (two) times daily.     prochlorperazine (COMPAZINE) 10 MG tablet Take 1 tablet (10 mg total) by mouth every 6 (six) hours as needed (Nausea or vomiting). 30 tablet 1   tiZANidine (ZANAFLEX) 4 MG capsule Take 4 mg by mouth every 8 (eight) hours.      topiramate (TOPAMAX) 100 MG tablet Take 100 mg by mouth daily.     vitamin B-12 (CYANOCOBALAMIN) 500 MCG tablet Take 500 mcg by mouth daily.      gabapentin (NEURONTIN) 100 MG capsule Take 1 capsule (100 mg total) by mouth 3 (three) times daily. 90 capsule 0   letrozole (FEMARA) 2.5 MG tablet Take 1 tablet (2.5 mg total) by mouth daily. 90 tablet 1   No current facility-administered medications for this visit.     PHYSICAL EXAMINATION: ECOG PERFORMANCE STATUS: 1 - Symptomatic but completely ambulatory  Vitals:   07/24/19 1055  BP: 140/85  Pulse: 80  Resp: 16  Temp: 98.2 F (36.8 C)  SpO2: 99%   Filed Weights   07/24/19 1055  Weight: 259 lb 6.4 oz (117.7 kg)    GENERAL:alert, no distress and comfortable SKIN: skin color, texture, turgor are normal, no rashes or significant lesions EYES: normal, Conjunctiva are pink and non-injected, sclera clear  NECK: supple, thyroid normal  size, non-tender, without nodularity LYMPH:  no palpable lymphadenopathy in the cervical, axillary  LUNGS: clear to auscultation and percussion with normal breathing effort HEART: regular rate & rhythm and no murmurs and no lower extremity edema ABDOMEN:abdomen soft, non-tender and normal bowel sounds Musculoskeletal:no cyanosis of digits and no clubbing  NEURO: alert & oriented x 3 with fluent speech, no focal motor/sensory deficits BREAST: s/p left lumpectomy, re-excision: Surgical incisions healed well (+) Skin hyperpigmentation of left breast from RT. No palpable mass, nodules or adenopathy bilaterally. Breast exam benign.   LABORATORY DATA:  I have reviewed the data as listed CBC Latest Ref Rng & Units 07/24/2019 05/24/2019 05/03/2019  WBC 4.0 - 10.5 K/uL 4.9 8.1 9.3  Hemoglobin 12.0 - 15.0 g/dL 11.7(L) 10.6(L) 11.3(L)  Hematocrit 36.0 - 46.0 % 37.6 33.8(L) 37.2  Platelets 150 - 400 K/uL 268 361 272     CMP Latest Ref Rng & Units 07/24/2019 05/24/2019 05/03/2019  Glucose 70 - 99 mg/dL 208(H) 183(H) 221(H)  BUN 6 - 20 mg/dL 9 11 13   Creatinine 0.44 - 1.00 mg/dL 1.08(H) 1.05(H) 1.25(H)  Sodium 135 - 145 mmol/L 142 139 139  Potassium 3.5 - 5.1 mmol/L 3.3(L) 3.6 3.9  Chloride 98 - 111 mmol/L 105 107 108  CO2 22 - 32 mmol/L 28 26 24   Calcium 8.9 - 10.3 mg/dL 9.0 8.7(L) 8.9  Total Protein 6.5 - 8.1 g/dL 6.1(L) 5.4(L) 5.8(L)  Total Bilirubin 0.3 - 1.2 mg/dL 0.7 0.8 0.8  Alkaline Phos 38 - 126 U/L 59 55 64  AST 15 - 41 U/L 9(L) 9(L) 11(L)  ALT 0 - 44 U/L 8 8 9       RADIOGRAPHIC STUDIES: I have personally reviewed the radiological images as listed and agreed with the findings in the report. No results found.   ASSESSMENT & PLAN:  Dorna Mallet is a 58 y.o. female with   1.Malignant neoplasm of upper-inner quadrant of left breast,pT1cN0,stage I,ER/PR+,HER2-, GradeIII, Oncotype RS 27 -She was diagnosed in 11/2018. She underwentleftbreastlumpectomyand SLN biopsyon 01/08/19.  Given positive margins she underwent left  re-excision on 03/01/19.Oncotype showed RS 27, high risk.  -To reduce her risk of recurrence she completed 4 cycles of TC in 05/2019.  -She is currently undergoing Radiation with Dr. Lisbeth Renshaw. Plan to complete 08/09/19. She is tolerating well with mild skin change.  -Given the strong ER and PR positivity, I do recommend adjuvant aromatase inhibitor to reduce her risk of cancer recurrence with Letrozole for 7-10 years.   -The potential benefit and side effects, which includes but not limited to, hot flash, skin and vaginal dryness, metabolic changes ( increased blood glucose, cholesterol, weight, etc.), slightly in increased risk of cardiovascular disease, cataracts, muscular and joint discomfort, osteopenia and osteoporosis, etc, were discussed with her in great details. She is interested, plan to start in 08/2019.  -She is overall clinically stable. Labs reviewed, CBC and CMP WNL except mild anemia. Physical exam unremarkable. Continue RT.  -We also discussed the breast cancer surveillance. She will continue annual screening mammogram, self exam, and a routine office visit with lab and exam with [place]. -She plans to get her PAC removed on 08/21/19.  -I offered her survivorship clinic with NP [name] in 3 months. F/u with me in 6 months.    2. HTN, DMwith hyperglycemia -On Amlodipine, lisinopril, metformin -HTN controlled -She has poorly controlled hyperglycemia. Iinstructed her to reduce her sugar intake and exercise to lose weight. I also encouraged her to check her BG daily.  3.CKD stage III -secondary to DM and HTN.  -I encouraged her to drink plenty of water and to avoid NSAID's -Will monitor   4. Smoking Cessation  -She smoked for 10 years before she quit. She restarted after recent cancer diagnosis  -I strongly encouraged her to quit smoking completely and continue cessation. She understands and agrees. Alicia Amel has significantly reduced  smoking. I encouraged her to continue until she quits completely.   5. Genetic Testingwasnegativefor pathogenetic mutations -Patient recently found out other than her maternal aunt, 3 of her maternal first cousins have breast cancer as well.  6. B/l leg pain, neuropathy of feet, G1 -started after chemo. Likely secondary to chemo  -leg pain Improves with movement. She currently ambulates with walker since last few weeks of chemo. -She has been using her remaining muscle relaxantas needed.OKto continue  -She notes in the past month she has had burning/tingling sensations of her feet from chemo. I will call in Gabapentin 163m which she can titrate up to TID, and increase nigh node to 3059mgradually. I reviewed side effects of drowsiness.    PLAN: -I called in Gabapentin today and Letrozole to start in 08/2019 -[facility] -Survivorship clinic in 3 months  -Lab and f/u with me in 6 months    No problem-specific Assessment & Plan notes found for this encounter.   No orders of the defined types were placed in this encounter.  All questions were answered. The patient knows to call the clinic with any problems, questions or concerns. No barriers to learning was detected. I spent 20 minutes counseling the patient face to face. The total time spent in the appointment was 25 minutes and more than 50% was on counseling and review of test results     YaTruitt MerleMD 07/24/2019   I, AmJoslyn Devonam acting as scribe for YaTruitt MerleMD.   I have reviewed the above documentation for accuracy and completeness, and I agree with the above.

## 2019-07-16 ENCOUNTER — Ambulatory Visit
Admission: RE | Admit: 2019-07-16 | Discharge: 2019-07-16 | Disposition: A | Discharge status: Home / Self Care | Payer: Medicare Other | Source: Ambulatory Visit | Attending: Radiation Oncology | Admitting: Radiation Oncology

## 2019-07-16 DIAGNOSIS — C50212 Malignant neoplasm of upper-inner quadrant of left female breast: Secondary | ICD-10-CM | POA: Diagnosis not present

## 2019-07-17 ENCOUNTER — Other Ambulatory Visit: Payer: Self-pay

## 2019-07-17 ENCOUNTER — Ambulatory Visit
Admission: RE | Admit: 2019-07-17 | Discharge: 2019-07-17 | Disposition: A | Discharge status: Home / Self Care | Payer: Medicare Other | Source: Ambulatory Visit | Attending: Radiation Oncology | Admitting: Radiation Oncology

## 2019-07-17 DIAGNOSIS — C50212 Malignant neoplasm of upper-inner quadrant of left female breast: Secondary | ICD-10-CM

## 2019-07-17 MED ORDER — SONAFINE EX EMUL
1.0000 "application " | Freq: Two times a day (BID) | CUTANEOUS | Status: DC
  Administered 2019-07-17: 1 via TOPICAL

## 2019-07-22 ENCOUNTER — Other Ambulatory Visit: Payer: Self-pay

## 2019-07-22 ENCOUNTER — Ambulatory Visit
Admission: RE | Admit: 2019-07-22 | Discharge: 2019-07-22 | Disposition: A | Discharge status: Home / Self Care | Payer: Medicare Other | Source: Ambulatory Visit | Attending: Radiation Oncology | Admitting: Radiation Oncology

## 2019-07-22 DIAGNOSIS — C50212 Malignant neoplasm of upper-inner quadrant of left female breast: Secondary | ICD-10-CM | POA: Diagnosis not present

## 2019-07-23 ENCOUNTER — Other Ambulatory Visit: Payer: Self-pay

## 2019-07-23 ENCOUNTER — Ambulatory Visit
Admission: RE | Admit: 2019-07-23 | Discharge: 2019-07-23 | Disposition: A | Discharge status: Home / Self Care | Payer: Medicare Other | Source: Ambulatory Visit | Attending: Radiation Oncology | Admitting: Radiation Oncology

## 2019-07-23 DIAGNOSIS — Z510 Encounter for antineoplastic radiation therapy: Secondary | ICD-10-CM | POA: Diagnosis present

## 2019-07-23 DIAGNOSIS — Z170 Estrogen receptor positive status [ER+]: Secondary | ICD-10-CM | POA: Diagnosis present

## 2019-07-23 DIAGNOSIS — C50212 Malignant neoplasm of upper-inner quadrant of left female breast: Secondary | ICD-10-CM | POA: Diagnosis not present

## 2019-07-24 ENCOUNTER — Ambulatory Visit
Admission: RE | Admit: 2019-07-24 | Discharge: 2019-07-24 | Disposition: A | Discharge status: Home / Self Care | Payer: Medicare Other | Source: Ambulatory Visit | Attending: Radiation Oncology | Admitting: Radiation Oncology

## 2019-07-24 ENCOUNTER — Encounter: Payer: Self-pay | Admitting: Hematology

## 2019-07-24 ENCOUNTER — Inpatient Hospital Stay (HOSPITAL_BASED_OUTPATIENT_CLINIC_OR_DEPARTMENT_OTHER): Payer: Medicare Other | Admitting: Hematology

## 2019-07-24 ENCOUNTER — Inpatient Hospital Stay: Payer: Medicare Other | Attending: Hematology

## 2019-07-24 ENCOUNTER — Other Ambulatory Visit: Payer: Self-pay

## 2019-07-24 ENCOUNTER — Inpatient Hospital Stay: Payer: Medicare Other

## 2019-07-24 VITALS — BP 140/85 | HR 80 | Temp 98.2°F | Resp 16 | Wt 259.4 lb

## 2019-07-24 DIAGNOSIS — Z170 Estrogen receptor positive status [ER+]: Secondary | ICD-10-CM | POA: Diagnosis not present

## 2019-07-24 DIAGNOSIS — F419 Anxiety disorder, unspecified: Secondary | ICD-10-CM | POA: Insufficient documentation

## 2019-07-24 DIAGNOSIS — Z9221 Personal history of antineoplastic chemotherapy: Secondary | ICD-10-CM | POA: Diagnosis not present

## 2019-07-24 DIAGNOSIS — Z452 Encounter for adjustment and management of vascular access device: Secondary | ICD-10-CM | POA: Diagnosis not present

## 2019-07-24 DIAGNOSIS — Z87891 Personal history of nicotine dependence: Secondary | ICD-10-CM | POA: Diagnosis not present

## 2019-07-24 DIAGNOSIS — Z79899 Other long term (current) drug therapy: Secondary | ICD-10-CM | POA: Insufficient documentation

## 2019-07-24 DIAGNOSIS — Z7984 Long term (current) use of oral hypoglycemic drugs: Secondary | ICD-10-CM | POA: Insufficient documentation

## 2019-07-24 DIAGNOSIS — I10 Essential (primary) hypertension: Secondary | ICD-10-CM | POA: Insufficient documentation

## 2019-07-24 DIAGNOSIS — F329 Major depressive disorder, single episode, unspecified: Secondary | ICD-10-CM | POA: Diagnosis not present

## 2019-07-24 DIAGNOSIS — Z79811 Long term (current) use of aromatase inhibitors: Secondary | ICD-10-CM | POA: Diagnosis not present

## 2019-07-24 DIAGNOSIS — E119 Type 2 diabetes mellitus without complications: Secondary | ICD-10-CM | POA: Diagnosis not present

## 2019-07-24 DIAGNOSIS — C50212 Malignant neoplasm of upper-inner quadrant of left female breast: Secondary | ICD-10-CM | POA: Insufficient documentation

## 2019-07-24 DIAGNOSIS — Z95828 Presence of other vascular implants and grafts: Secondary | ICD-10-CM

## 2019-07-24 DIAGNOSIS — Z923 Personal history of irradiation: Secondary | ICD-10-CM | POA: Insufficient documentation

## 2019-07-24 DIAGNOSIS — Z90710 Acquired absence of both cervix and uterus: Secondary | ICD-10-CM | POA: Insufficient documentation

## 2019-07-24 DIAGNOSIS — N183 Chronic kidney disease, stage 3 unspecified: Secondary | ICD-10-CM | POA: Insufficient documentation

## 2019-07-24 LAB — CMP (CANCER CENTER ONLY)
ALT: 8 U/L (ref 0–44)
AST: 9 U/L — ABNORMAL LOW (ref 15–41)
Albumin: 3.5 g/dL (ref 3.5–5.0)
Alkaline Phosphatase: 59 U/L (ref 38–126)
Anion gap: 9 (ref 5–15)
BUN: 9 mg/dL (ref 6–20)
CO2: 28 mmol/L (ref 22–32)
Calcium: 9 mg/dL (ref 8.9–10.3)
Chloride: 105 mmol/L (ref 98–111)
Creatinine: 1.08 mg/dL — ABNORMAL HIGH (ref 0.44–1.00)
GFR, Est AFR Am: >60 mL/min (ref >60)
GFR, Estimated: 57 mL/min — ABNORMAL LOW (ref >60)
Glucose, Bld: 208 mg/dL — ABNORMAL HIGH (ref 70–99)
Potassium: 3.3 mmol/L — ABNORMAL LOW (ref 3.5–5.1)
Sodium: 142 mmol/L (ref 135–145)
Total Bilirubin: 0.7 mg/dL (ref 0.3–1.2)
Total Protein: 6.1 g/dL — ABNORMAL LOW (ref 6.5–8.1)

## 2019-07-24 LAB — CBC WITH DIFFERENTIAL (CANCER CENTER ONLY)
Abs Immature Granulocytes: 0.02 10*3/uL (ref 0.00–0.07)
Basophils Absolute: 0 10*3/uL (ref 0.0–0.1)
Basophils Relative: 0 %
Eosinophils Absolute: 0.1 10*3/uL (ref 0.0–0.5)
Eosinophils Relative: 2 %
HCT: 37.6 % (ref 36.0–46.0)
Hemoglobin: 11.7 g/dL — ABNORMAL LOW (ref 12.0–15.0)
Immature Granulocytes: 0 %
Lymphocytes Relative: 20 %
Lymphs Abs: 1 10*3/uL (ref 0.7–4.0)
MCH: 29.3 pg (ref 26.0–34.0)
MCHC: 31.1 g/dL (ref 30.0–36.0)
MCV: 94.2 fL (ref 80.0–100.0)
Monocytes Absolute: 0.3 10*3/uL (ref 0.1–1.0)
Monocytes Relative: 7 %
Neutro Abs: 3.5 10*3/uL (ref 1.7–7.7)
Neutrophils Relative %: 71 %
Platelet Count: 268 10*3/uL (ref 150–400)
RBC: 3.99 MIL/uL (ref 3.87–5.11)
RDW: 15 % (ref 11.5–15.5)
WBC Count: 4.9 10*3/uL (ref 4.0–10.5)
nRBC: 0 % (ref 0.0–0.2)

## 2019-07-24 MED ORDER — GABAPENTIN 100 MG PO CAPS: 100.0000 mg | ORAL_CAPSULE | Freq: Three times a day (TID) | ORAL | 0 refills | Status: DC

## 2019-07-24 MED ORDER — SODIUM CHLORIDE 0.9% FLUSH
10.0000 mL | Freq: Once | INTRAVENOUS | Status: AC
  Administered 2019-07-24: 10 mL
  Filled 2019-07-24: qty 10

## 2019-07-24 MED ORDER — HEPARIN SOD (PORK) LOCK FLUSH 100 UNIT/ML IV SOLN
500.0000 [IU] | Freq: Once | INTRAVENOUS | Status: AC
  Administered 2019-07-24: 11:00:00 500 [IU]
  Filled 2019-07-24: qty 5

## 2019-07-24 MED ORDER — LETROZOLE 2.5 MG PO TABS: 2.5000 mg | ORAL_TABLET | Freq: Every day | ORAL | 1 refills | Status: DC

## 2019-07-24 NOTE — Patient Instructions (Signed)

## 2019-07-25 ENCOUNTER — Ambulatory Visit
Admission: RE | Admit: 2019-07-25 | Discharge: 2019-07-25 | Disposition: A | Discharge status: Home / Self Care | Payer: Medicare Other | Source: Ambulatory Visit | Attending: Radiation Oncology | Admitting: Radiation Oncology

## 2019-07-25 ENCOUNTER — Other Ambulatory Visit: Payer: Self-pay

## 2019-07-25 ENCOUNTER — Telehealth: Payer: Self-pay | Admitting: Hematology

## 2019-07-25 DIAGNOSIS — C50212 Malignant neoplasm of upper-inner quadrant of left female breast: Secondary | ICD-10-CM | POA: Diagnosis not present

## 2019-07-25 NOTE — Telephone Encounter (Signed)
Scheduled appt per 1/22 los.  Spoke with pt and she is aware of the appt date and time. 

## 2019-07-26 ENCOUNTER — Ambulatory Visit
Admission: RE | Admit: 2019-07-26 | Discharge: 2019-07-26 | Disposition: A | Discharge status: Home / Self Care | Payer: Medicare Other | Source: Ambulatory Visit | Attending: Radiation Oncology | Admitting: Radiation Oncology

## 2019-07-26 ENCOUNTER — Ambulatory Visit: Payer: Medicare Other | Admitting: Radiation Oncology

## 2019-07-26 ENCOUNTER — Other Ambulatory Visit: Payer: Self-pay

## 2019-07-26 DIAGNOSIS — C50212 Malignant neoplasm of upper-inner quadrant of left female breast: Secondary | ICD-10-CM | POA: Diagnosis not present

## 2019-07-29 ENCOUNTER — Other Ambulatory Visit: Payer: Self-pay

## 2019-07-29 ENCOUNTER — Ambulatory Visit
Admission: RE | Admit: 2019-07-29 | Discharge: 2019-07-29 | Disposition: A | Discharge status: Home / Self Care | Payer: Medicare Other | Source: Ambulatory Visit | Attending: Radiation Oncology | Admitting: Radiation Oncology

## 2019-07-29 ENCOUNTER — Telehealth: Payer: Self-pay

## 2019-07-29 DIAGNOSIS — C50212 Malignant neoplasm of upper-inner quadrant of left female breast: Secondary | ICD-10-CM | POA: Diagnosis not present

## 2019-07-29 NOTE — Telephone Encounter (Signed)
-----   Message from Jonnie Finner, RN sent at 07/29/2019  8:55 AM EST -----  ----- Message ----- From: Truitt Merle, MD Sent: 07/27/2019   3:23 PM EST To: [facility]  Please let pt know her K was slightly low this week, and increase her oral KCL 1 tab/day (52meq) if she is on, if she is not taking it, please restart. Thanks  Truitt Merle  07/27/2019

## 2019-07-29 NOTE — Telephone Encounter (Signed)
Left vm for patient to call back.  Need to give k results and inquire about medication.

## 2019-07-30 ENCOUNTER — Other Ambulatory Visit: Payer: Self-pay

## 2019-07-30 ENCOUNTER — Ambulatory Visit
Admission: RE | Admit: 2019-07-30 | Discharge: 2019-07-30 | Disposition: A | Discharge status: Home / Self Care | Payer: Medicare Other | Source: Ambulatory Visit | Attending: Radiation Oncology | Admitting: Radiation Oncology

## 2019-07-30 DIAGNOSIS — C50212 Malignant neoplasm of upper-inner quadrant of left female breast: Secondary | ICD-10-CM | POA: Diagnosis not present

## 2019-07-31 ENCOUNTER — Other Ambulatory Visit: Payer: Self-pay

## 2019-07-31 ENCOUNTER — Ambulatory Visit
Admission: RE | Admit: 2019-07-31 | Discharge: 2019-07-31 | Disposition: A | Discharge status: Home / Self Care | Payer: Medicare Other | Source: Ambulatory Visit | Attending: Radiation Oncology | Admitting: Radiation Oncology

## 2019-07-31 DIAGNOSIS — C50212 Malignant neoplasm of upper-inner quadrant of left female breast: Secondary | ICD-10-CM | POA: Diagnosis not present

## 2019-08-01 ENCOUNTER — Ambulatory Visit
Admission: RE | Admit: 2019-08-01 | Discharge: 2019-08-01 | Disposition: A | Discharge status: Home / Self Care | Payer: Medicare Other | Source: Ambulatory Visit | Attending: Radiation Oncology | Admitting: Radiation Oncology

## 2019-08-01 ENCOUNTER — Other Ambulatory Visit: Payer: Self-pay

## 2019-08-01 DIAGNOSIS — C50212 Malignant neoplasm of upper-inner quadrant of left female breast: Secondary | ICD-10-CM | POA: Diagnosis not present

## 2019-08-02 ENCOUNTER — Ambulatory Visit
Admission: RE | Admit: 2019-08-02 | Discharge: 2019-08-02 | Disposition: A | Discharge status: Home / Self Care | Payer: Medicare Other | Source: Ambulatory Visit | Attending: Radiation Oncology | Admitting: Radiation Oncology

## 2019-08-02 ENCOUNTER — Ambulatory Visit: Payer: Medicare Other | Admitting: Radiation Oncology

## 2019-08-02 ENCOUNTER — Ambulatory Visit: Payer: Medicare Other

## 2019-08-02 ENCOUNTER — Other Ambulatory Visit: Payer: Self-pay

## 2019-08-02 DIAGNOSIS — C50212 Malignant neoplasm of upper-inner quadrant of left female breast: Secondary | ICD-10-CM | POA: Diagnosis not present

## 2019-08-05 ENCOUNTER — Other Ambulatory Visit: Payer: Self-pay

## 2019-08-05 ENCOUNTER — Ambulatory Visit
Admission: RE | Admit: 2019-08-05 | Discharge: 2019-08-05 | Disposition: A | Discharge status: Home / Self Care | Payer: Medicare Other | Source: Ambulatory Visit | Attending: Radiation Oncology | Admitting: Radiation Oncology

## 2019-08-05 ENCOUNTER — Ambulatory Visit: Payer: Medicare Other | Admitting: Radiation Oncology

## 2019-08-05 DIAGNOSIS — C50212 Malignant neoplasm of upper-inner quadrant of left female breast: Secondary | ICD-10-CM | POA: Diagnosis not present

## 2019-08-06 ENCOUNTER — Ambulatory Visit
Admission: RE | Admit: 2019-08-06 | Discharge: 2019-08-06 | Disposition: A | Discharge status: Home / Self Care | Payer: Medicare Other | Source: Ambulatory Visit | Attending: Radiation Oncology | Admitting: Radiation Oncology

## 2019-08-06 DIAGNOSIS — C50212 Malignant neoplasm of upper-inner quadrant of left female breast: Secondary | ICD-10-CM | POA: Diagnosis not present

## 2019-08-07 ENCOUNTER — Ambulatory Visit
Admission: RE | Admit: 2019-08-07 | Discharge: 2019-08-07 | Disposition: A | Discharge status: Home / Self Care | Payer: Medicare Other | Source: Ambulatory Visit | Attending: Radiation Oncology | Admitting: Radiation Oncology

## 2019-08-07 ENCOUNTER — Other Ambulatory Visit: Payer: Self-pay

## 2019-08-07 DIAGNOSIS — C50212 Malignant neoplasm of upper-inner quadrant of left female breast: Secondary | ICD-10-CM | POA: Diagnosis not present

## 2019-08-07 NOTE — H&P (Signed)
Emma Casey Location: [facility] Patient #: [id number] DOB: Apr 18, 1961 Undefined / Language: [name] / Race: Black or African American Female        History of Present Illness This is a pleasant 58 year old female who presents for long-term follow-up of her left breast cancer. Dr. Burr Medico and Dr. Lisbeth Renshaw are involved. Her PCP is [employer].  Initial surgery was on Jan 08, 2019. Left lumpectomy and reexcision of some margins and left axillary sentinel node biopsy. 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 Receptors positive. HER-2 negative. All 3 sentinel nodes negative. She had 2 positive margins and one close margin which was DCIS. She had a high risk Oncotype score. Port-A-Cath was requested. Genetic testing was negative  On March 01, 2019 I performed Port-A-Cath insertion and reexcision of multiple margins. All the margins were negative. One was somewhat close but nothing further was felt to be necessary at breast conference. She has no problems with wound healing. She has started chemotherapy and is doing well. She says her chemotherapy was completed on October 2. She's getting radiation therapy now. She knows that she will be offered anti-estrogens after that. She was a little bit ambivalent about her port but now states that she wants this removed before the end of the year and we told her we would do that.  She knows that I will be retiring in January. We reviewed all of the breast cancer surgeons in the practice. We decided that she would transition her care to Dr. Stark Klein.  Plan: Scheduled for Port-A-Cath removal in December after completion of radiation therapy I discussed the indications, techniques, and risk of the surgery with her in detail Repeat bilateral mammograms in May, 2021 and annually thereafter She Dr. Barry Dienes in June, 2021   Allergies Aspirin *ANALGESICS - NonNarcotic*  Penicillins   NSAIDs  Allergies Reconciled   Medication History Prochlorperazine Maleate (10MG Tablet, Oral) Active. Ondansetron HCl (8MG Tablet, Oral) Active. Lidocaine-Prilocaine (2.5-2.5% Cream, External) Active. Atorvastatin Calcium (20MG Tablet, Oral) Active. Fluticasone Propionate (50MCG/ACT Suspension, Nasal) Active. Lisinopril (2.5MG Tablet, Oral) Active. tiZANidine HCl (4MG Tablet, Oral) Active. Montelukast Sodium (10MG Tablet, Oral) Active. HYDROcodone-Acetaminophen (5-325MG Tablet, Oral) Active. Acyclovir (400MG Tablet, Oral) Active. Omeprazole (20MG Capsule DR, Oral) Active. Potassium Chloride ER (10MEQ Capsule ER, Oral) Active. Norvasc (5MG Tablet, Oral) Active. Vitamin C (1000MG Tablet, Oral) Active. ZyrTEC Allergy (10MG Tablet, Oral) Active. Cyanocobalamin (1000MCG Tablet Chewable, Oral) Active. Flonase (50MCG/DOSE Inhaler, Nasal) Active. Folic Acid (045WUJ Tablet, Oral) Active. Glucotrol XL (5MG Tablet ER 24HR, Oral) Active. metFORMIN HCl ER (500MG Tablet ER 24HR, Oral) Active. PriLOSEC OTC (20MG Tablet DR, Oral) Active. Paxil (10MG Tablet, Oral) Active. Topamax (100MG Tablet, Oral) Active. Zinc (Oral) Specific strength unknown - Active. Medications Reconciled  Vitals  Weight: 261.4 lb Height: 66in Body Surface Area: 2.24 m Body Mass Index: 42.19 kg/m  Temp.: 97.62F(Temporal)  Pulse: 105 (Regular)  P.OX: 97% (Room air) BP: 142/78 (Sitting, Right Wrist, Standard)       Physical Exam General Mental Status-Alert. General Appearance-Not in acute distress. Build & Nutrition-Well nourished. Posture-Normal posture. Gait-Normal.  Head and Neck Head-normocephalic, atraumatic with no lesions or palpable masses. Trachea-midline. Thyroid Gland Characteristics - normal size and consistency and no palpable nodules.  Chest and Lung Exam Chest and lung exam reveals -on auscultation, normal breath sounds, no  adventitious sounds and normal vocal resonance.  Breast Note: Transverse lumpectomy incision left breast and left axillary incision healing nicely. No  hematoma seroma or infection. Minimal volume loss and cosmetic result. Good nipple alignment. Nontender. Port site right infraclavicular area looks fine. Good range of motion both shoulders.   Cardiovascular Cardiovascular examination reveals -normal heart sounds, regular rate and rhythm with no murmurs and femoral artery auscultation bilaterally reveals normal pulses, no bruits, no thrills.  Abdomen Inspection Inspection of the abdomen reveals - No Hernias. Palpation/Percussion Palpation and Percussion of the abdomen reveal - Soft, Non Tender, No Rigidity (guarding), No hepatosplenomegaly and No Palpable abdominal masses.  Neurologic Neurologic evaluation reveals -alert and oriented x 3 with no impairment of recent or remote memory, normal attention span and ability to concentrate, normal sensation and normal coordination.  Musculoskeletal Normal Exam - Bilateral-Upper Extremity Strength Normal and Lower Extremity Strength Normal.    Assessment & Plan PRIMARY CANCER OF UPPER INNER QUADRANT OF LEFT FEMALE BREAST (C50.212)    You have decided to have your Port-A-Cath removed in December after you are finished with radiation therapy That is reasonable since Dr. Burr Medico has completed your chemotherapy and told you that it could be removed We will schedule for Port-A-Cath removal under sedation I discussed the indications, techniques, and risk of the surgery with you  Follow up with [place] in the office in 6 MONTHS.    Examination of both breast and all of the regional lymph nodes today is normal There is no evidence of cancer  you have completed your chemotherapy You requested the Port-A-Cath be removed in December after you have finished your radiation therapy, and that will be scheduled  You are aware that Dr. [name] will  retire in January Once again, we reviewed the list of breast cancer surgeons in our practice You have selected Dr. Stark Klein  Keep your regular appointments with Dr. Burr Medico She will decide about antiestrogen therapy you will be due for bilateral diagnostic mammograms in May, 2021 We will make an appointment for you to see Dr. Barry Dienes in June, 2021 for a 1 year follow-up from surgery  [place] (Z80.3) SLEEP APNEA WITH USE OF CONTINUOUS POSITIVE AIRWAY PRESSURE (CPAP) (G47.30) TYPE 2 DIABETES MELLITUS TREATED WITHOUT INSULIN (E11.9) HISTORY OF TOTAL ABDOMINAL HYSTERECTOMY AND BILATERAL SALPINGO-OOPHORECTOMY (Z90.710) BMI 40.0-44.9, ADULT (Z68.41)    Edsel Petrin. [name], M.D., [facility] General and Minimally invasive Surgery Breast and Colorectal Surgery Office:   [phone]

## 2019-08-08 ENCOUNTER — Ambulatory Visit: Payer: Medicare Other

## 2019-08-08 ENCOUNTER — Other Ambulatory Visit: Payer: Self-pay

## 2019-08-08 ENCOUNTER — Encounter: Payer: Self-pay | Admitting: Radiation Oncology

## 2019-08-08 ENCOUNTER — Ambulatory Visit
Admission: RE | Admit: 2019-08-08 | Discharge: 2019-08-08 | Disposition: A | Discharge status: Home / Self Care | Payer: Medicare Other | Source: Ambulatory Visit | Attending: Radiation Oncology | Admitting: Radiation Oncology

## 2019-08-08 DIAGNOSIS — C50212 Malignant neoplasm of upper-inner quadrant of left female breast: Secondary | ICD-10-CM | POA: Diagnosis not present

## 2019-08-09 ENCOUNTER — Encounter: Payer: Self-pay | Admitting: *Deleted

## 2019-08-09 ENCOUNTER — Ambulatory Visit: Payer: Medicare Other

## 2019-08-12 ENCOUNTER — Other Ambulatory Visit: Payer: Self-pay

## 2019-08-12 ENCOUNTER — Encounter (HOSPITAL_BASED_OUTPATIENT_CLINIC_OR_DEPARTMENT_OTHER): Payer: Self-pay | Admitting: General Surgery

## 2019-08-13 NOTE — Progress Notes (Signed)

## 2019-08-17 ENCOUNTER — Other Ambulatory Visit (HOSPITAL_COMMUNITY)
Admission: RE | Admit: 2019-08-17 | Discharge: 2019-08-17 | Disposition: A | Discharge status: Home / Self Care | Payer: Medicare Other | Source: Ambulatory Visit | Attending: General Surgery | Admitting: General Surgery

## 2019-08-17 DIAGNOSIS — Z20828 Contact with and (suspected) exposure to other viral communicable diseases: Secondary | ICD-10-CM | POA: Diagnosis not present

## 2019-08-17 DIAGNOSIS — Z01812 Encounter for preprocedural laboratory examination: Secondary | ICD-10-CM | POA: Diagnosis present

## 2019-08-18 LAB — NOVEL CORONAVIRUS, NAA (HOSP ORDER, SEND-OUT TO REF LAB; TAT 18-24 HRS): SARS-CoV-2, NAA: NOT DETECTED

## 2019-08-19 ENCOUNTER — Encounter: Payer: Self-pay | Admitting: *Deleted

## 2019-08-21 ENCOUNTER — Encounter (HOSPITAL_BASED_OUTPATIENT_CLINIC_OR_DEPARTMENT_OTHER)
Admission: RE | Disposition: A | Discharge status: Home / Self Care | Payer: Self-pay | Source: Home / Self Care | Attending: General Surgery

## 2019-08-21 ENCOUNTER — Other Ambulatory Visit: Payer: Self-pay

## 2019-08-21 ENCOUNTER — Ambulatory Visit (HOSPITAL_BASED_OUTPATIENT_CLINIC_OR_DEPARTMENT_OTHER): Payer: Medicare Other | Admitting: Certified Registered"

## 2019-08-21 ENCOUNTER — Ambulatory Visit (HOSPITAL_BASED_OUTPATIENT_CLINIC_OR_DEPARTMENT_OTHER)
Admission: RE | Admit: 2019-08-21 | Discharge: 2019-08-21 | Disposition: A | Discharge status: Home / Self Care | Payer: Medicare Other | Attending: General Surgery | Admitting: General Surgery

## 2019-08-21 ENCOUNTER — Encounter (HOSPITAL_BASED_OUTPATIENT_CLINIC_OR_DEPARTMENT_OTHER): Payer: Self-pay | Admitting: General Surgery

## 2019-08-21 DIAGNOSIS — G4730 Sleep apnea, unspecified: Secondary | ICD-10-CM | POA: Insufficient documentation

## 2019-08-21 DIAGNOSIS — E119 Type 2 diabetes mellitus without complications: Secondary | ICD-10-CM | POA: Insufficient documentation

## 2019-08-21 DIAGNOSIS — C50212 Malignant neoplasm of upper-inner quadrant of left female breast: Secondary | ICD-10-CM | POA: Insufficient documentation

## 2019-08-21 DIAGNOSIS — Z7984 Long term (current) use of oral hypoglycemic drugs: Secondary | ICD-10-CM | POA: Diagnosis not present

## 2019-08-21 DIAGNOSIS — Z886 Allergy status to analgesic agent status: Secondary | ICD-10-CM | POA: Insufficient documentation

## 2019-08-21 DIAGNOSIS — Z880 Allergy status to penicillin: Secondary | ICD-10-CM | POA: Insufficient documentation

## 2019-08-21 DIAGNOSIS — Z452 Encounter for adjustment and management of vascular access device: Secondary | ICD-10-CM | POA: Insufficient documentation

## 2019-08-21 DIAGNOSIS — Z95828 Presence of other vascular implants and grafts: Secondary | ICD-10-CM

## 2019-08-21 DIAGNOSIS — Z923 Personal history of irradiation: Secondary | ICD-10-CM | POA: Diagnosis not present

## 2019-08-21 DIAGNOSIS — Z79899 Other long term (current) drug therapy: Secondary | ICD-10-CM | POA: Insufficient documentation

## 2019-08-21 DIAGNOSIS — Z170 Estrogen receptor positive status [ER+]: Secondary | ICD-10-CM

## 2019-08-21 HISTORY — PX: PORT-A-CATH REMOVAL: SHX5289

## 2019-08-21 LAB — GLUCOSE, CAPILLARY
Glucose-Capillary: 100 mg/dL — ABNORMAL HIGH (ref 70–99)
Glucose-Capillary: 93 mg/dL (ref 70–99)

## 2019-08-21 SURGERY — REMOVAL PORT-A-CATH
Anesthesia: Monitor Anesthesia Care | Site: Chest | Laterality: Right

## 2019-08-21 MED ORDER — LACTATED RINGERS IV SOLN: INTRAVENOUS | Status: DC

## 2019-08-21 MED ORDER — MIDAZOLAM HCL 5 MG/5ML IJ SOLN
INTRAMUSCULAR | Status: DC | PRN
  Administered 2019-08-21: 2 mg via INTRAVENOUS

## 2019-08-21 MED ORDER — OXYCODONE HCL 5 MG PO TABS: 5.0000 mg | ORAL_TABLET | Freq: Once | ORAL | Status: DC | PRN

## 2019-08-21 MED ORDER — CELECOXIB 200 MG PO CAPS: 200.0000 mg | ORAL_CAPSULE | ORAL | Status: DC

## 2019-08-21 MED ORDER — LIDOCAINE 1 % OPTIME INJ - NO CHARGE
INTRAMUSCULAR | Status: DC | PRN
  Administered 2019-08-21: 18 mL

## 2019-08-21 MED ORDER — FENTANYL CITRATE (PF) 100 MCG/2ML IJ SOLN
INTRAMUSCULAR | Status: DC | PRN
  Administered 2019-08-21: 50 ug via INTRAVENOUS

## 2019-08-21 MED ORDER — FENTANYL CITRATE (PF) 100 MCG/2ML IJ SOLN
INTRAMUSCULAR | Status: AC
  Filled 2019-08-21: qty 2

## 2019-08-21 MED ORDER — ACETAMINOPHEN 500 MG PO TABS
1000.0000 mg | ORAL_TABLET | ORAL | Status: AC
  Administered 2019-08-21: 1000 mg via ORAL

## 2019-08-21 MED ORDER — CHLORHEXIDINE GLUCONATE CLOTH 2 % EX PADS: 6.0000 | MEDICATED_PAD | Freq: Once | CUTANEOUS | Status: DC

## 2019-08-21 MED ORDER — SODIUM CHLORIDE 0.9% FLUSH: 3.0000 mL | Freq: Two times a day (BID) | INTRAVENOUS | Status: DC

## 2019-08-21 MED ORDER — ONDANSETRON HCL 4 MG/2ML IJ SOLN
INTRAMUSCULAR | Status: DC | PRN
  Administered 2019-08-21: 4 mg via INTRAVENOUS

## 2019-08-21 MED ORDER — SODIUM BICARBONATE 4 % IV SOLN
INTRAVENOUS | Status: DC | PRN
  Administered 2019-08-21: 1 mL via SUBCUTANEOUS

## 2019-08-21 MED ORDER — OXYCODONE HCL 5 MG/5ML PO SOLN: 5.0000 mg | Freq: Once | ORAL | Status: DC | PRN

## 2019-08-21 MED ORDER — ONDANSETRON HCL 4 MG/2ML IJ SOLN: 4.0000 mg | Freq: Four times a day (QID) | INTRAMUSCULAR | Status: DC | PRN

## 2019-08-21 MED ORDER — FENTANYL CITRATE (PF) 100 MCG/2ML IJ SOLN: 25.0000 ug | INTRAMUSCULAR | Status: DC | PRN

## 2019-08-21 MED ORDER — PROPOFOL 500 MG/50ML IV EMUL
INTRAVENOUS | Status: DC | PRN
  Administered 2019-08-21: 75 ug/kg/min via INTRAVENOUS

## 2019-08-21 MED ORDER — GABAPENTIN 300 MG PO CAPS
ORAL_CAPSULE | ORAL | Status: AC
  Filled 2019-08-21: qty 1

## 2019-08-21 MED ORDER — ACETAMINOPHEN 500 MG PO TABS
ORAL_TABLET | ORAL | Status: AC
  Filled 2019-08-21: qty 2

## 2019-08-21 MED ORDER — GABAPENTIN 300 MG PO CAPS: 300.0000 mg | ORAL_CAPSULE | ORAL | Status: DC

## 2019-08-21 MED ORDER — MIDAZOLAM HCL 2 MG/2ML IJ SOLN
INTRAMUSCULAR | Status: AC
  Filled 2019-08-21: qty 2

## 2019-08-21 MED ORDER — CELECOXIB 200 MG PO CAPS
ORAL_CAPSULE | ORAL | Status: AC
  Filled 2019-08-21: qty 1

## 2019-08-21 SURGICAL SUPPLY — 34 items
BENZOIN TINCTURE PRP APPL 2/3 (GAUZE/BANDAGES/DRESSINGS) IMPLANT
BLADE HEX COATED 2.75 (ELECTRODE) ×2 IMPLANT
BLADE SURG 15 STRL LF DISP TIS (BLADE) ×1 IMPLANT
BLADE SURG 15 STRL SS (BLADE) ×1
CHLORAPREP W/TINT 26 (MISCELLANEOUS) ×2 IMPLANT
COVER BACK TABLE REUSABLE LG (DRAPES) ×2 IMPLANT
COVER MAYO STAND REUSABLE (DRAPES) ×2 IMPLANT
COVER WAND RF STERILE (DRAPES) IMPLANT
DECANTER SPIKE VIAL GLASS SM (MISCELLANEOUS) IMPLANT
DERMABOND ADVANCED (GAUZE/BANDAGES/DRESSINGS) ×1
DERMABOND ADVANCED .7 DNX12 (GAUZE/BANDAGES/DRESSINGS) ×1 IMPLANT
DRAPE LAPAROTOMY 100X72 PEDS (DRAPES) ×2 IMPLANT
DRAPE UTILITY XL STRL (DRAPES) ×2 IMPLANT
DRSG TEGADERM 4X4.75 (GAUZE/BANDAGES/DRESSINGS) IMPLANT
ELECT REM PT RETURN 9FT ADLT (ELECTROSURGICAL) ×2
ELECTRODE REM PT RTRN 9FT ADLT (ELECTROSURGICAL) ×1 IMPLANT
GAUZE SPONGE 4X4 12PLY STRL LF (GAUZE/BANDAGES/DRESSINGS) IMPLANT
GLOVE SS BIOGEL STRL SZ 7 (GLOVE) ×1 IMPLANT
GLOVE SUPERSENSE BIOGEL SZ 7 (GLOVE) ×1
GOWN STRL REUS W/ TWL LRG LVL3 (GOWN DISPOSABLE) ×1 IMPLANT
GOWN STRL REUS W/ TWL XL LVL3 (GOWN DISPOSABLE) ×1 IMPLANT
GOWN STRL REUS W/TWL LRG LVL3 (GOWN DISPOSABLE) ×1
GOWN STRL REUS W/TWL XL LVL3 (GOWN DISPOSABLE) ×1
NEEDLE HYPO 25X1 1.5 SAFETY (NEEDLE) ×2 IMPLANT
PACK BASIN DAY SURGERY FS (CUSTOM PROCEDURE TRAY) ×2 IMPLANT
PENCIL SMOKE EVACUATOR (MISCELLANEOUS) ×2 IMPLANT
SLEEVE SCD COMPRESS KNEE MED (MISCELLANEOUS) ×2 IMPLANT
STRIP CLOSURE SKIN 1/2X4 (GAUZE/BANDAGES/DRESSINGS) IMPLANT
SUT MNCRL AB 4-0 PS2 18 (SUTURE) ×2 IMPLANT
SUT VICRYL 3-0 CR8 SH (SUTURE) ×2 IMPLANT
SYR 10ML LL (SYRINGE) ×2 IMPLANT
TOWEL GREEN STERILE FF (TOWEL DISPOSABLE) ×2 IMPLANT
TUBE CONNECTING 20X1/4 (TUBING) IMPLANT
YANKAUER SUCT BULB TIP NO VENT (SUCTIONS) IMPLANT

## 2019-08-21 NOTE — Interval H&P Note (Signed)
History and Physical Interval Note:  08/21/2019 9:23 AM  Emma Casey  has presented today for surgery, with the diagnosis of LEFT BREAST CANCER.  The various methods of treatment have been discussed with the patient and family. After consideration of risks, benefits and other options for treatment, the patient has consented to  Procedure(s): REMOVAL PORT-A-CATH (N/A) as a surgical intervention.  The patient's history has been reviewed, patient examined, no change in status, stable for surgery.  I have reviewed the patient's chart and labs.  Questions were answered to the patient's satisfaction.     Adin Hector

## 2019-08-21 NOTE — Transfer of Care (Signed)
Immediate Anesthesia Transfer of Care Note  Patient: Emma Casey  Procedure(s) Performed: REMOVAL PORT-A-CATH (Right Chest)  Patient Location: PACU  Anesthesia Type:MAC  Level of Consciousness: drowsy  Airway & Oxygen Therapy: Patient Spontanous Breathing and Patient connected to face mask oxygen  Post-op Assessment: Report given to RN and Post -op Vital signs reviewed and stable  Post vital signs: Reviewed and stable  Last Vitals:  Vitals Value Taken Time  BP 142/69 08/21/19 1056  Temp    Pulse 76 08/21/19 1058  Resp 18 08/21/19 1058  SpO2 100 % 08/21/19 1058  Vitals shown include unvalidated device data.  Last Pain:  Vitals:   08/21/19 1007  TempSrc: Oral  PainSc: 0-No pain      Patients Stated Pain Goal: 3 (96/43/83 8184)  Complications: No apparent anesthesia complications

## 2019-08-21 NOTE — Anesthesia Preprocedure Evaluation (Signed)
Anesthesia Evaluation  Patient identified by MRN, date of birth, ID band Patient awake    Reviewed: Allergy & Precautions, H&P , NPO status , Patient's Chart, lab work & pertinent test results  Airway Mallampati: II   Neck ROM: full    Dental   Pulmonary sleep apnea , Current Smoker and Patient abstained from smoking.,    breath sounds clear to auscultation       Cardiovascular hypertension,  Rhythm:regular Rate:Normal     Neuro/Psych  Headaches, PSYCHIATRIC DISORDERS Anxiety Depression    GI/Hepatic GERD  ,  Endo/Other  diabetes, Type 2Morbid obesity  Renal/GU      Musculoskeletal  (+) Arthritis ,   Abdominal   Peds  Hematology   Anesthesia Other Findings   Reproductive/Obstetrics Breast CA                             Anesthesia Physical Anesthesia Plan  ASA: III  Anesthesia Plan: MAC   Post-op Pain Management:    Induction: Intravenous  PONV Risk Score and Plan: 1 and Ondansetron, Treatment may vary due to age or medical condition, Propofol infusion and Midazolam  Airway Management Planned: Simple Face Mask  Additional Equipment:   Intra-op Plan:   Post-operative Plan:   Informed Consent: I have reviewed the patients History and Physical, chart, labs and discussed the procedure including the risks, benefits and alternatives for the proposed anesthesia with the patient or authorized representative who has indicated his/her understanding and acceptance.       Plan Discussed with: CRNA, Anesthesiologist and Surgeon  Anesthesia Plan Comments:         Anesthesia Quick Evaluation

## 2019-08-21 NOTE — Discharge Instructions (Signed)
Ice pack to wound for 10 minutes at a time, intermittently, today and tomorrow The clear plastic superglue will wear off in about 3 weeks You may start taking quick showers tomorrow  You may drive your car in a day or 2 when you are comfortable  For pain, take Tylenol, 1000 mg every 6 hours for 48 hours around-the-clock. (NO Tylenol until 4:30pm)  This is all the pain medicine you will need  Keep your regular appointments with Dr. Burr Medico. Repeat bilateral diagnostic mammograms in May, 2021 and annually thereafter See my partner, Dr. Barry Dienes, in June, after the mammograms are done   Post Anesthesia Home Care Instructions  Activity: Get plenty of rest for the remainder of the day. A responsible individual must stay with you for 24 hours following the procedure.  For the next 24 hours, DO NOT: -Drive a car -Paediatric nurse -Drink alcoholic beverages -Take any medication unless instructed by your physician -Make any legal decisions or sign important papers.  Meals: Start with liquid foods such as gelatin or soup. Progress to regular foods as tolerated. Avoid greasy, spicy, heavy foods. If nausea and/or vomiting occur, drink only clear liquids until the nausea and/or vomiting subsides. Call your physician if vomiting continues.  Special Instructions/Symptoms: Your throat may feel dry or sore from the anesthesia or the breathing tube placed in your throat during surgery. If this causes discomfort, gargle with warm salt water. The discomfort should disappear within 24 hours.  If you had a scopolamine patch placed behind your ear for the management of post- operative nausea and/or vomiting:  1. The medication in the patch is effective for 72 hours, after which it should be removed.  Wrap patch in a tissue and discard in the trash. Wash hands thoroughly with soap and water. 2. You may remove the patch earlier than 72 hours if you experience unpleasant side effects which may include dry mouth,  dizziness or visual disturbances. 3. Avoid touching the patch. Wash your hands with soap and water after contact with the patch.

## 2019-08-21 NOTE — Op Note (Signed)
Patient Name:           Emma Casey   Date of Surgery:        08/21/2019  Pre op Diagnosis:      Invasive cancer left breast                                       Port-A-Cath in place   Post op Diagnosis:    Same  Procedure:                 Removal of Port-A-Cath  Surgeon:                     Edsel Petrin. [name], M.D., FACS  Assistant:                      Or staff  Operative Indications:   This is a pleasant 58 year old female who presents for removal of her Port-A-Cath.Dr. Burr Medico and Dr. Lisbeth Renshaw are involved. Her PCP is [employer].    Initial surgery was on Jan 08, 2019. Left lumpectomy and reexcision of some margins and left axillary sentinel node biopsy. 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 Receptors positive. HER-2 negative. All 3 sentinel nodes negative. She had 2 positive margins and one close margin which was DCIS. She had a high risk Oncotype score. Port-A-Cath was requested. Genetic testing was negative       On March 01, 2019 I performed Port-A-Cath insertion and reexcision of multiple margins. All the margins were negative. One was somewhat close but nothing further was felt to be necessary at breast conference. She has no problems with wound healing. She has completed chemotherapy.  She has completed radiation therapy.  She requested that her port be removed prior to the end of the year.      She knows that I will be retiring in January. l       Repeat bilateral mammograms in May, 2021 and annually thereafter See Dr. Barry Dienes in June, 2021  Operative Findings:       The port and catheter were removed from the right infraclavicular area without difficulty.  There was no evidence of bleeding infection or other wound complication.  Procedure in Detail:          The patient was brought to the operating room and placed supine on the operating table.  She was monitored and sedated by the anesthesia department.  1% Xylocaine with  epinephrine was used as local infiltration anesthetic.  Surgical timeout was performed.  Transverse incision was made in the right infraclavicular area through the old scar.  The port was dissected away from its capsule.  All 3 Prolene sutures were cut and removed.  The port and catheter were removed uneventfully.  Subcutaneous tissue was closed with 3-0 Vicryl sutures and the skin closed with subcuticular 4-0 Monocryl and Dermabond.  She tolerated the procedure well and was taken to PACU in stable condition.  EBL 2 cc.  Counts correct.  Complications none.     Edsel Petrin. [name], M.D., FACS General and Minimally Invasive Surgery Breast and Colorectal Surgery  08/21/2019 10:54 AM

## 2019-08-22 NOTE — Anesthesia Postprocedure Evaluation (Signed)
Anesthesia Post Note  Patient: Emma Casey  Procedure(s) Performed: REMOVAL PORT-A-CATH (Right Chest)     Patient location during evaluation: PACU Anesthesia Type: MAC Level of consciousness: awake and alert Pain management: pain level controlled Vital Signs Assessment: post-procedure vital signs reviewed and stable Respiratory status: spontaneous breathing, nonlabored ventilation, respiratory function stable and patient connected to nasal cannula oxygen Cardiovascular status: stable and blood pressure returned to baseline Postop Assessment: no apparent nausea or vomiting Anesthetic complications: no    Last Vitals:  Vitals:   08/21/19 1145 08/21/19 1223  BP: (!) 165/90 140/89  Pulse: 82 84  Resp: 15 18  Temp:  36.8 C  SpO2: 98% 100%    Last Pain:  Vitals:   08/22/19 0944  TempSrc:   PainSc: [place]

## 2019-09-04 ENCOUNTER — Other Ambulatory Visit: Payer: Self-pay | Admitting: Hematology

## 2019-09-04 ENCOUNTER — Telehealth: Payer: Self-pay | Admitting: Radiation Oncology

## 2019-09-04 NOTE — Telephone Encounter (Signed)
  Radiation Oncology         [phone]) [phone] ________________________________  Name: Emma Casey MRN: [id number]  Date of Service: 09/04/2019  DOB: 07/04/1961  Post Treatment Telephone Note  Diagnosis:   Stage IA, cT1cN0M0 grade 3 ER/PR positive invasive ductal carcinoma with associated DCIS of the left breast.  Interval Since Last Radiation:  4 weeks   06/24/2019-08/08/2019: The left breast was treated to 50.4 Gy as well as a 10 Gy  Narrative:  The patient was contacted today for routine follow-up. During treatment she did very well with radiotherapy and did not have significant desquamation. She reports she is doing well and seeing some of the dry hyperpigmentation of the skin on her breast slough away. She is excited for her skin to go back to a normal color. She has also had her left sided PAC removed and will follow up with Dr. Burr Medico in a few months.  Impression/Plan: 1. Stage IA, cT1cN0M0 grade 3 ER/PR positive invasive ductal carcinoma with associated DCIS of the left breast. The patient has been doing well since completion of radiotherapy. We discussed that we would be happy to continue to follow her as needed, but she will also continue to follow up with Dr. Burr Medico in medical oncology. She was counseled on skin care as well as measures to avoid sun exposure to this area.  2. Survivorship. We discussed the importance of survivorship evaluation and was given the phone number for Ottis Stain [phone]) to be added to the list to receive the monthly resource calendar for the cancer center.     Carola Rhine, PAC

## 2019-10-08 NOTE — Progress Notes (Signed)
  Radiation Oncology         [phone]) [phone] ________________________________  Name: Emma Casey MRN: [id number]  Date: 08/08/2019  DOB: 11-08-1960  End of Treatment Note  Diagnosis:   left-sided breast cancer     Indication for treatment:  Curative       Radiation treatment dates:   06/24/19 - 08/08/19  Site/dose:   The patient initially received a dose of 50.4 Gy in 28 fractions to the breast using whole-breast tangent fields. This was delivered using a 3-D conformal technique. The patient then received a boost to the seroma. This delivered an additional 10 Gy in 5 fractions using a 3-field photon boost technique. The total dose was 60.4 Gy.  Narrative: The patient tolerated radiation treatment relatively well.   The patient had some expected skin irritation as she progressed during treatment. Moist desquamation was not present at the end of treatment.  Plan: The patient has completed radiation treatment. The patient will return to radiation oncology clinic for routine followup in one month. I advised the patient to call or return sooner if they have any questions or concerns related to their recovery or treatment. ________________________________  Jodelle Gross, M.D., Ph.D.

## 2019-10-23 ENCOUNTER — Inpatient Hospital Stay: Payer: Medicare Other | Attending: Hematology | Admitting: Nurse Practitioner

## 2019-10-23 ENCOUNTER — Encounter: Payer: Self-pay | Admitting: Nurse Practitioner

## 2019-10-23 DIAGNOSIS — E2839 Other primary ovarian failure: Secondary | ICD-10-CM | POA: Diagnosis not present

## 2019-10-23 DIAGNOSIS — Z170 Estrogen receptor positive status [ER+]: Secondary | ICD-10-CM | POA: Diagnosis not present

## 2019-10-23 DIAGNOSIS — C50212 Malignant neoplasm of upper-inner quadrant of left female breast: Secondary | ICD-10-CM

## 2019-10-23 NOTE — Progress Notes (Signed)
Patient Care Team: Leeanne Rio, MD as PCP - General (Family Medicine) Mauro Kaufmann, RN as Oncology Nurse Navigator Rockwell Germany, RN as Oncology Nurse Navigator Fanny Skates, MD as Consulting Physician (General Surgery) Truitt Merle, MD as Consulting Physician (Hematology) Kyung Rudd, MD as Consulting Physician (Radiation Oncology) Alla Feeling, NP as Nurse Practitioner (Nurse Practitioner)   Clinic: Survivorship   I connected with Emma Casey on 10/23/19 at 10:55 AM EST by Video-enabled media and verified that I am speaking with the correct person using two identifiers.  I discussed the limitations, risks, security and privacy concerns of performing an evaluation and management service by telephone and the availability of in person appointments. I also discussed with the patient that there may be a patient responsible charge related to this service. The patient expressed understanding and agreed to proceed.   Patient's location: Home Provider's location: [place] office   BRIEF ONCOLOGIC HISTORY:  Oncology History Overview Note  Cancer Staging Malignant neoplasm of upper-inner quadrant of left breast in female, estrogen receptor positive ([place]) Staging form: Breast, AJCC 8th Edition - Clinical stage from 12/17/2018: Stage Unknown (cTX, cN0, cM0, G2, ER+, PR+, HER2-) - Signed by Truitt Merle, MD on 12/25/2018 - Pathologic stage from 01/08/2019: Stage IA (pT1c, pN0, cM0, G3, ER+, PR+, HER2-) - Signed by Truitt Merle, MD on 01/22/2019     Malignant neoplasm of upper-inner quadrant of left breast in female, estrogen receptor positive (Emma Casey)  11/28/2018 Mammogram   Diagnostic Mammogram 11/28/18 IMPRESSION:  Suspcious mass in upper inner quadrant of the left breast  at posterior depth.    12/17/2018 Initial Biopsy   Diagnosis 12/17/18 Breast, left, needle core biopsy, upper inner quadrant - INVASIVE DUCTAL CARCINOMA, SEE COMMENT. - DUCTAL CARCINOMA IN SITU.   12/17/2018 Cancer Staging    Staging form: Breast, AJCC 8th Edition - Clinical stage from 12/17/2018: Stage Unknown (cTX, cN0, cM0, G2, ER+, PR+, HER2-) - Signed by Truitt Merle, MD on 12/25/2018   12/20/2018 Initial Diagnosis   Malignant neoplasm of upper-inner quadrant of left breast in female, estrogen receptor positive ([name])   12/20/2018 Receptors her2   Results: IMMUNOHISTOCHEMICAL AND MORPHOMETRIC ANALYSIS PERFORMED MANUALLY The tumor cells are NEGATIVE for Her2 (1+). Estrogen Receptor: 95%, POSITIVE, STRONG STAINING INTENSITY Progesterone Receptor: 30%, POSITIVE, STRONG STAINING INTENSITY Proliferation Marker Ki67: 20%   01/08/2019 Surgery   LEFT BREAST LUMPECTOMY WITH RADIOACTIVE SEED AND LEFT AXILLARY DEEP SENTINEL LYMPH NODE BIOPSY WITH BLUE DYE INJECTION by Dr [name] 01/08/19    01/08/2019 Pathology Results   Diagnosis 01/08/19 1. Breast, lumpectomy, left - INVASIVE DUCTAL CARCINOMA, GRADE III/III, SPANNING 1.8 CM. - DUCTAL CARCINOMA IN SITU, HIGH GRADE. - DUCTAL CARCINOMA IN SITU IS FOCALLY PRESENT AT THE ANTERIOR MARGIN OF SPECIMEN #1. - DUCTAL CARCINOMA IN SITU IS FOCALLY LESS THAN 0.1 CM FROM THE INFERIOR MARGIN OF SPECIMEN # 1. - SEE ONCOLOGY TABLE BELOW. 2. Breast, excision, left medial margin - DUCTAL CARCINOMA IN SITU, FOCALLY PRESENT AT THE MEDIAL MARGIN OF SPECIMEN # 2. 3. Breast, excision, left posterior margin - BENIGN BREAST PARENCHYMA. - THERE IS NO EVIDENCE OF MALIGNANCY. - SEE COMMENT. 4. Lymph node, sentinel, biopsy, left axillary - THERE IS NO EVIDENCE OF CARCINOMA IN 1 OF 1 LYMPH NODE (0/1). 5. Lymph node, sentinel, biopsy, left axillary - THERE IS NO EVIDENCE OF CARCINOMA IN 1 OF 1 LYMPH NODE (0/1). 6. Lymph node, sentinel, biopsy, left axillary - THERE IS NO EVIDENCE OF CARCINOMA IN 1 OF 1 LYMPH NODE (  0/1)   01/08/2019 Oncotype testing   recurrence score of 27 with risk of recurrence 16% with Tamoxifen or AI alone. There is a benefit of chemotherapy >15%    01/08/2019 Cancer Staging    Staging form: Breast, AJCC 8th Edition - Pathologic stage from 01/08/2019: Stage IA (pT1c, pN0, cM0, G3, ER+, PR+, HER2-) - Signed by Truitt Merle, MD on 01/22/2019   01/29/2019 Genetic Testing   Negative genetic testing on the common hereditary cancer panel.  The Common Hereditary Gene Panel offered by [facility] includes sequencing and/or deletion duplication testing of the following 48 genes: APC, ATM, AXIN2, BARD1, BMPR1A, BRCA1, BRCA2, BRIP1, CDH1, CDK4, CDKN2A (p14ARF), CDKN2A (p16INK4a), CHEK2, CTNNA1, DICER1, EPCAM (Deletion/duplication testing only), GREM1 (promoter region deletion/duplication testing only), KIT, MEN1, MLH1, MSH2, MSH3, MSH6, MUTYH, NBN, NF1, NHTL1, PALB2, PDGFRA, PMS2, POLD1, POLE, PTEN, RAD50, RAD51C, RAD51D, RNF43, SDHB, SDHC, SDHD, SMAD4, SMARCA4. STK11, TP53, TSC1, TSC2, and VHL.  The following genes were evaluated for sequence changes only: SDHA and HOXB13 c.251G>A variant only. The report date is January 29, 2019.   03/01/2019 Surgery   RE-EXCISION OF LEFT BREAST LUMPECTOMY MULITPLE MARGINS by Dr. [name]  Diagnosis 1. Breast, excision, re-excision left anterior margin - BENIGN BREAST PARENCHYMA WITH PREVIOUS PROCEDURE-RELATED CHANGES - NEGATIVE FOR IN SITU OR INVASIVE CARCINOMA 2. 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 SEE NOTE - DCIS IS FOCALLY LESS THAN 1 MM FROM NEW INFERIOR MARGIN - NEGATIVE FOR INVASIVE CARCINOMA - PREVIOUS PROCEDURE-RELATED CHANGES 3. Breast, excision, re-excision left medial margin - FOCAL DUCTAL CARCINOMA IN SITU (DCIS), 0.2 CM. SEE NOTE - DCIS IS 1.5 MM FROM NEW MEDIAL MARGIN - NEGATIVE FOR INVASIVE CARCINOMA - PREVIOUS PROCEDURE-RELATED CHANGES 4. Breast, excision, re-excision left final medial argin - BENIGN BREAST PARENCHYMA WITH MILD FOCAL PREVIOUS PROCEDURE-RELATED CHANGES - NEGATIVE FOR IN SITU OR INVASIVE CARCINOMA    03/22/2019 - 05/24/2019 Chemotherapy   TC q3weeks for 4 cycles  starting 03/22/19 and completed on 05/24/19    06/24/2019 - 08/09/2019 Radiation Therapy   Radiation therapy with Dr. Lisbeth Renshaw 06/24/19-08/09/19   08/2019 -  Anti-estrogen oral therapy   Letrozole 2.65m starting 08/2019   10/23/2019 Survivorship   SCP delivered by LCira Rue NP      INTERVAL HISTORY:  Ms. MEdingerto review her survivorship care plan detailing her treatment course for breast cancer, as well as monitoring long-term side effects of that treatment, education regarding health maintenance, screening, and overall wellness and health promotion.     Ms. MConryis doing well. She continues to strengthen her legs, tries to exercise. She remains out of bed and functional at home. Taking gabapentin 300 mg at night for neuropathy which is helping. She has some sinus drainage and cough with clear phlegm, no fever, chills, chest pain, dyspnea. Appetite is specific, but eats when she is hungry. She has normal BM. Denies rectal or vaginal bleeding. Denies bone or joint pain. Denies hot flash. After surgery her left breast was smaller, now it seems larger. No lump/mass, no nipple discharge or inversion. Has stable intermittent pains since surgery. PAC was removed by Dr. IDalbert Batman12/30/20, she has not seen her new surgeon yet.     ONCOLOGY TREATMENT TEAM:  1. [facility] 2. Medical Oncologist: Dr. FBurr Medico 3. Radiation Oncologist: Dr. MLisbeth Renshaw   PAST MEDICAL/SURGICAL HISTORY:  Past Medical History:  Diagnosis Date  . Anxiety   . Arthritis  right hand  . Bronchitis   . Cancer ([place]) 11/2018   left breast ca  . Depression   . Diabetes mellitus without complication ([place])   . Family history of breast cancer   . GERD (gastroesophageal reflux disease)   . Headache   . Heart murmur   . Herpes 1&2   . Hypertension   . Leg fracture, left    as a child  . OSA on CPAP    has CPAP but has not used in over a month   Past Surgical History:  Procedure  Laterality Date  . ABDOMINAL HYSTERECTOMY    . BREAST LUMPECTOMY WITH RADIOACTIVE SEED AND SENTINEL LYMPH NODE BIOPSY Left 01/08/2019   Procedure: LEFT BREAST LUMPECTOMY WITH RADIOACTIVE SEED AND LEFT AXILLARY DEEP SENTINEL LYMPH NODE BIOPSY WITH BLUE DYE INJECTION;  Surgeon: Fanny Skates, MD;  Location: [place];  Service: General;  Laterality: Left;  . COLONOSCOPY N/A 09/02/2015   Procedure: COLONOSCOPY;  Surgeon: Danie Binder, MD;  [facility];  Service: Endoscopy;  Laterality: N/A;  11:15 Am  . EYE SURGERY Right    cataract  . HAND SURGERY Right    broken bone  . PORT-A-CATH REMOVAL Right 08/21/2019   Procedure: REMOVAL PORT-A-CATH;  Surgeon: Fanny Skates, MD;  Location: [place];  Service: General;  Laterality: Right;  . PORTACATH PLACEMENT N/A 03/01/2019   Procedure: INSERTION PORT-A-CATH;  Surgeon: Fanny Skates, MD;  Location: [place];  Service: General;  Laterality: N/A;  . RE-EXCISION OF BREAST LUMPECTOMY Left 03/01/2019   Procedure: RE-EXCISION OF LEFT BREAST LUMPECTOMY MULITPLE MARGINS;  Surgeon: Fanny Skates, MD;  Location: [place];  Service: General;  Laterality: Left;  . TUBAL LIGATION       ALLERGIES:  Allergies  Allergen Reactions  . Asa [Aspirin] Hives  . Nsaids Hives  . Penicillins     Tolerated Ancef 01/08/2019 Has patient had a PCN reaction causing immediate rash, facial/tongue/throat swelling, SOB or lightheadedness with hypotension: Yesyes Has patient had a PCN reaction causing severe rash involving mucus membranes or skin necrosis: Nono Has patient had a PCN reaction that required hospitalization Nono Has patient had a PCN reaction occurring within the last 10 years: Nono If all of the above answers are "NO", then may proceed with Cephalosporin     CURRENT MEDICATIONS:  Outpatient Encounter Medications as of 10/23/2019  Medication Sig  . amLODipine (NORVASC) 5 MG tablet Take 5 mg by mouth daily.  . Ascorbic  Acid (VITAMIN C) 1000 MG tablet Take 1,000 mg by mouth daily.  Marland Kitchen atorvastatin (LIPITOR) 20 MG tablet Take 20 mg by mouth daily.  . Cholecalciferol (VITAMIN D) 50 MCG (2000 UT) CAPS Take 2,000 Units by mouth daily.  Marland Kitchen gabapentin (NEURONTIN) 100 MG capsule TAKE 1 CAPSULE BY MOUTH 3  TIMES DAILY  . lactulose (CHRONULAC) 10 GM/15ML solution Take 15 mLs (10 g total) by mouth 2 (two) times daily.  Marland Kitchen letrozole (FEMARA) 2.5 MG tablet Take 1 tablet (2.5 mg total) by mouth daily.  Marland Kitchen lisinopril (ZESTRIL) 2.5 MG tablet Take 2.5 mg by mouth at bedtime.  . metFORMIN (GLUMETZA) 500 MG (MOD) 24 hr tablet Take 1,500 mg by mouth daily with breakfast.   . montelukast (SINGULAIR) 10 MG tablet Take 10 mg by mouth at bedtime.  Marland Kitchen omeprazole (PRILOSEC) 20 MG capsule Take 20 mg by mouth daily.  Marland Kitchen PARoxetine (PAXIL) 10 MG tablet Take 10 mg by mouth at bedtime.  . potassium chloride (  MICRO-K) 10 MEQ CR capsule Take 10 mEq by mouth 2 (two) times daily.  Marland Kitchen tiZANidine (ZANAFLEX) 4 MG capsule Take 4 mg by mouth every 8 (eight) hours.   . topiramate (TOPAMAX) 100 MG tablet Take 100 mg by mouth daily.  . vitamin B-12 (CYANOCOBALAMIN) 500 MCG tablet Take 500 mcg by mouth daily.   Marland Kitchen acyclovir (ZOVIRAX) 400 MG tablet Take 400 mg by mouth 2 (two) times daily.  . fluticasone (FLONASE) 50 MCG/ACT nasal spray Place 2 sprays into both nostrils daily.   . folic acid (FOLVITE) 518 MCG tablet Take 400 mcg by mouth daily.   Marland Kitchen HYDROcodone-acetaminophen (NORCO) 5-325 MG tablet Take 1-2 tablets by mouth every 6 (six) hours as needed.  . lidocaine-prilocaine (EMLA) cream Apply to affected area once  . loratadine (CLARITIN) 10 MG tablet Take 10 mg by mouth daily.  . ondansetron (ZOFRAN) 8 MG tablet Take 1 tablet (8 mg total) by mouth 2 (two) times daily as needed for refractory nausea / vomiting. Start on day 3 after chemo. (Patient taking differently: Take 8 mg by mouth 2 (two) times daily as needed for refractory nausea / vomiting. Start on  day 3 after chemo.)  . prochlorperazine (COMPAZINE) 10 MG tablet Take 1 tablet (10 mg total) by mouth every 6 (six) hours as needed (Nausea or vomiting).   No facility-administered encounter medications on file as of 10/23/2019.     ONCOLOGIC FAMILY HISTORY:  Family History  Problem Relation Age of Onset  . Transient ischemic attack Father   . Heart attack Father   . Stroke Brother   . Breast cancer Maternal Aunt 84  . Breast cancer Cousin        mat first cousin  . Breast cancer Cousin        mat first cousin  . Cancer Cousin        mat first cousin     GENETIC COUNSELING/TESTING: Yes, Negative on 01/29/19  SOCIAL HISTORY:  Social History   Socioeconomic History  . Marital status: Married    Spouse name: Not on file  . Number of children: Not on file  . Years of education: Not on file  . Highest education level: Not on file  Occupational History  . Not on file  Tobacco Use  . Smoking status: Current Every Day Smoker    Packs/day: 0.25    Years: 10.00    Pack years: 2.50    Types: Cigarettes  . Smokeless tobacco: Never Used  . Tobacco comment: 1-2 cigarette per day  Substance and Sexual Activity  . Alcohol use: No  . Drug use: No  . Sexual activity: Not on file  Other Topics Concern  . Not on file  Social History Narrative  . Not on file   Social Determinants of Health   Financial Resource Strain:   . Difficulty of Paying Living Expenses: Not on file  Food Insecurity:   . Worried About Charity fundraiser in the Last Year: Not on file  . Ran Out of Food in the Last Year: Not on file  Transportation Needs: No Transportation Needs  . Lack of Transportation (Medical): No  . Lack of Transportation (Non-Medical): No  Physical Activity:   . Days of Exercise per Week: Not on file  . Minutes of Exercise per Session: Not on file  Stress:   . Feeling of Stress : Not on file  Social Connections:   . Frequency of Communication with Friends and Family: Not  on file   . Frequency of Social Gatherings with Friends and Family: Not on file  . Attends Religious Services: Not on file  . Active Member of Clubs or Organizations: Not on file  . Attends Archivist Meetings: Not on file  . Marital Status: Not on file  Intimate Partner Violence:   . Fear of Current or Ex-Partner: Not on file  . Emotionally Abused: Not on file  . Physically Abused: Not on file  . Sexually Abused: Not on file     OBSERVATIONS/OBJECTIVE:  Patient appears well on video screen, speech is clear and intact. Mood and affect appear normal. No cough or conversational dyspnea.   LABORATORY DATA:  None for this visit.  DIAGNOSTIC IMAGING:  None for this visit.      ASSESSMENT AND PLAN:  Ms.. Emma Casey is a pleasant 59 y.o. female with Stage IA left breast invasive ductal carcinoma, ER+/PR+/HER2-, diagnosed in 11/2018, treated with lumpectomy, adjuvant chemotherapy, radiation therapy, and anti-estrogen therapy with letrozole in 08/2019.  She presents to the Survivorship Clinic for our initial meeting and routine follow-up post-completion of treatment for breast cancer.    1. Stage IA left breast cancer:  Ms. Karam has recovered well from definitive treatment for breast cancer. She will follow-up with her medical oncologist, Dr. Burr Medico in 01/2020 with history and physical exam per surveillance protocol.  She will continue her anti-estrogen therapy with letrozole. Thus far, she is tolerating it well without significant toxicities. She was instructed to make Dr. Burr Medico or myself aware if she begins to experience any worsening side effects of the medication and I could see her back in clinic to help manage those side effects, as needed. Her mammogram is due in 11/2019; orders placed today.  Her breast density is category b. Today, a comprehensive survivorship care plan and treatment summary was reviewed with the patient today detailing her breast cancer diagnosis, treatment course,  potential late/long-term effects of treatment, appropriate follow-up care with recommendations for the future, and patient education resources.  A copy of this summary, along with a letter will be sent to the patient's primary care provider via In Basket message after today's visit.    2. Lymphedema: She has what sounds like swelling in her left breast with intermittent pain, this is likely related to surgeries and radiation. She does not palpate a mass, no nipple change. I am recommending a f/u in the next few weeks for a breast exam and possible referral to PT. She plans to see new surgeon in 12/2019.   3. Bone health:  Given Ms. Kell's age/history of breast cancer and her current treatment regimen including anti-estrogen therapy with letrozole, she is at risk for bone demineralization.  I am recommending baseline DEXA in 11/2019 when she goes in for her mammogram. In the meantime, she was encouraged to add calcium to her daily vitamin D supplement as well as increase her weight-bearing activities.  She was given education on specific activities to promote bone health.  4. Cancer screening:  Due to Ms. Porta's history and her age, she should receive screening for skin cancers and colon cancer. , and gynecologic cancers.  The information and recommendations are listed on the patient's comprehensive care plan/treatment summary and were reviewed in detail with the patient.    5. Health maintenance and wellness promotion: Ms. Lagan was encouraged to consume 5-7 servings of fruits and vegetables per day. She plans to start the "egg diet" which sounds like high protein, low  carb diet which I feel is reasonable for her. We reviewed the "Nutrition Rainbow" handout, as well as the handout "Take Control of Your Health and Reduce Your Cancer Risk" from the [employer].  She was also encouraged to engage in safe and appropriately vigorous exercise for her for 30 minutes per day most days of the  week. We discussed the [employer] fitness program, which is designed for cancer survivors to help them become more physically fit after cancer treatments.  She was instructed to limit her alcohol consumption. She restarted smoking after her cancer diagnosis and was strongly encouraged to stop smoking. I recommend nicotine replacement products to help her quit, she will try.     6. Support services/counseling: It is not uncommon for this period of the patient's cancer care trajectory to be one of many emotions and stressors.  We discussed how this can be increasingly difficult during the times of quarantine and social distancing due to the COVID-19 pandemic.   She was given information regarding our available services and encouraged to contact me with any questions or for help enrolling in any of our support group/programs.   Orders Placed This Encounter  Procedures  . MM DIAG BREAST TOMO BILATERAL    Standing Status:   Future    Standing Expiration Date:   10/22/2020    Scheduling Instructions:     [facility] [place], [place]    Order Specific Question:   Reason for Exam (SYMPTOM  OR DIAGNOSIS REQUIRED)    Answer:   h/o left breast cancer s/p lumpectomy, chemo, RT, AI    Order Specific Question:   Is the patient pregnant?    Answer:   No    Order Specific Question:   Preferred imaging location?    Answer:   External  . DG Bone Density    Standing Status:   Future    Standing Expiration Date:   10/22/2020    Scheduling Instructions:     [facility]    Order Specific Question:   Reason for Exam (SYMPTOM  OR DIAGNOSIS REQUIRED)    Answer:   h/o breast cancer on AI, smoker, h/o chemo risk factors    Order Specific Question:   Is the patient pregnant?    Answer:   No    Order Specific Question:   Preferred imaging location?    Answer:   External    Follow up instructions:    -Return to cancer center in 1-4 weeks for breast exam, anticipate PT referral for lymphedema   -Mammogram and DEXA due in 11/2019 at the [facility] center in [place], per pt request  -Follow up with surgery 12/2019 -She is welcome to return back to the Survivorship Clinic at any time; no additional follow-up needed at this time.  -Consider referral back to survivorship as a long-term survivor for continued surveillance  The patient was provided an opportunity to ask questions and all were answered. The patient agreed with the plan and demonstrated an understanding of the instructions.  The patient was advised to call back or seek an in-person evaluation if the symptoms worsen or if the condition fails to improve as anticipated.   I provided 33 minutes of face-to-face time via Mychart video message during this encounter.   Alla Feeling, NP

## 2019-10-24 ENCOUNTER — Telehealth: Payer: Self-pay | Admitting: Nurse Practitioner

## 2019-10-24 NOTE — Telephone Encounter (Signed)
Scheduled appt per 3/3 los.  Spoke with patient and she is aware of the appt date and time.

## 2019-11-20 ENCOUNTER — Encounter: Payer: Self-pay | Admitting: *Deleted

## 2019-11-25 ENCOUNTER — Other Ambulatory Visit: Payer: Self-pay

## 2019-11-25 NOTE — Progress Notes (Signed)
[place]   [phone] [phone]   Clinic Follow up Note   Patient Care Team: Leeanne Rio, MD as PCP - General (Family Medicine) Mauro Kaufmann, RN as Oncology Nurse Navigator Rockwell Germany, RN as Oncology Nurse Navigator Fanny Skates, MD as Consulting Physician (General Surgery) Truitt Merle, MD as Consulting Physician (Hematology) Kyung Rudd, MD as Consulting Physician (Radiation Oncology) Alla Feeling, NP as Nurse Practitioner (Nurse Practitioner) 11/26/2019  CHIEF COMPLAINT: F/u left breast cancer, left breast swelling   SUMMARY OF ONCOLOGIC HISTORY: Oncology History Overview Note  Cancer Staging Malignant neoplasm of upper-inner quadrant of left breast in female, estrogen receptor positive ([place]) Staging form: Breast, AJCC 8th Edition - Clinical stage from 12/17/2018: Stage Unknown (cTX, cN0, cM0, G2, ER+, PR+, HER2-) - Signed by Truitt Merle, MD on 12/25/2018 - Pathologic stage from 01/08/2019: Stage IA (pT1c, pN0, cM0, G3, ER+, PR+, HER2-) - Signed by Truitt Merle, MD on 01/22/2019     Malignant neoplasm of upper-inner quadrant of left breast in female, estrogen receptor positive ([place])  11/28/2018 Mammogram   Diagnostic Mammogram 11/28/18 IMPRESSION:  Suspcious mass in upper inner quadrant of the left breast  at posterior depth.    12/17/2018 Initial Biopsy   Diagnosis 12/17/18 Breast, left, needle core biopsy, upper inner quadrant - INVASIVE DUCTAL CARCINOMA, SEE COMMENT. - DUCTAL CARCINOMA IN SITU.   12/17/2018 Cancer Staging   Staging form: Breast, AJCC 8th Edition - Clinical stage from 12/17/2018: Stage Unknown (cTX, cN0, cM0, G2, ER+, PR+, HER2-) - Signed by Truitt Merle, MD on 12/25/2018   12/20/2018 Initial Diagnosis   Malignant neoplasm of upper-inner quadrant of left breast in female, estrogen receptor positive ([place])   12/20/2018 Receptors her2   Results: IMMUNOHISTOCHEMICAL AND MORPHOMETRIC ANALYSIS PERFORMED MANUALLY The tumor  cells are NEGATIVE for Her2 (1+). Estrogen Receptor: 95%, POSITIVE, STRONG STAINING INTENSITY Progesterone Receptor: 30%, POSITIVE, STRONG STAINING INTENSITY Proliferation Marker Ki67: 20%   01/08/2019 Surgery   LEFT BREAST LUMPECTOMY WITH RADIOACTIVE SEED AND LEFT AXILLARY DEEP SENTINEL LYMPH NODE BIOPSY WITH BLUE DYE INJECTION by Dr [name] 01/08/19    01/08/2019 Pathology Results   Diagnosis 01/08/19 1. Breast, lumpectomy, left - INVASIVE DUCTAL CARCINOMA, GRADE III/III, SPANNING 1.8 CM. - DUCTAL CARCINOMA IN SITU, HIGH GRADE. - DUCTAL CARCINOMA IN SITU IS FOCALLY PRESENT AT THE ANTERIOR MARGIN OF SPECIMEN #1. - DUCTAL CARCINOMA IN SITU IS FOCALLY LESS THAN 0.1 CM FROM THE INFERIOR MARGIN OF SPECIMEN # 1. - SEE ONCOLOGY TABLE BELOW. 2. Breast, excision, left medial margin - DUCTAL CARCINOMA IN SITU, FOCALLY PRESENT AT THE MEDIAL MARGIN OF SPECIMEN # 2. 3. Breast, excision, left posterior margin - BENIGN BREAST PARENCHYMA. - THERE IS NO EVIDENCE OF MALIGNANCY. - SEE COMMENT. 4. Lymph node, sentinel, biopsy, left axillary - THERE IS NO EVIDENCE OF CARCINOMA IN 1 OF 1 LYMPH NODE (0/1). 5. Lymph node, sentinel, biopsy, left axillary - THERE IS NO EVIDENCE OF CARCINOMA IN 1 OF 1 LYMPH NODE (0/1). 6. Lymph node, sentinel, biopsy, left axillary - THERE IS NO EVIDENCE OF CARCINOMA IN 1 OF 1 LYMPH NODE (0/1)   01/08/2019 Oncotype testing   recurrence score of 27 with risk of recurrence 16% with Tamoxifen or AI alone. There is a benefit of chemotherapy >15%    01/08/2019 Cancer Staging   Staging form: Breast, AJCC 8th Edition - Pathologic stage from 01/08/2019: Stage IA (pT1c, pN0, cM0, G3, ER+, PR+, HER2-) - Signed by Truitt Merle, MD on 01/22/2019  01/29/2019 Genetic Testing   Negative genetic testing on the common hereditary cancer panel.  The Common Hereditary Gene Panel offered by [facility] includes sequencing and/or deletion duplication testing of the following 48 genes: APC, ATM, AXIN2, BARD1,  BMPR1A, BRCA1, BRCA2, BRIP1, CDH1, CDK4, CDKN2A (p14ARF), CDKN2A (p16INK4a), CHEK2, CTNNA1, DICER1, EPCAM (Deletion/duplication testing only), GREM1 (promoter region deletion/duplication testing only), KIT, MEN1, MLH1, MSH2, MSH3, MSH6, MUTYH, NBN, NF1, NHTL1, PALB2, PDGFRA, PMS2, POLD1, POLE, PTEN, RAD50, RAD51C, RAD51D, RNF43, SDHB, SDHC, SDHD, SMAD4, SMARCA4. STK11, TP53, TSC1, TSC2, and VHL.  The following genes were evaluated for sequence changes only: SDHA and HOXB13 c.251G>A variant only. The report date is January 29, 2019.   03/01/2019 Surgery   RE-EXCISION OF LEFT BREAST LUMPECTOMY MULITPLE MARGINS by Dr. [name]  Diagnosis 1. Breast, excision, re-excision left anterior margin - BENIGN BREAST PARENCHYMA WITH PREVIOUS PROCEDURE-RELATED CHANGES - NEGATIVE FOR IN SITU OR INVASIVE CARCINOMA 2. 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 SEE NOTE - DCIS IS FOCALLY LESS THAN 1 MM FROM NEW INFERIOR MARGIN - NEGATIVE FOR INVASIVE CARCINOMA - PREVIOUS PROCEDURE-RELATED CHANGES 3. Breast, excision, re-excision left medial margin - FOCAL DUCTAL CARCINOMA IN SITU (DCIS), 0.2 CM. SEE NOTE - DCIS IS 1.5 MM FROM NEW MEDIAL MARGIN - NEGATIVE FOR INVASIVE CARCINOMA - PREVIOUS PROCEDURE-RELATED CHANGES 4. Breast, excision, re-excision left final medial argin - BENIGN BREAST PARENCHYMA WITH MILD FOCAL PREVIOUS PROCEDURE-RELATED CHANGES - NEGATIVE FOR IN SITU OR INVASIVE CARCINOMA    03/22/2019 - 05/24/2019 Chemotherapy   TC q3weeks for 4 cycles starting 03/22/19 and completed on 05/24/19    06/24/2019 - 08/09/2019 Radiation Therapy   Radiation therapy with Dr. Lisbeth Renshaw 06/24/19-08/09/19   08/2019 -  Anti-estrogen oral therapy   Letrozole 2.48m starting 08/2019   10/23/2019 Survivorship   SCP delivered by LCira Rue NP      CURRENT THERAPY: letrozole once daily, starting 08/2019   INTERVAL HISTORY: Ms. MMcilrathreturns for follow up as scheduled.  She had a virtual SCP visit with me on 10/23/19 where she reported her left breast appeared larger than normal. She also notes "pins sticking me" sensation in the left breast. Denies new lump/mass, nipple discharge or inversion, or skin change. Skin remains dark from radiation. She otherwise feels the same. She continues to have weakness in her legs she attributes to chemo, before treatment she had normal strength and function. She wonders if she can have a lift chair. Otherwise, energy and appetite are stable. Bowels moving normally. Denies new bone/joint pain. Denies new abdominal pain. She has second covid vaccine coming up.  MEDICAL HISTORY:  Past Medical History:  Diagnosis Date  . Anxiety   . Arthritis    right hand  . Bronchitis   . [place] ([place]   left breast ca  . Depression   . [place] mellitus without complication ([place]   . Family history of breast cancer   . GERD (gastroesophageal reflux disease)   . Headache   . Heart murmur   . Herpes 1&2   . Hypertension   . Leg fracture, left    as a child  . OSA on CPAP    has CPAP but has not used in over a month    SURGICAL HISTORY: Past Surgical History:  Procedure Laterality Date  . ABDOMINAL HYSTERECTOMY    . BREAST LUMPECTOMY WITH RADIOACTIVE SEED AND SENTINEL LYMPH NODE BIOPSY Left 01/08/2019   Procedure: LEFT BREAST LUMPECTOMY WITH RADIOACTIVE SEED  AND LEFT AXILLARY DEEP SENTINEL LYMPH NODE BIOPSY WITH BLUE DYE INJECTION;  Surgeon: Fanny Skates, MD;  Location: [facility];  Service: General;  Laterality: Left;  . COLONOSCOPY N/A 09/02/2015   Procedure: COLONOSCOPY;  Surgeon: Danie Binder, MD;  [facility];  Service: Endoscopy;  Laterality: N/A;  11:15 Am  . EYE SURGERY Right    cataract  . HAND SURGERY Right    broken bone  . PORT-A-CATH REMOVAL Right 08/21/2019   Procedure: REMOVAL PORT-A-CATH;  Surgeon: Fanny Skates, MD;  Location: [facility];  Service: General;   Laterality: Right;  . PORTACATH PLACEMENT N/A 03/01/2019   Procedure: INSERTION PORT-A-CATH;  Surgeon: Fanny Skates, MD;  Location: [facility];  Service: General;  Laterality: N/A;  . RE-EXCISION OF BREAST LUMPECTOMY Left 03/01/2019   Procedure: RE-EXCISION OF LEFT BREAST LUMPECTOMY MULITPLE MARGINS;  Surgeon: Fanny Skates, MD;  Location: [facility];  Service: General;  Laterality: Left;  . TUBAL LIGATION      I have reviewed the social history and family history with the patient and they are unchanged from previous note.  ALLERGIES:  is allergic to asa [aspirin]; nsaids; and penicillins.  MEDICATIONS:  Current Outpatient Medications  Medication Sig Dispense Refill  . acyclovir (ZOVIRAX) 400 MG tablet Take 400 mg by mouth 2 (two) times daily.    Marland Kitchen amLODipine (NORVASC) 5 MG tablet Take 5 mg by mouth daily.    . Ascorbic Acid (VITAMIN C) 1000 MG tablet Take 1,000 mg by mouth daily.    Marland Kitchen atorvastatin (LIPITOR) 20 MG tablet Take 20 mg by mouth daily.    . Cholecalciferol (VITAMIN D) 50 MCG (2000 UT) CAPS Take 2,000 Units by mouth daily.    . fluticasone (FLONASE) 50 MCG/ACT nasal spray Place 2 sprays into both nostrils daily.     . folic acid (FOLVITE) 546 MCG tablet Take 400 mcg by mouth daily.     Marland Kitchen gabapentin (NEURONTIN) 100 MG capsule TAKE 1 CAPSULE BY MOUTH 3  TIMES DAILY 90 capsule 11  . lactulose (CHRONULAC) 10 GM/15ML solution Take 15 mLs (10 g total) by mouth 2 (two) times daily. 236 mL 0  . letrozole (FEMARA) 2.5 MG tablet Take 1 tablet (2.5 mg total) by mouth daily. 90 tablet 3  . lidocaine-prilocaine (EMLA) cream Apply to affected area once 30 g 3  . lisinopril (ZESTRIL) 2.5 MG tablet Take 2.5 mg by mouth at bedtime.    Marland Kitchen loratadine (CLARITIN) 10 MG tablet Take 10 mg by mouth daily.    . metFORMIN (GLUCOPHAGE-XR) 500 MG 24 hr tablet     . montelukast (SINGULAIR) 10 MG tablet Take 10 mg by mouth at bedtime.    Marland Kitchen omeprazole (PRILOSEC) 20 MG capsule Take 20 mg by mouth daily.    .  ondansetron (ZOFRAN) 8 MG tablet Take 1 tablet (8 mg total) by mouth 2 (two) times daily as needed for refractory nausea / vomiting. Start on day 3 after chemo. (Patient taking differently: Take 8 mg by mouth 2 (two) times daily as needed for refractory nausea / vomiting. Start on day 3 after chemo.) 30 tablet 1  . PARoxetine (PAXIL) 10 MG tablet Take 10 mg by mouth at bedtime.    . potassium chloride (MICRO-K) 10 MEQ CR capsule Take 10 mEq by mouth 2 (two) times daily.    . prochlorperazine (COMPAZINE) 10 MG tablet Take 1 tablet (10 mg total) by mouth every 6 (six) hours as needed (Nausea or  vomiting). 30 tablet 1  . tiZANidine (ZANAFLEX) 4 MG tablet Take 4 mg by mouth 3 (three) times daily.    Marland Kitchen topiramate (TOPAMAX) 100 MG tablet Take 100 mg by mouth daily.    . vitamin B-12 (CYANOCOBALAMIN) 500 MCG tablet Take 500 mcg by mouth daily.      No current facility-administered medications for this visit.    PHYSICAL EXAMINATION:  Vitals:   11/26/19 0844  BP: (!) 158/86   Filed Weights   11/26/19 0844  Weight: 257 lb 6.4 oz (116.8 kg)    GENERAL:alert, no distress and comfortable SKIN: no rash  EYES: sclera clear LYMPH:  no palpable cervical or supraclavicular lymphadenopathy  LUNGS: normal breathing effort HEART:  no lower extremity edema ABDOMEN:abdomen soft, non-tender and normal bowel sounds NEURO: alert & oriented x 3 with fluent speech Breast exam: s/p left lumpectomy and radiation, incisions completely healed. Whole breast is dry with hyperpigmentation and mild edema. No palpable mass in either breast or axilla that I could appreciate, no nipple discharge or inversion bilaterally. Left breast slightly smaller than right.   LABORATORY DATA:  I have reviewed the data as listed CBC Latest Ref Rng & Units 07/24/2019 05/24/2019 05/03/2019  WBC 4.0 - 10.5 K/uL 4.9 8.1 9.3  Hemoglobin 12.0 - 15.0 g/dL 11.7(L) 10.6(L) 11.3(L)  Hematocrit 36.0 - 46.0 % 37.6 33.8(L) 37.2  Platelets 150 -  400 K/uL 268 361 272     CMP Latest Ref Rng & Units 07/24/2019 05/24/2019 05/03/2019  Glucose 70 - 99 mg/dL 208(H) 183(H) 221(H)  BUN 6 - 20 mg/dL 9 11 13   Creatinine 0.44 - 1.00 mg/dL 1.08(H) 1.05(H) 1.25(H)  Sodium 135 - 145 mmol/L 142 139 139  Potassium 3.5 - 5.1 mmol/L 3.3(L) 3.6 3.9  Chloride 98 - 111 mmol/L 105 107 108  CO2 22 - 32 mmol/L 28 26 24   Calcium 8.9 - 10.3 mg/dL 9.0 8.7(L) 8.9  Total Protein 6.5 - 8.1 g/dL 6.1(L) 5.4(L) 5.8(L)  Total Bilirubin 0.3 - 1.2 mg/dL 0.7 0.8 0.8  Alkaline Phos 38 - 126 U/L 59 55 64  AST 15 - 41 U/L 9(L) 9(L) 11(L)  ALT 0 - 44 U/L 8 8 9       RADIOGRAPHIC STUDIES: I have personally reviewed the radiological images as listed and agreed with the findings in the report. No results found.   ASSESSMENT & PLAN: Briani Maul is a 59 y.o. female with   1.Malignant neoplasm of upper-inner quadrant of left breast,pT1cN0,stage I,ER/PR+,HER2-, GradeIII, Oncotype RS 27 -She was diagnosed in 11/2018. She underwentleftbreastlumpectomyand SLN biopsyon 01/08/19 and re-excision for positive margins on 03/01/19.Oncotype showed RS 27, high risk.  -To reduce her risk of recurrence she completed 4 cycles of TC in 05/2019.  -To reduce risk for local recurrence, she completed adjuvant Radiation with Dr. Lisbeth Renshaw on 08/09/2019   -Given the strong ER and PR positivity, she began adjuvant aromatase inhibitor to reduce her risk of cancer recurrence with Letrozole for 7-10 years.  -Ms. Olsson is doing well. She continues letrozole which she is tolerating well. Breast exam today shows mild breast lymphedema, no clinical concern for recurrence. She also has what sounds like neuropathic pain in the left breast.  -I am referring her for PT. I also recommend to increase gabapentin to 100 mg AM and continue 300 mg PM -She is due annual mammogram this month, she will call the [facility] in [place] to schedule -F/u in 01/2020 as planned   2. B/l leg pain,  neuropathy  of feet, G1 -started after chemo. Likely secondary to chemo  -ambulates with walker, still some residual weakness and neuropathy. Taking gabapentin 300 mg qHS -I am referring her to PT for lower extremity weakness  -will hold off on lift chair until PT eval.   3. HTN, DMwith hyperglycemia -continue medication regimen and f/u with PCP and other providers   4.CKD stage III -secondary to DM and HTN, f/u PCP -monitoring   5. Genetic Testingwasnegativefor pathogenetic mutations -Patient recently found out other than her maternal aunt, 3 of her maternal first cousins have breast cancer as well.   PLAN: -No clinical concern for recurrence  -Referral to PT for left breast lymphedema and lower extremity weakness  -DME/lift chair pending PT eval -Increase gabapentin, now 100 mg AM and continue 300 mg qHS  -Refilled letrozole, continue -Patient will call [facility] in [place] to schedule routine mammogram this month -Next routine f/u in 01/2020 with Dr. Burr Medico, patient prefers to keep this appt as is    No problem-specific Assessment & Plan notes found for this encounter.   Orders Placed This Encounter  Procedures  . Ambulatory referral to Physical Therapy    Referral Priority:   Routine    Referral Type:   Physical Medicine    Referral Reason:   Specialty Services Required    Requested Specialty:   Physical Therapy   All questions were answered. The patient knows to call the clinic with any problems, questions or concerns. No barriers to learning was detected.    Alla Feeling, NP 11/26/19

## 2019-11-26 ENCOUNTER — Other Ambulatory Visit: Payer: Self-pay

## 2019-11-26 ENCOUNTER — Encounter: Payer: Self-pay | Admitting: Nurse Practitioner

## 2019-11-26 ENCOUNTER — Telehealth: Payer: Self-pay | Admitting: *Deleted

## 2019-11-26 ENCOUNTER — Inpatient Hospital Stay: Payer: Medicare Other | Attending: Hematology | Admitting: Nurse Practitioner

## 2019-11-26 VITALS — BP 158/86 | Ht 66.0 in | Wt 257.4 lb

## 2019-11-26 DIAGNOSIS — Z803 Family history of malignant neoplasm of breast: Secondary | ICD-10-CM | POA: Diagnosis not present

## 2019-11-26 DIAGNOSIS — Z923 Personal history of irradiation: Secondary | ICD-10-CM | POA: Insufficient documentation

## 2019-11-26 DIAGNOSIS — I890 Lymphedema, not elsewhere classified: Secondary | ICD-10-CM | POA: Insufficient documentation

## 2019-11-26 DIAGNOSIS — Z7984 Long term (current) use of oral hypoglycemic drugs: Secondary | ICD-10-CM | POA: Diagnosis not present

## 2019-11-26 DIAGNOSIS — N183 Chronic kidney disease, stage 3 unspecified: Secondary | ICD-10-CM | POA: Insufficient documentation

## 2019-11-26 DIAGNOSIS — Z170 Estrogen receptor positive status [ER+]: Secondary | ICD-10-CM | POA: Diagnosis not present

## 2019-11-26 DIAGNOSIS — Z79811 Long term (current) use of aromatase inhibitors: Secondary | ICD-10-CM | POA: Diagnosis not present

## 2019-11-26 DIAGNOSIS — Z90710 Acquired absence of both cervix and uterus: Secondary | ICD-10-CM | POA: Diagnosis not present

## 2019-11-26 DIAGNOSIS — F329 Major depressive disorder, single episode, unspecified: Secondary | ICD-10-CM | POA: Diagnosis not present

## 2019-11-26 DIAGNOSIS — C50212 Malignant neoplasm of upper-inner quadrant of left female breast: Secondary | ICD-10-CM | POA: Diagnosis present

## 2019-11-26 DIAGNOSIS — Z9221 Personal history of antineoplastic chemotherapy: Secondary | ICD-10-CM | POA: Diagnosis not present

## 2019-11-26 DIAGNOSIS — G629 Polyneuropathy, unspecified: Secondary | ICD-10-CM | POA: Insufficient documentation

## 2019-11-26 DIAGNOSIS — Z79899 Other long term (current) drug therapy: Secondary | ICD-10-CM | POA: Diagnosis not present

## 2019-11-26 DIAGNOSIS — E119 Type 2 diabetes mellitus without complications: Secondary | ICD-10-CM | POA: Insufficient documentation

## 2019-11-26 DIAGNOSIS — F419 Anxiety disorder, unspecified: Secondary | ICD-10-CM | POA: Diagnosis not present

## 2019-11-26 DIAGNOSIS — I10 Essential (primary) hypertension: Secondary | ICD-10-CM | POA: Insufficient documentation

## 2019-11-26 MED ORDER — LETROZOLE 2.5 MG PO TABS: 2.5000 mg | ORAL_TABLET | Freq: Every day | ORAL | 3 refills | Status: DC

## 2019-11-26 NOTE — Telephone Encounter (Signed)
Mammogram and bone density orders faxed over to [facility] at [phone]

## 2019-11-29 ENCOUNTER — Ambulatory Visit: Payer: Medicare Other | Attending: Nurse Practitioner

## 2019-11-29 ENCOUNTER — Other Ambulatory Visit: Payer: Self-pay

## 2019-11-29 DIAGNOSIS — Z170 Estrogen receptor positive status [ER+]: Secondary | ICD-10-CM | POA: Insufficient documentation

## 2019-11-29 DIAGNOSIS — M25612 Stiffness of left shoulder, not elsewhere classified: Secondary | ICD-10-CM

## 2019-11-29 DIAGNOSIS — M25611 Stiffness of right shoulder, not elsewhere classified: Secondary | ICD-10-CM | POA: Diagnosis present

## 2019-11-29 DIAGNOSIS — M25512 Pain in left shoulder: Secondary | ICD-10-CM | POA: Insufficient documentation

## 2019-11-29 DIAGNOSIS — C50212 Malignant neoplasm of upper-inner quadrant of left female breast: Secondary | ICD-10-CM | POA: Diagnosis not present

## 2019-11-29 DIAGNOSIS — I890 Lymphedema, not elsewhere classified: Secondary | ICD-10-CM | POA: Diagnosis present

## 2019-11-29 NOTE — Therapy (Signed)
[place], [place], [address] Phone: [phone]   Fax:  [phone]  Physical Therapy Evaluation  Patient Details  Name: Emma [facility] MRN: [id number] Date of Birth: Sep 22, 1960 Referring Provider (PT): Koleen Distance NP   Encounter Date: 11/29/2019  PT End of Session - 11/29/19 0940    Visit Number  1    Number of Visits  13    Date for PT Re-Evaluation  01/17/20    PT Start Time  0904    Activity Tolerance  Patient tolerated treatment well    Behavior During Therapy  [facility] for tasks assessed/performed       Past Medical History:  Diagnosis Date  . Anxiety   . Arthritis    right hand  . Bronchitis   . Cancer ([place]) 11/2018   left breast ca  . Depression   . Diabetes mellitus without complication ([place])   . Family history of breast cancer   . GERD (gastroesophageal reflux disease)   . Headache   . Heart murmur   . Herpes 1&2   . Hypertension   . Leg fracture, left    as a child  . OSA on CPAP    has CPAP but has not used in over a month    Past Surgical History:  Procedure Laterality Date  . ABDOMINAL HYSTERECTOMY    . BREAST LUMPECTOMY WITH RADIOACTIVE SEED AND SENTINEL LYMPH NODE BIOPSY Left 01/08/2019   Procedure: LEFT BREAST LUMPECTOMY WITH RADIOACTIVE SEED AND LEFT AXILLARY DEEP SENTINEL LYMPH NODE BIOPSY WITH BLUE DYE INJECTION;  Surgeon: Fanny Skates, MD;  Location: [place];  Service: General;  Laterality: Left;  . COLONOSCOPY N/A 09/02/2015   Procedure: COLONOSCOPY;  Surgeon: Danie Binder, MD;  [facility];  Service: Endoscopy;  Laterality: N/A;  11:15 Am  . EYE SURGERY Right    cataract  . HAND SURGERY Right    broken bone  . PORT-A-CATH REMOVAL Right 08/21/2019   Procedure: REMOVAL PORT-A-CATH;  Surgeon: Fanny Skates, MD;  Location: [place];  Service: General;  Laterality: Right;  . PORTACATH PLACEMENT N/A 03/01/2019   Procedure: INSERTION PORT-A-CATH;  Surgeon: Fanny Skates, MD;  Location: [place];  Service: General;  Laterality: N/A;  . RE-EXCISION OF BREAST LUMPECTOMY Left 03/01/2019   Procedure: RE-EXCISION OF LEFT BREAST LUMPECTOMY MULITPLE MARGINS;  Surgeon: Fanny Skates, MD;  Location: [place];  Service: General;  Laterality: Left;  . TUBAL LIGATION      There were no vitals filed for this visit.   Subjective Assessment - 11/29/19 0906    Subjective  Pt states that she has the most difficulty lifing her L arm especially to perform daily activities such as putting on her deodorant. The most pain is at her incision sites. She states she also gets a pins and needle sensation from her L breast to her R breast and can tell that her L breast is larger than her R breast.    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 Chemotherapy and radiation were performed.    Patient Stated Goals  I want to decrease my pain and be able to move my arm better.    Currently in Pain?  Yes    Pain Score  3    pt states sometimes when pins and  needles happen her pain is 10 or greater and it can wake her up at night.   Pain Location  Breast    Pain Orientation  Left    Pain Descriptors / Indicators  Aching    Pain Type  Surgical pain    Pain Onset  More than a month ago    Pain Frequency  Constant    Aggravating Factors   unknown    Pain Relieving Factors  medication    Effect of Pain on Daily Activities  pain with ADL's         [facility] PT Assessment - 11/29/19 0001      Assessment   Medical Diagnosis  L breast cancer    Referring Provider (PT)  Koleen Distance NP    Onset Date/Surgical Date  01/08/19    Hand Dominance  Right    Next MD Visit  --   In June    Prior Therapy  None      Precautions   Precautions  Other (comment)    Precaution Comments  Hx  cancer, risk for lymphedema      Restrictions   Weight Bearing Restrictions  No      Balance Screen   Has the patient fallen in the past 6 months  No    Has the patient had a decrease in activity level because of a fear of falling?   Yes    Is the patient reluctant to leave their home because of a fear of falling?   No      Home Environment   Living Environment  Private residence    Living Arrangements  Alone    Type of [place] to enter    Entrance Stairs-Number of Steps  22    Entrance Stairs-Rails  Can reach both      Prior Function   Level of Independence  Independent    Vocation  On disability    Leisure  Puzzles      Cognition   Overall Cognitive Status  Within Functional Limits for tasks assessed      Sensation   Light Touch  Appears Intact      Coordination   Gross Motor Movements are Fluid and Coordinated  Yes      Posture/Postural Control   Posture/Postural Control  Postural limitations    Postural Limitations  Rounded Shoulders;Forward head      ROM / Strength   AROM / PROM / Strength  AROM      AROM   AROM Assessment Site  Shoulder    Right/Left Shoulder  Right;Left    Right Shoulder Flexion  127 Degrees    Right Shoulder ABduction  86 Degrees    Left Shoulder Flexion  105 Degrees    Left Shoulder ABduction  75 Degrees        LYMPHEDEMA/ONCOLOGY QUESTIONNAIRE - 11/29/19 0929      Type   Cancer Type  L breast cancer      Surgeries   Lumpectomy Date  01/08/19    Sentinel Lymph Node Biopsy Date  01/08/19    Other Surgery Date  03/01/19    Number Lymph Nodes Removed  1      Treatment   Active Chemotherapy Treatment  No    Past Chemotherapy Treatment  Yes    Active Radiation Treatment  No    Past Radiation Treatment  Yes    Body Site  L breast  and axilla    Current Hormone Treatment  Yes    Drug Name  Femara      What other symptoms do you have   Are you Having Heaviness or Tightness  Yes    Are you having  Pain  Yes    Are you having pitting edema  No    Is it Hard or Difficult finding clothes that fit  No    Do you have infections  No    Is there Decreased scar mobility  Yes      Lymphedema Assessments   Lymphedema Assessments  Upper extremities      Right Upper Extremity Lymphedema   15 cm Proximal to Olecranon Process  42.1 cm    10 cm Proximal to Olecranon Process  41.7 cm    Olecranon Process  31 cm    15 cm Proximal to Ulnar Styloid Process  26.2 cm    10 cm Proximal to Ulnar Styloid Process  21.2 cm    Just Proximal to Ulnar Styloid Process  16 cm    Across Hand at [employer]  18.9 cm    At [place] of 2nd Digit  6.3 cm      Left Upper Extremity Lymphedema   15 cm Proximal to Olecranon Process  44.4 cm    10 cm Proximal to Olecranon Process  41.9 cm    Olecranon Process  30.2 cm    15 cm Proximal to Ulnar Styloid Process  26 cm    10 cm Proximal to Ulnar Styloid Process  21.9 cm    Just Proximal to Ulnar Styloid Process  17 cm    Across Hand at [employer]  19.2 cm    At [place] of 2nd Digit  6.3 cm    Other  at nipple line 119 cm              Objective measurements completed on examination: See above findings.      [facility] Adult PT Treatment/Exercise - 11/29/19 0001      Exercises   Exercises  Other Exercises    Other Exercises   scapular retraction, wall slide into flexion and abduction with demonstration and return demonstration.              PT Education - 11/29/19 [phone]    Education Details  Educated on HEP. Pt was educated on lymphedema including treatment incluing complete decongestive therapy that includes exercise, manual lymph drainage, skin care and compression. Pt was educated on risk reduction practices including avoiding extreme temperatures, needle sticks, blood pressures, restrictive clothing. Discussed a possible compression bra and some type of compressive sleeve.    Person(s) Educated  Patient    Methods   Explanation;Handout;Demonstration    Comprehension  Verbalized understanding;Returned demonstration       PT Short Term Goals - 11/29/19 0953      PT SHORT TERM GOAL #1   Title  Pt will be independent with HEP and MLD within 3 weeks in order to demonstrate autonomy of care.    Baseline  Pt currently just received her HEP and does not know how to perform MLD    Time  3    Period  Weeks    Status  New    Target Date  12/27/19        PT Long Term Goals - 11/29/19 0954      PT LONG TERM GOAL #1   Title  Patient to be  properly fitted with compression garment to wear on daily basis.    Baseline  Pt currently does not have any compression garments.    Time  6    Period  Weeks    Status  New    Target Date  01/17/20      PT LONG TERM GOAL #2   Title  Patient will have reduction of limb girth by 1 cm at proximal brachium and 0.5 cm at the L styloid process.    Baseline  see measurements    Time  6    Period  Weeks    Status  New    Target Date  01/17/20      PT LONG TERM GOAL #3   Title  Patient will be independent in prevention/self-care management principles including self-massage and compression and long term management plan for edema.    Baseline  pt currently does not know how to manage edema    Time  6    Period  Weeks    Status  New    Target Date  01/17/20      PT LONG TERM GOAL #4   Title  Pt will demonstrate 120 degrees L shoulder flexion and abduction in order to improve functional ROM.    Baseline  L shoulder flexion 105, abduction 75    Time  6    Period  Weeks    Status  New    Target Date  01/10/20      PT LONG TERM GOAL #5   Title  Pt will report 50% improvement in pain and function in the L arm within 6 weeks to demonstrate improved quality of life.    Baseline  10/10 pain occasionally    Time  6    Period  Weeks    Status  New    Target Date  01/17/20             Plan - 11/29/19 0941    Clinical Impression Statement  Pt presents to  physical therapy with decreased ROM in bil shoulders with the L significantly worse than the R. She is demonstratig increased circumferential measurements in her proximal L brachium and distal antebrachium with concerns for stage I lymphedema. Pt is post lumpectomy and radiation on the L side and discussed risk reduction precautions for fluid build up. Pt was measured today by [facility] for a compression vest and sleeve and discussed POC with pt including teaching MLD, wearing compression and performing exercises in order to improve L shoulder ROM and decrease scarring/fluid. Pt will benefit from skilled physical therapy services in order to decrease risk for immobility due to scarring and decrease risk of infection due to edema.    Personal Factors and Comorbidities  Comorbidity 2;Social Background    Comorbidities  L lumpectomy with radiation, multiple previous joint aches/pains, decreased shoulder ROM on the R shoulder as well    Stability/Clinical Decision Making  Stable/Uncomplicated    Clinical Decision Making  Low    Rehab Potential  Good    PT Frequency  2x / week    PT Duration  6 weeks    PT Treatment/Interventions  Moist Heat;Iontophoresis 69m/ml Dexamethasone;Neuromuscular re-education;Patient/family education;Manual techniques;Therapeutic exercise;Therapeutic activities    PT Next Visit Plan  begin teaching self MLD, perform MLD, myofascial release, AA/ROM exercises    PT Home Exercise Plan  Access Code: [email or web address]   Recommended Other Services  Pt measured by [facility] today 11/29/2019. possible vasopneumatic pump, compression bra  vs. vest and compression sleeve velcro vs. sleeve vs. edema wear    Consulted and Agree with Plan of Care  Patient       Patient will benefit from skilled therapeutic intervention in order to improve the following deficits and impairments:  Increased edema, Decreased range of motion, Decreased scar mobility, Decreased knowledge of precautions, Increased fascial  restricitons, Pain  Visit Diagnosis: Malignant neoplasm of upper-inner quadrant of left breast in female, estrogen receptor positive ([place]) - Plan: PT plan of care cert/re-cert  Lymphedema, not elsewhere classified - Plan: PT plan of care cert/re-cert  Acute pain of left shoulder - Plan: PT plan of care cert/re-cert  Stiffness of left shoulder, not elsewhere classified - Plan: PT plan of care cert/re-cert  Stiffness of right shoulder, not elsewhere classified - Plan: PT plan of care cert/re-cert     Problem List Patient Active Problem List   Diagnosis Date Noted  . Morbid (severe) obesity due to excess calories ([place]) 11/25/2019  . Port-A-Cath in place 03/22/2019  . Chemotherapy-induced fatigue 03/22/2019  . Genetic testing 01/29/2019  . Family history of breast cancer   . Infiltrating ductal carcinoma of left breast ([place]) 01/03/2019  . Malignant neoplasm of upper-inner quadrant of left breast in female, estrogen receptor positive ([place]) 12/20/2018  . Essential hypertension 10/02/2017  . Gastroesophageal reflux disease without esophagitis 10/02/2017  . Hyperlipidemia 10/02/2017  . OSA (obstructive sleep apnea) 10/02/2017  . Type 2 diabetes mellitus with complication, without long-term current use of insulin ([place]) 10/02/2017  . Special screening for malignant neoplasms, colon     Ander Purpura, PT 11/29/2019, 11:10 AM  [place], [place], [address] Phone: [phone]   Fax:  [phone]  Name: [facility] MRN: [id number] Date of Birth: 1961-01-09

## 2019-11-29 NOTE — Patient Instructions (Signed)
Access Code: [email or web address] URL: [email or web address] Date: 11/29/2019 Prepared by: Tomma Rakers  Exercises Shoulder Flexion Wall Slide with Towel - 3 x daily - 7 x weekly - 1 sets - 10 reps Standing Shoulder Abduction Slides at Wall - 3 x daily - 7 x weekly - 1 sets - 10 reps Seated Scapular Retraction - 3 x daily - 7 x weekly - 1 sets - 10 reps

## 2019-12-09 ENCOUNTER — Telehealth: Payer: Self-pay

## 2019-12-09 NOTE — Telephone Encounter (Signed)
Fax received from [facility]. Signed by Cira Rue NP and sent back to Fax# [phone]

## 2019-12-09 NOTE — Telephone Encounter (Signed)
Received voicemail from [place] at [facility] [phone] ext. [phone]) about pt. She stated that she needed to fax over an order for a bilateral breat [place] limited of both breast to be signed by NP or Doctor for patient. Waiting for fax now...  Her fax # [phone]

## 2019-12-10 ENCOUNTER — Other Ambulatory Visit: Payer: Self-pay

## 2019-12-10 ENCOUNTER — Ambulatory Visit: Payer: Medicare Other

## 2019-12-10 DIAGNOSIS — C50212 Malignant neoplasm of upper-inner quadrant of left female breast: Secondary | ICD-10-CM | POA: Diagnosis not present

## 2019-12-10 DIAGNOSIS — I890 Lymphedema, not elsewhere classified: Secondary | ICD-10-CM

## 2019-12-10 DIAGNOSIS — M25611 Stiffness of right shoulder, not elsewhere classified: Secondary | ICD-10-CM

## 2019-12-10 DIAGNOSIS — M25512 Pain in left shoulder: Secondary | ICD-10-CM

## 2019-12-10 DIAGNOSIS — M25612 Stiffness of left shoulder, not elsewhere classified: Secondary | ICD-10-CM

## 2019-12-10 NOTE — Therapy (Signed)
[place], [place], [address] Phone: [phone]   Fax:  [phone]  Physical Therapy Treatment  Patient Details  Name: Emma [facility] MRN: [id number] Date of Birth: Dec 22, 1960 Referring Provider (PT): Koleen Distance NP   Encounter Date: 12/10/2019  PT End of Session - 12/10/19 1053    Visit Number  2    Number of Visits  13    Date for PT Re-Evaluation  01/17/20    PT Start Time  1005    PT Stop Time  1101    PT Time Calculation (min)  56 min    Activity Tolerance  Patient tolerated treatment well    Behavior During Therapy  [facility] for tasks assessed/performed       Past Medical History:  Diagnosis Date  . Anxiety   . Arthritis    right hand  . Bronchitis   . Cancer ([place]) 11/2018   left breast ca  . Depression   . Diabetes mellitus without complication ([place])   . Family history of breast cancer   . GERD (gastroesophageal reflux disease)   . Headache   . Heart murmur   . Herpes 1&2   . Hypertension   . Leg fracture, left    as a child  . OSA on CPAP    has CPAP but has not used in over a month    Past Surgical History:  Procedure Laterality Date  . ABDOMINAL HYSTERECTOMY    . BREAST LUMPECTOMY WITH RADIOACTIVE SEED AND SENTINEL LYMPH NODE BIOPSY Left 01/08/2019   Procedure: LEFT BREAST LUMPECTOMY WITH RADIOACTIVE SEED AND LEFT AXILLARY DEEP SENTINEL LYMPH NODE BIOPSY WITH BLUE DYE INJECTION;  Surgeon: Fanny Skates, MD;  Location: [place];  Service: General;  Laterality: Left;  . COLONOSCOPY N/A 09/02/2015   Procedure: COLONOSCOPY;  Surgeon: Danie Binder, MD;  [facility];  Service: Endoscopy;  Laterality: N/A;  11:15 Am  . EYE SURGERY Right    cataract  . HAND SURGERY Right    broken bone  . PORT-A-CATH REMOVAL Right 08/21/2019   Procedure: REMOVAL PORT-A-CATH;  Surgeon: Fanny Skates, MD;  Location: [place];  Service: General;   Laterality: Right;  . PORTACATH PLACEMENT N/A 03/01/2019   Procedure: INSERTION PORT-A-CATH;  Surgeon: Fanny Skates, MD;  Location: [place];  Service: General;  Laterality: N/A;  . RE-EXCISION OF BREAST LUMPECTOMY Left 03/01/2019   Procedure: RE-EXCISION OF LEFT BREAST LUMPECTOMY MULITPLE MARGINS;  Surgeon: Fanny Skates, MD;  Location: [place];  Service: General;  Laterality: Left;  . TUBAL LIGATION      There were no vitals filed for this visit.  Subjective Assessment - 12/10/19 1008    Subjective  I've been trying to do the exercises she gave me last time and they hurt sometimes but I'm doing them. Also been trying to use my arm more when I empty my dishwasher. It's a struggle but I can do it.    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 Chemotherapy and radiation were performed.    Patient Stated Goals  I want to decrease my pain and be able to move my arm better.    Currently in Pain?  Yes    Pain Score  3     Pain Location  Chest    Pain  Orientation  Left    Pain Descriptors / Indicators  Dull    Pain Type  Surgical pain    Pain Radiating Towards  across chest    Pain Onset  More than a month ago    Pain Frequency  Intermittent    Aggravating Factors   just comes and goes    Pain Relieving Factors  muscle relaxer and gabapentin                       [facility] Adult PT Treatment/Exercise - 12/10/19 0001      Shoulder Exercises: Seated   Retraction  AROM;Both;10 reps    Retraction Limitations  Demo and cuing for correct techique    Other Seated Exercises  Backward shoulder rolls encouraging pt to incorporate these throughout day along with scap retract      Shoulder Exercises: Pulleys   Flexion  2 minutes    Flexion Limitations  Returning therapist demo and VCs for decrease scapular compensation     ABduction  2 minutes    ABduction Limitations  Returning therapist demo and demo to decrease scapular compensations      Manual Therapy   Manual Therapy  Manual Lymphatic Drainage (MLD);Passive ROM;Myofascial release    Myofascial Release  To Lt axilla during P/ROM, cross hands technique at chest wall horizontally    Manual Lymphatic Drainage (MLD)  In Supine with HOB slightly elevated: Short neck, bil shoulder collectors, superficial and deep abdominals, Lt inguinal and Rt axillary nodes, Lt axillo-inguinal and anterior inter-axillary anastomosis then focused on Lt UE working from lateral upper arm to dorsal hand proximal to distal then retracing all steps    Passive ROM  To Le shoulder into flexion, abduction and D2 to tolerance               PT Short Term Goals - 11/29/19 0953      PT SHORT TERM GOAL #1   Title  Pt will be independent with HEP and MLD within 3 weeks in order to demonstrate autonomy of care.    Baseline  Pt currently just received her HEP and does not know how to perform MLD    Time  3    Period  Weeks    Status  New    Target Date  12/27/19        PT Long Term Goals - 11/29/19 0954      PT LONG TERM GOAL #1   Title  Patient to be properly fitted with compression garment to wear on daily basis.    Baseline  Pt currently does not have any compression garments.    Time  6    Period  Weeks    Status  New    Target Date  01/17/20      PT LONG TERM GOAL #2   Title  Patient will have reduction of limb girth by 1 cm at proximal brachium and 0.5 cm at the L styloid process.    Baseline  see measurements    Time  6    Period  Weeks    Status  New    Target Date  01/17/20      PT LONG TERM GOAL #3   Title  Patient will be independent in prevention/self-care management principles including self-massage and compression and long term management plan for edema.    Baseline  pt currently does not know how to manage edema    Time  6  Period  Weeks    Status   New    Target Date  01/17/20      PT LONG TERM GOAL #4   Title  Pt will demonstrate 120 degrees L shoulder flexion and abduction in order to improve functional ROM.    Baseline  L shoulder flexion 105, abduction 75    Time  6    Period  Weeks    Status  New    Target Date  01/10/20      PT LONG TERM GOAL #5   Title  Pt will report 50% improvement in pain and function in the L arm within 6 weeks to demonstrate improved quality of life.    Baseline  10/10 pain occasionally    Time  6    Period  Weeks    Status  New    Target Date  01/17/20            Plan - 12/10/19 1059    Clinical Impression Statement  Today focused on manual lymph drainage beginning to instruct pt while performing. She was able to verbalize good understanding. Also began gentle P/ROM of Lt shoulder. She tolerated this well but did require multiple VCs for relaxtaion thorughout due to guarded muscles. Added pulleys for AA/ROM at end of session and though required VCs and demo initially to decrease bil scapular compensation, she was able to return good demo after a few reps. Ended session with backward shoulder rolls and scapular retraction explaining importance of incorporating these into her day to better improve her posture.    Personal Factors and Comorbidities  Comorbidity 2;Social Background    Comorbidities  L lumpectomy with radiation, multiple previous joint aches/pains, decreased shoulder ROM on the R shoulder as well    Stability/Clinical Decision Making  Stable/Uncomplicated    Rehab Potential  Good    PT Frequency  2x / week    PT Duration  6 weeks    PT Treatment/Interventions  Moist Heat;Iontophoresis 18m/ml Dexamethasone;Neuromuscular re-education;Patient/family education;Manual techniques;Therapeutic exercise;Therapeutic activities    PT Next Visit Plan  begin teaching self MLD having pt return dmeo while performing, cont myofascial release and gentle P/ROM of Lt shoulder, AA/ROM exercises    PT  Home Exercise Plan  Access Code: [email or web address]    Consulted and Agree with Plan of Care  Patient       Patient will benefit from skilled therapeutic intervention in order to improve the following deficits and impairments:  Increased edema, Decreased range of motion, Decreased scar mobility, Decreased knowledge of precautions, Increased fascial restricitons, Pain  Visit Diagnosis: Malignant neoplasm of upper-inner quadrant of left breast in female, estrogen receptor positive ([facility])  Lymphedema, not elsewhere classified  Acute pain of left shoulder  Stiffness of left shoulder, not elsewhere classified  Stiffness of right shoulder, not elsewhere classified     Problem List Patient Active Problem List   Diagnosis Date Noted  . Morbid (severe) obesity due to excess calories ([place] 11/25/2019  . Port-A-Cath in place 03/22/2019  . Chemotherapy-induced fatigue 03/22/2019  . Genetic testing 01/29/2019  . Family history of breast cancer   . Infiltrating ductal carcinoma of left breast ([place] 01/03/2019  . Malignant neoplasm of upper-inner quadrant of left breast in female, estrogen receptor positive ([place] 12/20/2018  . Essential hypertension 10/02/2017  . Gastroesophageal reflux disease without esophagitis 10/02/2017  . Hyperlipidemia 10/02/2017  . OSA (obstructive sleep apnea) 10/02/2017  . Type 2 diabetes mellitus with complication, without long-term current use of  insulin ([place]) 10/02/2017  . Special screening for malignant neoplasms, colon     Otelia Limes, PTA 12/10/2019, 11:08 AM  [place], [place], [address] Phone: [phone]   Fax:  [phone]  Name: [facility] MRN: [id number] Date of Birth: 06-Jun-1961

## 2019-12-12 ENCOUNTER — Ambulatory Visit: Payer: Medicare Other

## 2019-12-12 ENCOUNTER — Other Ambulatory Visit: Payer: Self-pay

## 2019-12-12 DIAGNOSIS — M25612 Stiffness of left shoulder, not elsewhere classified: Secondary | ICD-10-CM

## 2019-12-12 DIAGNOSIS — C50212 Malignant neoplasm of upper-inner quadrant of left female breast: Secondary | ICD-10-CM | POA: Diagnosis not present

## 2019-12-12 DIAGNOSIS — M25512 Pain in left shoulder: Secondary | ICD-10-CM

## 2019-12-12 DIAGNOSIS — I890 Lymphedema, not elsewhere classified: Secondary | ICD-10-CM

## 2019-12-12 DIAGNOSIS — M25611 Stiffness of right shoulder, not elsewhere classified: Secondary | ICD-10-CM

## 2019-12-12 NOTE — Patient Instructions (Signed)
Start with circles near neck with hands above collarbones, 10 times each side  Cancer Rehab [phone]    Deep Effective Breath   Standing, sitting, or laying down, place both hands on the belly. Take a deep breath IN, expanding the belly; then breath OUT, contracting the belly. Repeat __5__ times. Do __2-3__ sessions per day and before your self massage.   Axilla to Axilla - Pump   On uninvolved side make 5 circles in the armpit, then pump _5__ times from involved armpit across chest to uninvolved armpit, making a pathway. Do _1__ time per day.  Copyright  VHI. All rights reserved.  Axilla to Inguinal Nodes - Pump   On involved side, make 5 circles at groin at panty line, then pump _5__ times from armpit along side of trunk to outer hip, making your other pathway. Do __1_ time per day.  Copyright  VHI. All rights reserved.  Arm Posterior: Elbow to Shoulder - Sweep   Pump _5__ times from back of elbow to top of shoulder. Then inner to outer upper arm _5_ times, then outer arm again _5_ times. Then back to the pathways _2-3_ times. Do _1__ time per day.  Copyright  VHI. All rights reserved.  ARM: [place]olar Wrist to Elbow - Sweep   Pump or stationary circles _5__ times from wrist to elbow making sure to do both sides of the forearm. Then retrace your steps to the outer arm, and the pathways _2-3_ times each. Do _1__ time per day.  Copyright  VHI. All rights reserved.  ARM: Dorsum of Hand to Shoulder - Sweep   Pump or stationary circles _5__ times on back of hand including knuckle spaces and individual fingers if needed working up towards the wrist, then retrace all your steps working back up the forearm, doing both sides; upper outer arm and back to your pathways _2-3_ times each. Then do 5 circles again at uninvolved armpit and involved groin where you started! Good job!! Do __1_ time per day.

## 2019-12-12 NOTE — Therapy (Signed)
[place], [place], [address] Phone: [phone]   Fax:  [phone]  Physical Therapy Treatment  Patient Details  Name: Emma Casey MRN: [id number] Date of Birth: [birth year] Referring Provider (PT): Koleen Distance NP   Encounter Date: 12/12/2019  PT End of Session - 12/12/19 1106    Visit Number  3    Number of Visits  13    Date for PT Re-Evaluation  01/17/20    PT Start Time  1009    PT Stop Time  1107    PT Time Calculation (min)  58 min    Activity Tolerance  Patient tolerated treatment well    Behavior During Therapy  [facility] for tasks assessed/performed       Past Medical History:  Diagnosis Date  . Anxiety   . Arthritis    right hand  . Bronchitis   . Cancer ([place]) 11/2018   left breast ca  . Depression   . Diabetes mellitus without complication ([place])   . Family history of breast cancer   . GERD (gastroesophageal reflux disease)   . Headache   . Heart murmur   . Herpes 1&2   . Hypertension   . Leg fracture, left    as a child  . OSA on CPAP    has CPAP but has not used in over a month    Past Surgical History:  Procedure Laterality Date  . ABDOMINAL HYSTERECTOMY    . BREAST LUMPECTOMY WITH RADIOACTIVE SEED AND SENTINEL LYMPH NODE BIOPSY Left 01/08/2019   Procedure: LEFT BREAST LUMPECTOMY WITH RADIOACTIVE SEED AND LEFT AXILLARY DEEP SENTINEL LYMPH NODE BIOPSY WITH BLUE DYE INJECTION;  Surgeon: Fanny Skates, MD;  Location: [place];  Service: General;  Laterality: Left;  . COLONOSCOPY N/A 09/02/2015   Procedure: COLONOSCOPY;  Surgeon: Danie Binder, MD;  [facility];  Service: Endoscopy;  Laterality: N/A;  11:15 Am  . EYE SURGERY Right    cataract  . HAND SURGERY Right    broken bone  . PORT-A-CATH REMOVAL Right 08/21/2019   Procedure: REMOVAL PORT-A-CATH;  Surgeon: Fanny Skates, MD;  Location: [facility];  Service: General;   Laterality: Right;  . PORTACATH PLACEMENT N/A 03/01/2019   Procedure: INSERTION PORT-A-CATH;  Surgeon: Fanny Skates, MD;  Location: [place];  Service: General;  Laterality: N/A;  . RE-EXCISION OF BREAST LUMPECTOMY Left 03/01/2019   Procedure: RE-EXCISION OF LEFT BREAST LUMPECTOMY MULITPLE MARGINS;  Surgeon: Fanny Skates, MD;  Location: [facility];  Service: General;  Laterality: Left;  . TUBAL LIGATION      There were no vitals filed for this visit.  Subjective Assessment - 12/12/19 1015    Subjective  I was told I was going to get to have a different mammogram yesterday but they still did the regular one so my Lt breast is hurting so much today. I was really upset by that.    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 Chemotherapy and radiation were performed.    Patient Stated Goals  I want to decrease my pain and be able to move my arm better.    Currently in Pain?  Yes    Pain Score  --   3.5   Pain Location  Breast    Pain Orientation  Left  Pain Descriptors / Indicators  Dull    Pain Type  Surgical pain    Pain Onset  More than a month ago    Pain Frequency  Intermittent    Aggravating Factors   right now hurting from mammogram yesterday    Pain Relieving Factors  pain meds and massage today                       [facility] Adult PT Treatment/Exercise - 12/12/19 0001      Manual Therapy   Manual Therapy  Manual Lymphatic Drainage (MLD);Passive ROM;Myofascial release    Myofascial Release  To Lt axilla during P/ROM, cross hands technique at chest wall horizontally    Manual Lymphatic Drainage (MLD)  In Supine with HOB slightly elevated: Short neck, bil shoulder collectors, superficial and deep abdominals, Lt inguinal and Rt axillary nodes, Lt axillo-inguinal and anterior inter-axillary anastomosis then  focused on Lt UE working from lateral upper arm to dorsal hand proximal to distal then retracing all steps and included breast today as well as pt reports increased tenderness since mammogram yesterday    Passive ROM  To Lt shoulder into flexion, abduction and D2 to tolerance               PT Short Term Goals - 11/29/19 0953      PT SHORT TERM GOAL #1   Title  Pt will be independent with HEP and MLD within 3 weeks in order to demonstrate autonomy of care.    Baseline  Pt currently just received her HEP and does not know how to perform MLD    Time  3    Period  Weeks    Status  New    Target Date  12/27/19        PT Long Term Goals - 11/29/19 0954      PT LONG TERM GOAL #1   Title  Patient to be properly fitted with compression garment to wear on daily basis.    Baseline  Pt currently does not have any compression garments.    Time  6    Period  Weeks    Status  New    Target Date  01/17/20      PT LONG TERM GOAL #2   Title  Patient will have reduction of limb girth by 1 cm at proximal brachium and 0.5 cm at the L styloid process.    Baseline  see measurements    Time  6    Period  Weeks    Status  New    Target Date  01/17/20      PT LONG TERM GOAL #3   Title  Patient will be independent in prevention/self-care management principles including self-massage and compression and long term management plan for edema.    Baseline  pt currently does not know how to manage edema    Time  6    Period  Weeks    Status  New    Target Date  01/17/20      PT LONG TERM GOAL #4   Title  Pt will demonstrate 120 degrees L shoulder flexion and abduction in order to improve functional ROM.    Baseline  L shoulder flexion 105, abduction 75    Time  6    Period  Weeks    Status  New    Target Date  01/10/20      PT LONG TERM GOAL #5     Title  Pt will report 50% improvement in pain and function in the L arm within 6 weeks to demonstrate improved quality of life.    Baseline   10/10 pain occasionally    Time  6    Period  Weeks    Status  New    Target Date  01/17/20            Plan - 12/12/19 1107    Clinical Impression Statement  Instructed pt in manual lymph drainage today having her return demonstration. VCs and hand over hand pressure for lighter pressure and to stretch skin, not slide. She was able to return good demo after cuing. Issued handout for same. Pt reports breast feeling better at end of session today. She was agreeable to signing [employer] form for garments so faxed that to [employer].    Personal Factors and Comorbidities  Comorbidity 2;Social Background    Comorbidities  L lumpectomy with radiation, multiple previous joint aches/pains, decreased shoulder ROM on the R shoulder as well    Stability/Clinical Decision Making  Stable/Uncomplicated    Rehab Potential  Good    PT Frequency  2x / week    PT Duration  6 weeks    PT Treatment/Interventions  Moist Heat;Iontophoresis 4mg/ml Dexamethasone;Neuromuscular re-education;Patient/family education;Manual techniques;Therapeutic exercise;Therapeutic activities    PT Next Visit Plan  Review self MLD having pt return demo while performing, cont myofascial release and gentle P/ROM of Lt shoulder, AA/ROM exercises and progress HEP, try [place]wood?    Consulted and Agree with Plan of Care  Patient       Patient will benefit from skilled therapeutic intervention in order to improve the following deficits and impairments:  Increased edema, Decreased range of motion, Decreased scar mobility, Decreased knowledge of precautions, Increased fascial restricitons, Pain  Visit Diagnosis: Malignant neoplasm of upper-inner quadrant of left breast in female, estrogen receptor positive (HCC)  Lymphedema, not elsewhere classified  Acute pain of left shoulder  Stiffness of left shoulder, not elsewhere classified  Stiffness of right shoulder, not elsewhere classified     Problem List Patient Active Problem List    Diagnosis Date Noted  . Morbid (severe) obesity due to excess calories (HCC) 11/25/2019  . Port-A-Cath in place 03/22/2019  . Chemotherapy-induced fatigue 03/22/2019  . Genetic testing 01/29/2019  . Family history of breast cancer   . Infiltrating ductal carcinoma of left breast (HCC) 01/03/2019  . Malignant neoplasm of upper-inner quadrant of left breast in female, estrogen receptor positive (HCC) 12/20/2018  . Essential hypertension 10/02/2017  . Gastroesophageal reflux disease without esophagitis 10/02/2017  . Hyperlipidemia 10/02/2017  . OSA (obstructive sleep apnea) 10/02/2017  . Type 2 diabetes mellitus with complication, without long-term current use of insulin (HCC) 10/02/2017  . Special screening for malignant neoplasms, colon     Rosenberger, Valerie Ann, PTA 12/12/2019, 12:56 PM  [facility] [address] [place], [place], [address] Phone: [phone]   Fax:  [phone]  Name: Melaina Kleinschmidt MRN: [id number] Date of Birth: 03/05/1961   

## 2019-12-19 ENCOUNTER — Ambulatory Visit: Payer: Medicare Other

## 2019-12-19 ENCOUNTER — Other Ambulatory Visit: Payer: Self-pay

## 2019-12-19 DIAGNOSIS — C50212 Malignant neoplasm of upper-inner quadrant of left female breast: Secondary | ICD-10-CM

## 2019-12-19 DIAGNOSIS — I890 Lymphedema, not elsewhere classified: Secondary | ICD-10-CM

## 2019-12-19 DIAGNOSIS — M25612 Stiffness of left shoulder, not elsewhere classified: Secondary | ICD-10-CM

## 2019-12-19 DIAGNOSIS — M25512 Pain in left shoulder: Secondary | ICD-10-CM

## 2019-12-19 DIAGNOSIS — M25611 Stiffness of right shoulder, not elsewhere classified: Secondary | ICD-10-CM

## 2019-12-19 NOTE — Therapy (Signed)
[place], [place], [address] Phone: [phone]   Fax:  [phone]  Physical Therapy Treatment  Patient Details  Name: Emma Casey MRN: [id number] Date of Birth: Nov 19, 1960 Referring Provider (PT): Koleen Distance NP   Encounter Date: 12/19/2019  PT End of Session - 12/19/19 1105    Visit Number  4    Number of Visits  13    Date for PT Re-Evaluation  01/17/20    PT Start Time  1006    PT Stop Time  1106    PT Time Calculation (min)  60 min    Activity Tolerance  Patient tolerated treatment well    Behavior During Therapy  [facility] for tasks assessed/performed       Past Medical History:  Diagnosis Date  . Anxiety   . Arthritis    right hand  . Bronchitis   . Cancer ([place]) 11/2018   left breast ca  . Depression   . Diabetes mellitus without complication ([place])   . Family history of breast cancer   . GERD (gastroesophageal reflux disease)   . Headache   . Heart murmur   . Herpes 1&2   . Hypertension   . Leg fracture, left    as a child  . OSA on CPAP    has CPAP but has not used in over a month    Past Surgical History:  Procedure Laterality Date  . ABDOMINAL HYSTERECTOMY    . BREAST LUMPECTOMY WITH RADIOACTIVE SEED AND SENTINEL LYMPH NODE BIOPSY Left 01/08/2019   Procedure: LEFT BREAST LUMPECTOMY WITH RADIOACTIVE SEED AND LEFT AXILLARY DEEP SENTINEL LYMPH NODE BIOPSY WITH BLUE DYE INJECTION;  Surgeon: Fanny Skates, MD;  Location: [place];  Service: General;  Laterality: Left;  . COLONOSCOPY N/A 09/02/2015   Procedure: COLONOSCOPY;  Surgeon: Danie Binder, MD;  [facility];  Service: Endoscopy;  Laterality: N/A;  11:15 Am  . EYE SURGERY Right    cataract  . HAND SURGERY Right    broken bone  . PORT-A-CATH REMOVAL Right 08/21/2019   Procedure: REMOVAL PORT-A-CATH;  Surgeon: Fanny Skates, MD;  Location: [facility];  Service: General;   Laterality: Right;  . PORTACATH PLACEMENT N/A 03/01/2019   Procedure: INSERTION PORT-A-CATH;  Surgeon: Fanny Skates, MD;  Location: [place];  Service: General;  Laterality: N/A;  . RE-EXCISION OF BREAST LUMPECTOMY Left 03/01/2019   Procedure: RE-EXCISION OF LEFT BREAST LUMPECTOMY MULITPLE MARGINS;  Surgeon: Fanny Skates, MD;  Location: [place];  Service: General;  Laterality: Left;  . TUBAL LIGATION      There were no vitals filed for this visit.  Subjective Assessment - 12/19/19 1010    Subjective  My breast felt better after the last session from being flared up from the mammogram, the massage helped. I've been having some shooting pains from my armpit to my nipple.    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 Chemotherapy and radiation were performed.    Patient Stated Goals  I want to decrease my pain and be able to move my arm better.    Currently in Pain?  Yes    Pain Score  3    3.5   Pain Location  Axilla    Pain Orientation  Left    Pain Descriptors /  Indicators  Shooting    Pain Type  Surgical pain    Pain Radiating Towards  towards nipple    Pain Onset  More than a month ago    Pain Frequency  Intermittent    Aggravating Factors   they just come and go    Pain Relieving Factors  hot washcloth or ice pack, pain meds                       [facility] Adult PT Treatment/Exercise - 12/19/19 0001      Shoulder Exercises: Standing   External Rotation  Strengthening;Left;5 reps;Theraband    Theraband Level (Shoulder External Rotation)  Level 1 (Yellow)    Internal Rotation  Strengthening;Left;5 reps;Theraband    Theraband Level (Shoulder Internal Rotation)  Level 1 (Yellow)    Internal Rotation Limitations  [place] and tactile cues for correct direction of motion    Flexion  Strengthening;Left;5  reps;Theraband    Theraband Level (Shoulder Flexion)  Level 1 (Yellow)    Extension  Strengthening;Left;5 reps;Theraband    Theraband Level (Shoulder Extension)  Level 1 (Yellow)    Extension Limitations  Tactile cues and demo for scapular retraction at end motion      Manual Therapy   Manual Therapy  Manual Lymphatic Drainage (MLD);Passive ROM;Myofascial release    Myofascial Release  To Lt axilla during P/ROM, cross hands technique at chest wall horizontally    Manual Lymphatic Drainage (MLD)  In Supine with HOB slightly elevated: Short neck, bil shoulder collectors, superficial and deep abdominals, Lt inguinal and Rt axillary nodes, Lt axillo-inguinal and anterior inter-axillary anastomosis then focused on Lt UE working from lateral upper arm to dorsal hand proximal to distal then retracing all steps    Passive ROM  To Lt shoulder into flexion, abduction and D2 to tolerance             PT Education - 12/19/19 1111    Education Details  [place] with yellow theraband    Person(s) Educated  Patient    Methods  Explanation;Demonstration;Tactile cues;Other (comment)   updated [place] code   Comprehension  Verbalized understanding;Returned demonstration;Need further instruction       PT Short Term Goals - 11/29/19 0953      PT SHORT TERM GOAL #1   Title  Pt will be independent with HEP and MLD within 3 weeks in order to demonstrate autonomy of care.    Baseline  Pt currently just received her HEP and does not know how to perform MLD    Time  3    Period  Weeks    Status  New    Target Date  12/27/19        PT Long Term Goals - 11/29/19 0954      PT LONG TERM GOAL #1   Title  Patient to be properly fitted with compression garment to wear on daily basis.    Baseline  Pt currently does not have any compression garments.    Time  6    Period  Weeks    Status  New    Target Date  01/17/20      PT LONG TERM GOAL #2   Title  Patient will have reduction of limb girth by 1  cm at proximal brachium and 0.5 cm at the L styloid process.    Baseline  see measurements    Time  6    Period  Weeks    Status  New    Target Date  01/17/20      PT LONG TERM GOAL #3   Title  Patient will be independent in prevention/self-care management principles including self-massage and compression and long term management plan for edema.    Baseline  pt currently does not know how to manage edema    Time  6    Period  Weeks    Status  New    Target Date  01/17/20      PT LONG TERM GOAL #4   Title  Pt will demonstrate 120 degrees L shoulder flexion and abduction in order to improve functional ROM.    Baseline  L shoulder flexion 105, abduction 75    Time  6    Period  Weeks    Status  New    Target Date  01/10/20      PT LONG TERM GOAL #5   Title  Pt will report 50% improvement in pain and function in the L arm within 6 weeks to demonstrate improved quality of life.    Baseline  10/10 pain occasionally    Time  6    Period  Weeks    Status  New    Target Date  01/17/20            Plan - 12/19/19 1106    Clinical Impression Statement  Pt brought her new WearEase vest and therapist assisted pt with donning. This was a good fit and pt reports very comfortable. Issued 1/4" gray foam for pt to wear in bra at lateral trunk wear she reports feeling intermittent tenderness. Progressed HEP to include [place] for Lt UE with yellow theraband. She tolerated this well and was able to return good demo after cuing, see flowsheet for this. Also continued with manual therapy to work on decreasing axillary tightness and improving ROM. Also verbally instructed pt during manual therapy in how to don her compression sleeve in case this comes before next session so she can began wearing. She verbalized understanding.    Personal Factors and Comorbidities  Comorbidity 2;Social Background    Comorbidities  L lumpectomy with radiation, multiple previous joint aches/pains, decreased shoulder  ROM on the R shoulder as well    Stability/Clinical Decision Making  Stable/Uncomplicated    Rehab Potential  Good    PT Frequency  2x / week    PT Duration  6 weeks    PT Treatment/Interventions  Moist Heat;Iontophoresis 97m/ml Dexamethasone;Neuromuscular re-education;Patient/family education;Manual techniques;Therapeutic exercise;Therapeutic activities    PT Next Visit Plan  How is vest fitting? Did new compression sleeve come, assess if so; Review self MLD having pt return demo while performing, cont myofascial release and gentle P/ROM of Lt shoulder, AA/ROM exercises and progress HEP, review [place]    PT Home Exercise Plan  Access Code: [email or web address]    Consulted and Agree with Plan of Care  Patient       Patient will benefit from skilled therapeutic intervention in order to improve the following deficits and impairments:  Increased edema, Decreased range of motion, Decreased scar mobility, Decreased knowledge of precautions, Increased fascial restricitons, Pain  Visit Diagnosis: Malignant neoplasm of upper-inner quadrant of left breast in female, estrogen receptor positive (HCC)  Lymphedema, not elsewhere classified  Acute pain of left shoulder  Stiffness of left shoulder, not elsewhere classified  Stiffness of right shoulder, not elsewhere classified     Problem List Patient Active Problem List   Diagnosis Date Noted  . Morbid (  severe) obesity due to excess calories ([facility]) 11/25/2019  . Port-A-Cath in place 03/22/2019  . Chemotherapy-induced fatigue 03/22/2019  . Genetic testing 01/29/2019  . Family history of breast cancer   . Infiltrating ductal carcinoma of left breast ([facility]) 01/03/2019  . Malignant neoplasm of upper-inner quadrant of left breast in female, estrogen receptor positive ([place]) 12/20/2018  . Essential hypertension 10/02/2017  . Gastroesophageal reflux disease without esophagitis 10/02/2017  . Hyperlipidemia 10/02/2017  . OSA (obstructive sleep apnea)  10/02/2017  . Type 2 diabetes mellitus with complication, without long-term current use of insulin ([facility]) 10/02/2017  . Special screening for malignant neoplasms, colon     Otelia Limes, PTA 12/19/2019, 11:28 AM  [facility], [place], [address] Phone: [phone]   Fax:  [phone]  Name: Wendelin Reader MRN: [id number] Date of Birth: 31-Oct-1960

## 2019-12-19 NOTE — Patient Instructions (Signed)
Access Code: [email or web address] URL: [email or web address] Date: 12/19/2019 Prepared by: Collie Siad  Exercises Shoulder Flexion Wall Slide with Towel - 3 x daily - 7 x weekly - 1 sets - 10 reps Standing Shoulder Abduction Slides at Wall - 3 x daily - 7 x weekly - 1 sets - 10 reps Seated Scapular Retraction - 3 x daily - 7 x weekly - 1 sets - 10 reps Standing Single Arm Shoulder Flexion with Posterior Anchored Resistance - 1 x daily - 7 x weekly - 10 reps - 3 sets Standing Shoulder Internal Rotation with Anchored Resistance - 1 x daily - 7 x weekly - 10 reps - 3 sets Shoulder External Rotation with Anchored Resistance - 1 x daily - 7 x weekly - 10 reps - 3 sets Single Arm Shoulder Extension with Anchored Resistance - 1 x daily - 7 x weekly - 10 reps - 3 sets  ([place]wood exercises added in addition to previous exs)

## 2019-12-24 ENCOUNTER — Other Ambulatory Visit: Payer: Self-pay

## 2019-12-24 ENCOUNTER — Ambulatory Visit: Payer: Medicare Other | Attending: Nurse Practitioner

## 2019-12-24 DIAGNOSIS — Z170 Estrogen receptor positive status [ER+]: Secondary | ICD-10-CM | POA: Diagnosis present

## 2019-12-24 DIAGNOSIS — M25612 Stiffness of left shoulder, not elsewhere classified: Secondary | ICD-10-CM | POA: Diagnosis present

## 2019-12-24 DIAGNOSIS — C50212 Malignant neoplasm of upper-inner quadrant of left female breast: Secondary | ICD-10-CM | POA: Diagnosis present

## 2019-12-24 DIAGNOSIS — I890 Lymphedema, not elsewhere classified: Secondary | ICD-10-CM | POA: Insufficient documentation

## 2019-12-24 DIAGNOSIS — M25512 Pain in left shoulder: Secondary | ICD-10-CM

## 2019-12-24 DIAGNOSIS — M25611 Stiffness of right shoulder, not elsewhere classified: Secondary | ICD-10-CM

## 2019-12-24 NOTE — Therapy (Signed)
[place], [place], [address] Phone: [phone]   Fax:  [phone]  Physical Therapy Treatment  Patient Details  Name: Emma Casey MRN: [id number] Date of Birth: 12-Nov-1960 Referring Provider (PT): Koleen Distance NP   Encounter Date: 12/24/2019  PT End of Session - 12/24/19 1100    Visit Number  5    Number of Visits  13    Date for PT Re-Evaluation  01/17/20    PT Start Time  1006   pt arrived late   PT Stop Time  1100    PT Time Calculation (min)  54 min    Activity Tolerance  Patient tolerated treatment well    Behavior During Therapy  [facility] for tasks assessed/performed       Past Medical History:  Diagnosis Date  . Anxiety   . Arthritis    right hand  . Bronchitis   . Cancer ([place]) 11/2018   left breast ca  . Depression   . Diabetes mellitus without complication ([place])   . Family history of breast cancer   . GERD (gastroesophageal reflux disease)   . Headache   . Heart murmur   . Herpes 1&2   . Hypertension   . Leg fracture, left    as a child  . OSA on CPAP    has CPAP but has not used in over a month    Past Surgical History:  Procedure Laterality Date  . ABDOMINAL HYSTERECTOMY    . BREAST LUMPECTOMY WITH RADIOACTIVE SEED AND SENTINEL LYMPH NODE BIOPSY Left 01/08/2019   Procedure: LEFT BREAST LUMPECTOMY WITH RADIOACTIVE SEED AND LEFT AXILLARY DEEP SENTINEL LYMPH NODE BIOPSY WITH BLUE DYE INJECTION;  Surgeon: Fanny Skates, MD;  Location: [facility];  Service: General;  Laterality: Left;  . COLONOSCOPY N/A 09/02/2015   Procedure: COLONOSCOPY;  Surgeon: Danie Binder, MD;  [facility];  Service: Endoscopy;  Laterality: N/A;  11:15 Am  . EYE SURGERY Right    cataract  . HAND SURGERY Right    broken bone  . PORT-A-CATH REMOVAL Right 08/21/2019   Procedure: REMOVAL PORT-A-CATH;  Surgeon: Fanny Skates, MD;  Location: [facility];  Service:  General;  Laterality: Right;  . PORTACATH PLACEMENT N/A 03/01/2019   Procedure: INSERTION PORT-A-CATH;  Surgeon: Fanny Skates, MD;  Location: [facility];  Service: General;  Laterality: N/A;  . RE-EXCISION OF BREAST LUMPECTOMY Left 03/01/2019   Procedure: RE-EXCISION OF LEFT BREAST LUMPECTOMY MULITPLE MARGINS;  Surgeon: Fanny Skates, MD;  Location: [place] [facility];  Service: General;  Laterality: Left;  . TUBAL LIGATION      There were no vitals filed for this visit.  Subjective Assessment - 12/24/19 1011    Subjective  My Lt breast and shoulder are doing well. 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 Chemotherapy and radiation were performed.    Patient Stated Goals  I want to decrease my pain and be able to move my arm better.    Currently in Pain?  No/denies                       [facility] Adult PT Treatment/Exercise - 12/24/19 0001      Manual Therapy  Myofascial Release  To Lt axilla during P/ROM, cross hands technique at chest wall horizontally    Manual Lymphatic Drainage (MLD)  In Supine with HOB slightly elevated: Short neck, bil shoulder collectors, superficial and deep abdominals, Lt inguinal and Rt axillary nodes, Lt axillo-inguinal and anterior inter-axillary anastomosis and Lt breast during P/ROM and myofascial release    Passive ROM  To Lt shoulder into flexion and abduction to tolerance               PT Short Term Goals - 11/29/19 0953      PT SHORT TERM GOAL #1   Title  Pt will be independent with HEP and MLD within 3 weeks in order to demonstrate autonomy of care.    Baseline  Pt currently just received her HEP and does not know how to perform MLD    Time  3    Period  Weeks    Status  New    Target Date  12/27/19        PT  Long Term Goals - 11/29/19 0954      PT LONG TERM GOAL #1   Title  Patient to be properly fitted with compression garment to wear on daily basis.    Baseline  Pt currently does not have any compression garments.    Time  6    Period  Weeks    Status  New    Target Date  01/17/20      PT LONG TERM GOAL #2   Title  Patient will have reduction of limb girth by 1 cm at proximal brachium and 0.5 cm at the L styloid process.    Baseline  see measurements    Time  6    Period  Weeks    Status  New    Target Date  01/17/20      PT LONG TERM GOAL #3   Title  Patient will be independent in prevention/self-care management principles including self-massage and compression and long term management plan for edema.    Baseline  pt currently does not know how to manage edema    Time  6    Period  Weeks    Status  New    Target Date  01/17/20      PT LONG TERM GOAL #4   Title  Pt will demonstrate 120 degrees L shoulder flexion and abduction in order to improve functional ROM.    Baseline  L shoulder flexion 105, abduction 75    Time  6    Period  Weeks    Status  New    Target Date  01/10/20      PT LONG TERM GOAL #5   Title  Pt will report 50% improvement in pain and function in the L arm within 6 weeks to demonstrate improved quality of life.    Baseline  10/10 pain occasionally    Time  6    Period  Weeks    Status  New    Target Date  01/17/20            Plan - 12/24/19 1101    Clinical Impression Statement  Pt overall is making excellent progress. Her P/ROM is greatly improved as is her tolerance to being stretched. Pt continues to be compliant at home with self manual lymph drainage and HEP as well. Her compression sleeve order was finalized last week per fitter so this should arrive soon.    Personal Factors  and Comorbidities  Comorbidity 2;Social Background    Comorbidities  L lumpectomy with radiation, multiple previous joint aches/pains, decreased shoulder ROM on the Emma  shoulder as well    Stability/Clinical Decision Making  Stable/Uncomplicated    Rehab Potential  Good    PT Frequency  2x / week    PT Duration  6 weeks    PT Treatment/Interventions  Moist Heat;Iontophoresis 22m/ml Dexamethasone;Neuromuscular re-education;Patient/family education;Manual techniques;Therapeutic exercise;Therapeutic activities    PT Next Visit Plan  Remeasure circumference and assess goals; Did new compression sleeve come, assess if so; Review self MLD having pt return demo while performing, cont myofascial release and gentle P/ROM of Lt shoulder, AA/ROM exercises and progress HEP, review [place]    PT Home Exercise Plan  Access Code: [email or web address]    Consulted and Agree with Plan of Care  Patient       Patient will benefit from skilled therapeutic intervention in order to improve the following deficits and impairments:  Increased edema, Decreased range of motion, Decreased scar mobility, Decreased knowledge of precautions, Increased fascial restricitons, Pain  Visit Diagnosis: Malignant neoplasm of upper-inner quadrant of left breast in female, estrogen receptor positive (HCC)  Lymphedema, not elsewhere classified  Acute pain of left shoulder  Stiffness of left shoulder, not elsewhere classified  Stiffness of right shoulder, not elsewhere classified     Problem List Patient Active Problem List   Diagnosis Date Noted  . Morbid (severe) obesity due to excess calories ([place] 11/25/2019  . Port-A-Cath in place 03/22/2019  . Chemotherapy-induced fatigue 03/22/2019  . Genetic testing 01/29/2019  . Family history of breast cancer   . Infiltrating ductal carcinoma of left breast ([place] 01/03/2019  . Malignant neoplasm of upper-inner quadrant of left breast in female, estrogen receptor positive ([place] 12/20/2018  . Essential hypertension 10/02/2017  . Gastroesophageal reflux disease without esophagitis 10/02/2017  . Hyperlipidemia 10/02/2017  . OSA (obstructive sleep apnea)  10/02/2017  . Type 2 diabetes mellitus with complication, without long-term current use of insulin ([place] 10/02/2017  . Special screening for malignant neoplasms, colon     ROtelia Limes PTA 12/24/2019, 12:36 PM  [place] [address]Phone: [phone]  Fax:  [phone] Name: RTyjai CharbonnetMRN: [id number] of Birth: 107-29-1962

## 2019-12-26 ENCOUNTER — Other Ambulatory Visit: Payer: Self-pay

## 2019-12-26 ENCOUNTER — Ambulatory Visit: Payer: Medicare Other

## 2019-12-26 DIAGNOSIS — M25512 Pain in left shoulder: Secondary | ICD-10-CM

## 2019-12-26 DIAGNOSIS — M25611 Stiffness of right shoulder, not elsewhere classified: Secondary | ICD-10-CM

## 2019-12-26 DIAGNOSIS — M25612 Stiffness of left shoulder, not elsewhere classified: Secondary | ICD-10-CM

## 2019-12-26 DIAGNOSIS — C50212 Malignant neoplasm of upper-inner quadrant of left female breast: Secondary | ICD-10-CM

## 2019-12-26 DIAGNOSIS — I890 Lymphedema, not elsewhere classified: Secondary | ICD-10-CM

## 2019-12-26 NOTE — Therapy (Signed)
[place], [place], [address] Phone: [phone]   Fax:  [phone]  Physical Therapy Treatment  Patient Details  Name: Emma Casey MRN: [id number] Date of Birth: 11/14/1960 Referring Provider (PT): Koleen Distance NP   Encounter Date: 12/26/2019  PT End of Session - 12/26/19 1335    Visit Number  6    Number of Visits  13    Date for PT Re-Evaluation  01/17/20    PT Start Time  1016   pt arrived late   PT Stop Time  1105    PT Time Calculation (min)  49 min    Activity Tolerance  Patient tolerated treatment well    Behavior During Therapy  [facility] for tasks assessed/performed       Past Medical History:  Diagnosis Date  . Anxiety   . Arthritis    right hand  . Bronchitis   . Cancer ([place]) 11/2018   left breast ca  . Depression   . Diabetes mellitus without complication ([place])   . Family history of breast cancer   . GERD (gastroesophageal reflux disease)   . Headache   . Heart murmur   . Herpes 1&2   . Hypertension   . Leg fracture, left    as a child  . OSA on CPAP    has CPAP but has not used in over a month    Past Surgical History:  Procedure Laterality Date  . ABDOMINAL HYSTERECTOMY    . BREAST LUMPECTOMY WITH RADIOACTIVE SEED AND SENTINEL LYMPH NODE BIOPSY Left 01/08/2019   Procedure: LEFT BREAST LUMPECTOMY WITH RADIOACTIVE SEED AND LEFT AXILLARY DEEP SENTINEL LYMPH NODE BIOPSY WITH BLUE DYE INJECTION;  Surgeon: Fanny Skates, MD;  Location: [place] [facility];  Service: General;  Laterality: Left;  . COLONOSCOPY N/A 09/02/2015   Procedure: COLONOSCOPY;  Surgeon: Danie Binder, MD;  [facility];  Service: Endoscopy;  Laterality: N/A;  11:15 Am  . EYE SURGERY Right    cataract  . HAND SURGERY Right    broken bone  . PORT-A-CATH REMOVAL Right 08/21/2019   Procedure: REMOVAL PORT-A-CATH;  Surgeon: Fanny Skates, MD;  Location: [place] [facility];  Service:  General;  Laterality: Right;  . PORTACATH PLACEMENT N/A 03/01/2019   Procedure: INSERTION PORT-A-CATH;  Surgeon: Fanny Skates, MD;  Location: [facility];  Service: General;  Laterality: N/A;  . RE-EXCISION OF BREAST LUMPECTOMY Left 03/01/2019   Procedure: RE-EXCISION OF LEFT BREAST LUMPECTOMY MULITPLE MARGINS;  Surgeon: Fanny Skates, MD;  Location: [facility];  Service: General;  Laterality: Left;  . TUBAL LIGATION      There were no vitals filed for this visit.  Subjective Assessment - 12/26/19 1019    Subjective  I got my new compression sleeve and gauntlet. It fits good except my thumb webspace gots tender after about 15-20 mins, but I was still able to wear it all day. I'm going try putting a cotton ball in there. I feel like I'm doing good and want to come back in a few weeks for you to check me one more time.    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 Chemotherapy and radiation were performed.    Patient Stated Goals  I want to decrease my pain and be able to move my arm better.  Currently in Pain?  No/denies         [facility] PT Assessment - 12/26/19 0001      AROM   Right Shoulder Flexion  145 Degrees    Right Shoulder ABduction  155 Degrees    Left Shoulder Flexion  142 Degrees    Left Shoulder ABduction  145 Degrees        LYMPHEDEMA/ONCOLOGY QUESTIONNAIRE - 12/26/19 1029      Left Upper Extremity Lymphedema   15 cm Proximal to Olecranon Process  40.6 cm    10 cm Proximal to Olecranon Process  39.8 cm    Olecranon Process  28.9 cm    15 cm Proximal to Ulnar Styloid Process  26 cm    10 cm Proximal to Ulnar Styloid Process  21.7 cm    Just Proximal to Ulnar Styloid Process  16.7 cm    Across Hand at [employer]  19.4 cm    At [place] of 2nd Digit  6.5 cm                [facility] Adult PT  Treatment/Exercise - 12/26/19 0001      Shoulder Exercises: Seated   Other Seated Exercises  Bil UE 3 way raises with 1 lb x5 each with pt returning therapiast demo and VC for correct UE positioning for each      Manual Therapy   Manual therapy comments  Remeasured circumference of Lt UE    Manual Lymphatic Drainage (MLD)  In Supine with HOB slightly elevated: Short neck, superficial and deep abdominals, Lt inguinal and Rt axillary nodes, Lt axillo-inguinal and anterior inter-axillary anastomosis and Lt UE working from proximal to distall then retacing all steps.    Passive ROM  To Lt shoulder into flexion and abduction to tolerance             PT Education - 12/26/19 1344    Education Details  Bil UE 3 way raises with 1 lb and instructed how to safely progress weights with keeping risk of progressing lymphedema low    Person(s) Educated  Patient    Methods  Explanation;Demonstration;Handout    Comprehension  Verbalized understanding;Returned demonstration       PT Short Term Goals - 11/29/19 0953      PT SHORT TERM GOAL #1   Title  Pt will be independent with HEP and MLD within 3 weeks in order to demonstrate autonomy of care.    Baseline  Pt currently just received her HEP and does not know how to perform MLD    Time  3    Period  Weeks    Status  New    Target Date  12/27/19        PT Long Term Goals - 12/26/19 1021      PT LONG TERM GOAL #1   Title  Patient to be properly fitted with compression garment to wear on daily basis.    Baseline  Pt currently does not have any compression garments.; new compression sleeve and gauntlet arrived this week - 12/26/19    Status  Achieved      PT LONG TERM GOAL #2   Title  Patient will have reduction of limb girth by 1 cm at proximal brachium and 0.5 cm at the L styloid process.    Baseline  40.6 cm at Lt proximal brachium (3.8 cm reduction) and 16.7 (0.3 cm reduction) cm at Lt styloid process - 12/26/19    Status  Partially Met       PT LONG TERM GOAL #3   Title  Patient will be independent in prevention/self-care management principles including self-massage and compression and long term management plan for edema.    Baseline  pt currently does not know how to manage edema; doing the self MLD daily - 12/26/19    Status  Achieved      PT LONG TERM GOAL #4   Title  Pt will demonstrate 120 degrees L shoulder flexion and abduction in order to improve functional ROM.    Baseline  L shoulder flexion 105, abduction 75; 142 degrees flexion and 145 degrees abduction - 12/26/19    Status  Achieved      PT LONG TERM GOAL #5   Title  Pt will report 50% improvement in pain and function in the L arm within 6 weeks to demonstrate improved quality of life.    Baseline  70% improvement reported with pain and function of Lt arm - 12/26/19    Status  Achieved            Plan - 12/26/19 1336    Clinical Impression Statement  --       Patient will benefit from skilled therapeutic intervention in order to improve the following deficits and impairments:  Increased edema, Decreased range of motion, Decreased scar mobility, Decreased knowledge of precautions, Increased fascial restricitons, Pain  Visit Diagnosis: Malignant neoplasm of upper-inner quadrant of left breast in female, estrogen receptor positive (HCC)  Lymphedema, not elsewhere classified  Acute pain of left shoulder  Stiffness of left shoulder, not elsewhere classified  Stiffness of right shoulder, not elsewhere classified     Problem List Patient Active Problem List   Diagnosis Date Noted  . Morbid (severe) obesity due to excess calories ([place]) 11/25/2019  . Port-A-Cath in place 03/22/2019  . Chemotherapy-induced fatigue 03/22/2019  . Genetic testing 01/29/2019  . Family history of breast cancer   . Infiltrating ductal carcinoma of left breast ([place]) 01/03/2019  . Malignant neoplasm of upper-inner quadrant of left breast in female, estrogen receptor  positive ([place]) 12/20/2018  . Essential hypertension 10/02/2017  . Gastroesophageal reflux disease without esophagitis 10/02/2017  . Hyperlipidemia 10/02/2017  . OSA (obstructive sleep apnea) 10/02/2017  . Type 2 diabetes mellitus with complication, without long-term current use of insulin ([place]) 10/02/2017  . Special screening for malignant neoplasms, colon     Otelia Limes, PTA 12/26/2019, 1:50 PM  [place], [place], [address] Phone: [phone]   Fax:  [phone]  Name: Emma Casey MRN: [id number] Date of Birth: [birth year]

## 2020-01-09 ENCOUNTER — Other Ambulatory Visit: Payer: Self-pay

## 2020-01-09 ENCOUNTER — Ambulatory Visit: Payer: Medicare Other

## 2020-01-09 DIAGNOSIS — I890 Lymphedema, not elsewhere classified: Secondary | ICD-10-CM

## 2020-01-09 DIAGNOSIS — C50212 Malignant neoplasm of upper-inner quadrant of left female breast: Secondary | ICD-10-CM

## 2020-01-09 DIAGNOSIS — M25512 Pain in left shoulder: Secondary | ICD-10-CM

## 2020-01-09 DIAGNOSIS — M25612 Stiffness of left shoulder, not elsewhere classified: Secondary | ICD-10-CM

## 2020-01-09 DIAGNOSIS — M25611 Stiffness of right shoulder, not elsewhere classified: Secondary | ICD-10-CM

## 2020-01-09 DIAGNOSIS — Z170 Estrogen receptor positive status [ER+]: Secondary | ICD-10-CM

## 2020-01-09 NOTE — Therapy (Addendum)
[place], [place], [address] Phone: [phone]   Fax:  [phone]  Physical Therapy Discharge Note  Patient Details  Name: Emma Casey MRN: [id number] Date of Birth: [birth year] Referring Provider (PT): Koleen Distance NP   Encounter Date: 01/09/2020  PT End of Session - 01/09/20 1105    Visit Number  7    Number of Visits  13    Date for PT Re-Evaluation  01/17/20    PT Start Time  6599    PT Stop Time  1101    PT Time Calculation (min)  59 min    Activity Tolerance  Patient tolerated treatment well    Behavior During Therapy  [facility] for tasks assessed/performed       Past Medical History:  Diagnosis Date  . Anxiety   . Arthritis    right hand  . Bronchitis   . Cancer ([place]) 11/2018   left breast ca  . Depression   . Diabetes mellitus without complication ([place])   . Family history of breast cancer   . GERD (gastroesophageal reflux disease)   . Headache   . Heart murmur   . Herpes 1&2   . Hypertension   . Leg fracture, left    as a child  . OSA on CPAP    has CPAP but has not used in over a month    Past Surgical History:  Procedure Laterality Date  . ABDOMINAL HYSTERECTOMY    . BREAST LUMPECTOMY WITH RADIOACTIVE SEED AND SENTINEL LYMPH NODE BIOPSY Left 01/08/2019   Procedure: LEFT BREAST LUMPECTOMY WITH RADIOACTIVE SEED AND LEFT AXILLARY DEEP SENTINEL LYMPH NODE BIOPSY WITH BLUE DYE INJECTION;  Surgeon: Fanny Skates, MD;  Location: [facility];  Service: General;  Laterality: Left;  . COLONOSCOPY N/A 09/02/2015   Procedure: COLONOSCOPY;  Surgeon: Danie Binder, MD;  [facility];  Service: Endoscopy;  Laterality: N/A;  11:15 Am  . EYE SURGERY Right    cataract  . HAND SURGERY Right    broken bone  . PORT-A-CATH REMOVAL Right 08/21/2019   Procedure: REMOVAL PORT-A-CATH;  Surgeon: Fanny Skates, MD;  Location: [facility];  Service: General;   Laterality: Right;  . PORTACATH PLACEMENT N/A 03/01/2019   Procedure: INSERTION PORT-A-CATH;  Surgeon: Fanny Skates, MD;  Location: [facility];  Service: General;  Laterality: N/A;  . RE-EXCISION OF BREAST LUMPECTOMY Left 03/01/2019   Procedure: RE-EXCISION OF LEFT BREAST LUMPECTOMY MULITPLE MARGINS;  Surgeon: Fanny Skates, MD;  Location: [facility];  Service: General;  Laterality: Left;  . TUBAL LIGATION      There were no vitals filed for this visit.  Subjective Assessment - 01/09/20 1004    Subjective  I've been doing pretty good. Doing my exercises though I'm having trouble uploading all of them, need you to resend it to me. I've  been wearing my compression sleeve most days, when  my friend can help me. And I've been wearing the gauntlet too with the cotton ball at my thumb to decrease the friction. I think I'm ready to make today my last visit but want you to recheck my measurements first.    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 Chemotherapy and radiation were performed.    Patient Stated Goals  I want to decrease my pain  and be able to move my arm better.            LYMPHEDEMA/ONCOLOGY QUESTIONNAIRE - 01/09/20 1016      Left Upper Extremity Lymphedema   15 cm Proximal to Olecranon Process  40.4 cm    10 cm Proximal to Olecranon Process  39.8 cm    Olecranon Process  28.6 cm    15 cm Proximal to Ulnar Styloid Process  26.1 cm    10 cm Proximal to Ulnar Styloid Process  21.7 cm    Just Proximal to Ulnar Styloid Process  16.6 cm    Across Hand at [employer]  19.6 cm    At [place] of 2nd Digit  6.2 cm                           PT Short Term Goals - 01/10/20 1045      PT SHORT TERM GOAL #1   Title  Pt will be independent with HEP and MLD within 3 weeks in order to demonstrate autonomy of care.     Status  Achieved        PT Long Term Goals - 01/09/20 1010      PT LONG TERM GOAL #1   Title  Patient to be properly fitted with compression garment to wear on daily basis.    Baseline  Pt currently does not have any compression garments.; new compression sleeve and gauntlet arrived this week - 12/26/19; pt reports compliance with wear of these most days and good fit/comfortable-01/09/20    Status  Achieved      PT LONG TERM GOAL #2   Title  Patient will have reduction of limb girth by 1 cm at proximal brachium and 0.5 cm at the L styloid process.    Baseline  40.6 cm at Lt proximal brachium (3.8 cm reduction) and 16.7 (0.3 cm reduction) cm at Lt styloid process - 12/26/19; 40.4 cm and Lt prox brachium (4 cm reduct)and 16.6 cm (0.4 cm recuct) at Lt styloid process - 01/09/20    Status  Partially Met      PT LONG TERM GOAL #3   Title  Patient will be independent in prevention/self-care management principles including self-massage and compression and long term management plan for edema.    Baseline  pt currently does not know how to manage edema; doing the self MLD daily - 12/26/19; doing self MLD "most days" - 01/09/20    Status  Achieved      PT LONG TERM GOAL #4   Title  Pt will demonstrate 120 degrees L shoulder flexion and abduction in order to improve functional ROM.    Baseline  L shoulder flexion 105, abduction 75; 142 degrees flexion and 145 degrees abduction - 12/26/19; Lt shoulder 146 degrees flexion and 148 degrees abduction without pain - 01/09/20    Status  Achieved      PT LONG TERM GOAL #5   Title  Pt will report 50% improvement in pain and function in the L arm within 6 weeks to demonstrate improved quality of life.    Baseline  70% improvement reported with pain and function of Lt arm - 12/26/19; 85-90% improvement at this time - 01/09/20    Status  Achieved            Plan - 01/09/20 1020    Clinical Impression Statement  Pt has done excellent with this episode of  physical therapy having met almost all goals (circumference not met by only 0.1 cm) and is compliant with her HEP at this time. Pt is ready for D/C this visit.    Personal Factors and Comorbidities  Comorbidity 2;Social Background    Comorbidities  L lumpectomy with radiation, multiple previous joint aches/pains, decreased shoulder ROM on the R shoulder as well    Stability/Clinical Decision Making  Stable/Uncomplicated    Rehab Potential  Good    PT Frequency  2x / week    PT Duration  6 weeks    PT Treatment/Interventions  Moist Heat;Iontophoresis 58m/ml Dexamethasone;Neuromuscular re-education;Patient/family education;Manual techniques;Therapeutic exercise;Therapeutic activities    PT Next Visit Plan  D/C this visit.    PT Home Exercise Plan  Access Code: [email or web address]   Consulted and Agree with Plan of Care  Patient       Patient will benefit from skilled therapeutic intervention in order to improve the following deficits and impairments:  Increased edema, Decreased range of motion, Decreased scar mobility, Decreased knowledge of precautions, Increased fascial restricitons, Pain  Visit Diagnosis: Malignant neoplasm of upper-inner quadrant of left breast in female, estrogen receptor positive (HCC)  Lymphedema, not elsewhere classified  Acute pain of left shoulder  Stiffness of left shoulder, not elsewhere classified  Stiffness of right shoulder, not elsewhere classified     Problem List Patient Active Problem List   Diagnosis Date Noted  . Morbid (severe) obesity due to excess calories ([place] 11/25/2019  . Port-A-Cath in place 03/22/2019  . Chemotherapy-induced fatigue 03/22/2019  . Genetic testing 01/29/2019  . Family history of breast cancer   . Infiltrating ductal carcinoma of left breast ([place] 01/03/2019  . Malignant neoplasm of upper-inner quadrant of left breast in female, estrogen receptor positive ([place] 12/20/2018  . Essential hypertension 10/02/2017  . Gastroesophageal  reflux disease without esophagitis 10/02/2017  . Hyperlipidemia 10/02/2017  . OSA (obstructive sleep apnea) 10/02/2017  . Type 2 diabetes mellitus with complication, without long-term current use of insulin ([place] 10/02/2017  . Special screening for malignant neoplasms, colon    PHYSICAL THERAPY DISCHARGE SUMMARY  Plan: Patient agrees to discharge.  Patient goals were partially met. Patient is being discharged due to being pleased with the current functional level.  ?????       VCollie Siad PTA 01/10/2020, 10:45 AM  CTomma Rakers PT 01/10/20 10:45 AM   [place] [address]Phone: [phone]  Fax:  [phone] Name: RKaylin MarconMRN: [id number] of Birth: [birth year]

## 2020-01-16 NOTE — Progress Notes (Signed)
[name]   Telephone:([phone]) [phone] [phone]   Clinic Follow up Note   Patient Care Team: Leeanne Rio, MD as PCP - General (Family Medicine) Mauro Kaufmann, RN as Oncology Nurse Navigator Rockwell Germany, RN as Oncology Nurse Navigator Fanny Skates, MD as Consulting Physician (General Surgery) Truitt Merle, MD as Consulting Physician (Hematology) Kyung Rudd, MD as Consulting Physician (Radiation Oncology) Alla Feeling, NP as Nurse Practitioner (Nurse Practitioner)  Date of Service:  01/22/2020  CHIEF COMPLAINT: f/u of left breast cancer  SUMMARY OF ONCOLOGIC HISTORY: Oncology History Overview Note  Cancer Staging Malignant neoplasm of upper-inner quadrant of left breast in female, estrogen receptor positive ([name]) Staging form: Breast, AJCC 8th Edition - Clinical stage from 12/17/2018: Stage Unknown (cTX, cN0, cM0, G2, ER+, PR+, HER2-) - Signed by Truitt Merle, MD on 12/25/2018 - Pathologic stage from 01/08/2019: Stage IA (pT1c, pN0, cM0, G3, ER+, PR+, HER2-) - Signed by Truitt Merle, MD on 01/22/2019     Malignant neoplasm of upper-inner quadrant of left breast in female, estrogen receptor positive ([name])  11/28/2018 Mammogram   Diagnostic Mammogram 11/28/18 IMPRESSION:  Suspcious mass in upper inner quadrant of the left breast  at posterior depth.    12/17/2018 Initial Biopsy   Diagnosis 12/17/18 Breast, left, needle core biopsy, upper inner quadrant - INVASIVE DUCTAL CARCINOMA, SEE COMMENT. - DUCTAL CARCINOMA IN SITU.   12/17/2018 Cancer Staging   Staging form: Breast, AJCC 8th Edition - Clinical stage from 12/17/2018: Stage Unknown (cTX, cN0, cM0, G2, ER+, PR+, HER2-) - Signed by Truitt Merle, MD on 12/25/2018   12/20/2018 Initial Diagnosis   Malignant neoplasm of upper-inner quadrant of left breast in female, estrogen receptor positive ([name])   12/20/2018 Receptors her2   Results: IMMUNOHISTOCHEMICAL AND MORPHOMETRIC ANALYSIS PERFORMED MANUALLY The  tumor cells are NEGATIVE for Her2 (1+). Estrogen Receptor: 95%, POSITIVE, STRONG STAINING INTENSITY Progesterone Receptor: 30%, POSITIVE, STRONG STAINING INTENSITY Proliferation Marker Ki67: 20%   01/08/2019 Surgery   LEFT BREAST LUMPECTOMY WITH RADIOACTIVE SEED AND LEFT AXILLARY DEEP SENTINEL LYMPH NODE BIOPSY WITH BLUE DYE INJECTION by Dr [name] 01/08/19    01/08/2019 Pathology Results   Diagnosis 01/08/19 1. Breast, lumpectomy, left - INVASIVE DUCTAL CARCINOMA, GRADE III/III, SPANNING 1.8 CM. - DUCTAL CARCINOMA IN SITU, HIGH GRADE. - DUCTAL CARCINOMA IN SITU IS FOCALLY PRESENT AT THE ANTERIOR MARGIN OF SPECIMEN #1. - DUCTAL CARCINOMA IN SITU IS FOCALLY LESS THAN 0.1 CM FROM THE INFERIOR MARGIN OF SPECIMEN # 1. - SEE ONCOLOGY TABLE BELOW. 2. Breast, excision, left medial margin - DUCTAL CARCINOMA IN SITU, FOCALLY PRESENT AT THE MEDIAL MARGIN OF SPECIMEN # 2. 3. Breast, excision, left posterior margin - BENIGN BREAST PARENCHYMA. - THERE IS NO EVIDENCE OF MALIGNANCY. - SEE COMMENT. 4. Lymph node, sentinel, biopsy, left axillary - THERE IS NO EVIDENCE OF CARCINOMA IN 1 OF 1 LYMPH NODE (0/1). 5. Lymph node, sentinel, biopsy, left axillary - THERE IS NO EVIDENCE OF CARCINOMA IN 1 OF 1 LYMPH NODE (0/1). 6. Lymph node, sentinel, biopsy, left axillary - THERE IS NO EVIDENCE OF CARCINOMA IN 1 OF 1 LYMPH NODE (0/1)   01/08/2019 Oncotype testing   recurrence score of 27 with risk of recurrence 16% with Tamoxifen or AI alone. There is a benefit of chemotherapy >15%    01/08/2019 Cancer Staging   Staging form: Breast, AJCC 8th Edition - Pathologic stage from 01/08/2019: Stage IA (pT1c, pN0, cM0, G3, ER+, PR+, HER2-) - Signed by Truitt Merle, MD  on 01/22/2019   01/29/2019 Genetic Testing   Negative genetic testing on the common hereditary cancer panel.  The Common Hereditary Gene Panel offered by [facility] includes sequencing and/or deletion duplication testing of the following 48 genes: APC, ATM, AXIN2,  BARD1, BMPR1A, BRCA1, BRCA2, BRIP1, CDH1, CDK4, CDKN2A (p14ARF), CDKN2A (p16INK4a), CHEK2, CTNNA1, DICER1, EPCAM (Deletion/duplication testing only), GREM1 (promoter region deletion/duplication testing only), KIT, MEN1, MLH1, MSH2, MSH3, MSH6, MUTYH, NBN, NF1, NHTL1, PALB2, PDGFRA, PMS2, POLD1, POLE, PTEN, RAD50, RAD51C, RAD51D, RNF43, SDHB, SDHC, SDHD, SMAD4, SMARCA4. STK11, TP53, TSC1, TSC2, and VHL.  The following genes were evaluated for sequence changes only: SDHA and HOXB13 c.251G>A variant only. The report date is January 29, 2019.   03/01/2019 Surgery   RE-EXCISION OF LEFT BREAST LUMPECTOMY MULITPLE MARGINS by Dr. [name]  Diagnosis 1. Breast, excision, re-excision left anterior margin - BENIGN BREAST PARENCHYMA WITH PREVIOUS PROCEDURE-RELATED CHANGES - NEGATIVE FOR IN SITU OR INVASIVE CARCINOMA 2. 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 SEE NOTE - DCIS IS FOCALLY LESS THAN 1 MM FROM NEW INFERIOR MARGIN - NEGATIVE FOR INVASIVE CARCINOMA - PREVIOUS PROCEDURE-RELATED CHANGES 3. Breast, excision, re-excision left medial margin - FOCAL DUCTAL CARCINOMA IN SITU (DCIS), 0.2 CM. SEE NOTE - DCIS IS 1.5 MM FROM NEW MEDIAL MARGIN - NEGATIVE FOR INVASIVE CARCINOMA - PREVIOUS PROCEDURE-RELATED CHANGES 4. Breast, excision, re-excision left final medial argin - BENIGN BREAST PARENCHYMA WITH MILD FOCAL PREVIOUS PROCEDURE-RELATED CHANGES - NEGATIVE FOR IN SITU OR INVASIVE CARCINOMA    03/22/2019 - 05/24/2019 Chemotherapy   TC q3weeks for 4 cycles starting 03/22/19 and completed on 05/24/19    06/24/2019 - 08/09/2019 Radiation Therapy   Radiation therapy with Dr. Lisbeth Renshaw 06/24/19-08/09/19   08/2019 -  Anti-estrogen oral therapy   Letrozole 2.41m starting 08/2019   10/23/2019 Survivorship   SCP delivered by LCira Rue NP       CURRENT THERAPY:  Letrozole 2.536mstarting 08/2019  INTERVAL HISTORY:  Emma Casey here for a follow up of  breast cancer. She was last seen by me 6 months ago and seen by NP [name] in interim. She presents to the clinic alone. She notes she is doing well. She notes her hair has grown back. She feels she recovered well from chemo. She still has left breast pain. She has done PT. She plans to have massages soon due to left lymphedema of her breast and arm.  She notes trouble sleeping. She is able to fall asleep but not able to stay asleep. She has not had issues sleeping prior to cancer. She does have Gabapentin 10069mith 3 tabs at night and 3 tabs in the morning. She notes she is tolerating Letrozole well. She does feel hot and cold at night. She notes she was able to lose weight before on an egg diet.    REVIEW OF SYSTEMS:   Constitutional: Denies fevers, chills or abnormal weight loss (+) Trouble sleeping (+) PM hot flashes Eyes: Denies blurriness of vision Ears, nose, mouth, throat, and face: Denies mucositis or sore throat Respiratory: Denies cough, dyspnea or wheezes Cardiovascular: Denies palpitation, chest discomfort or lower extremity swelling Gastrointestinal:  Denies nausea, heartburn or change in bowel habits Skin: Denies abnormal skin rashes Lymphatics: Denies new lymphadenopathy or easy bruising Neurological:Denies numbness, tingling or new weaknesses Behavioral/Psych: Mood is stable, no new changes  Breast: (+) Left breast pain (+) Left breast and arm lymphedema  All other systems were reviewed with the patient  and are negative.  MEDICAL HISTORY:  Past Medical History:  Diagnosis Date  . Anxiety   . Arthritis    right hand  . Bronchitis   . Cancer ([place]) 11/2018   left breast ca  . Depression   . Diabetes mellitus without complication ([place])   . Family history of breast cancer   . GERD (gastroesophageal reflux disease)   . Headache   . Heart murmur   . Herpes 1&2   . Hypertension   . Leg fracture, left    as a child  . OSA on CPAP    has CPAP but has not used in over a  month    SURGICAL HISTORY: Past Surgical History:  Procedure Laterality Date  . ABDOMINAL HYSTERECTOMY    . BREAST LUMPECTOMY WITH RADIOACTIVE SEED AND SENTINEL LYMPH NODE BIOPSY Left 01/08/2019   Procedure: LEFT BREAST LUMPECTOMY WITH RADIOACTIVE SEED AND LEFT AXILLARY DEEP SENTINEL LYMPH NODE BIOPSY WITH BLUE DYE INJECTION;  Surgeon: Fanny Skates, MD;  Location: [facility];  Service: General;  Laterality: Left;  . COLONOSCOPY N/A 09/02/2015   Procedure: COLONOSCOPY;  Surgeon: Danie Binder, MD;  [facility];  Service: Endoscopy;  Laterality: N/A;  11:15 Am  . EYE SURGERY Right    cataract  . HAND SURGERY Right    broken bone  . PORT-A-CATH REMOVAL Right 08/21/2019   Procedure: REMOVAL PORT-A-CATH;  Surgeon: Fanny Skates, MD;  Location: [facility];  Service: General;  Laterality: Right;  . PORTACATH PLACEMENT N/A 03/01/2019   Procedure: INSERTION PORT-A-CATH;  Surgeon: Fanny Skates, MD;  Location: [facility];  Service: General;  Laterality: N/A;  . RE-EXCISION OF BREAST LUMPECTOMY Left 03/01/2019   Procedure: RE-EXCISION OF LEFT BREAST LUMPECTOMY MULITPLE MARGINS;  Surgeon: Fanny Skates, MD;  Location: [facility];  Service: General;  Laterality: Left;  . TUBAL LIGATION      I have reviewed the social history and family history with the patient and they are unchanged from previous note.  ALLERGIES:  is allergic to asa [aspirin]; nsaids; and penicillins.  MEDICATIONS:  Current Outpatient Medications  Medication Sig Dispense Refill  . acyclovir (ZOVIRAX) 400 MG tablet Take 400 mg by mouth 2 (two) times daily.    Marland Kitchen amLODipine (NORVASC) 5 MG tablet Take 5 mg by mouth daily.    . Ascorbic Acid (VITAMIN C) 1000 MG tablet Take 1,000 mg by mouth daily.    Marland Kitchen atorvastatin (LIPITOR) 20 MG tablet Take 20 mg by mouth daily.    . Cholecalciferol (VITAMIN D) 50 MCG (2000 UT) CAPS Take 2,000 Units by mouth daily.    . fluticasone (FLONASE) 50 MCG/ACT nasal  spray Place 2 sprays into both nostrils daily.     . folic acid (FOLVITE) 888 MCG tablet Take 400 mcg by mouth daily.     Marland Kitchen gabapentin (NEURONTIN) 300 MG capsule Take 1 capsule (300 mg total) by mouth 2 (two) times daily. 90 capsule 1  . lactulose (CHRONULAC) 10 GM/15ML solution Take 15 mLs (10 g total) by mouth 2 (two) times daily. 236 mL 0  . letrozole (FEMARA) 2.5 MG tablet Take 1 tablet (2.5 mg total) by mouth daily. 90 tablet 3  . lidocaine-prilocaine (EMLA) cream Apply to affected area once 30 g 3  . lisinopril (ZESTRIL) 2.5 MG tablet Take 2.5 mg by mouth at bedtime.    Marland Kitchen loratadine (CLARITIN) 10 MG tablet Take 10 mg by mouth daily.    . metFORMIN (GLUCOPHAGE-XR) 500  MG 24 hr tablet     . montelukast (SINGULAIR) 10 MG tablet Take 10 mg by mouth at bedtime.    Marland Kitchen omeprazole (PRILOSEC) 20 MG capsule Take 20 mg by mouth daily.    . ondansetron (ZOFRAN) 8 MG tablet Take 1 tablet (8 mg total) by mouth 2 (two) times daily as needed for refractory nausea / vomiting. Start on day 3 after chemo. (Patient taking differently: Take 8 mg by mouth 2 (two) times daily as needed for refractory nausea / vomiting. Start on day 3 after chemo.) 30 tablet 1  . PARoxetine (PAXIL) 10 MG tablet Take 10 mg by mouth at bedtime.    . potassium chloride (MICRO-K) 10 MEQ CR capsule Take 10 mEq by mouth 2 (two) times daily.    . prochlorperazine (COMPAZINE) 10 MG tablet Take 1 tablet (10 mg total) by mouth every 6 (six) hours as needed (Nausea or vomiting). 30 tablet 1  . tiZANidine (ZANAFLEX) 4 MG tablet Take 4 mg by mouth 3 (three) times daily.    Marland Kitchen topiramate (TOPAMAX) 100 MG tablet Take 100 mg by mouth daily.    . vitamin B-12 (CYANOCOBALAMIN) 500 MCG tablet Take 500 mcg by mouth daily.      No current facility-administered medications for this visit.    PHYSICAL EXAMINATION: ECOG PERFORMANCE STATUS: 1 - Symptomatic but completely ambulatory  Vitals:   01/22/20 1039  BP: (!) 124/98  Pulse: 68  Resp: 17    Temp: 98.1 F (36.7 C)  SpO2: 100%   Filed Weights   01/22/20 1039  Weight: 260 lb 4.8 oz (118.1 kg)    GENERAL:alert, no distress and comfortable SKIN: skin color, texture, turgor are normal, no rashes or significant lesions EYES: normal, Conjunctiva are pink and non-injected, sclera clear  NECK: supple, thyroid normal size, non-tender, without nodularity LYMPH:  no palpable lymphadenopathy in the cervical, axillary  LUNGS: clear to auscultation and percussion with normal breathing effort HEART: regular rate & rhythm and no murmurs and no lower extremity edema ABDOMEN:abdomen soft, non-tender and normal bowel sounds Musculoskeletal:no cyanosis of digits and no clubbing  NEURO: alert & oriented x 3 with fluent speech, no focal motor/sensory deficits BREAST: S/p left lumpectomy: Surgical incision healed well with mild scar tissue (+) Skin hyperpigmentation and lymphedema of left breast. Right Breast exam benign.   LABORATORY DATA:  I have reviewed the data as listed CBC Latest Ref Rng & Units 01/22/2020 07/24/2019 05/24/2019  WBC 4.0 - 10.5 K/uL 6.4 4.9 8.1  Hemoglobin 12.0 - 15.0 g/dL 12.7 11.7(L) 10.6(L)  Hematocrit 36.0 - 46.0 % 41.8 37.6 33.8(L)  Platelets 150 - 400 K/uL 255 268 361     CMP Latest Ref Rng & Units 01/22/2020 07/24/2019 05/24/2019  Glucose 70 - 99 mg/dL 199(H) 208(H) 183(H)  BUN 6 - 20 mg/dL 9 9 11   Creatinine 0.44 - 1.00 mg/dL 1.35(H) 1.08(H) 1.05(H)  Sodium 135 - 145 mmol/L 141 142 139  Potassium 3.5 - 5.1 mmol/L 3.7 3.3(L) 3.6  Chloride 98 - 111 mmol/L 106 105 107  CO2 22 - 32 mmol/L 26 28 26   Calcium 8.9 - 10.3 mg/dL 9.2 9.0 8.7(L)  Total Protein 6.5 - 8.1 g/dL 6.7 6.1(L) 5.4(L)  Total Bilirubin 0.3 - 1.2 mg/dL 0.5 0.7 0.8  Alkaline Phos 38 - 126 U/L 89 59 55  AST 15 - 41 U/L 10(L) 9(L) 9(L)  ALT 0 - 44 U/L 10 8 8       RADIOGRAPHIC STUDIES: I have personally  reviewed the radiological images as listed and agreed with the findings in the report. No  results found.   ASSESSMENT & PLAN:  Jim Philemon is a 59 y.o. female with    1.Malignant neoplasm of upper-inner quadrant of left breast,pT1cN0,stage I,ER/PR+,HER2-, GradeIII, Oncotype RS 27 -She was diagnosed in 11/2018. She underwentleftbreastlumpectomyand SLN biopsyon 01/08/19. Given positive margins she underwent left re-excision on 03/01/19.Oncotype showed RS 27, high risk.  -To reduce her risk of recurrence she completed 4 cycles of TC in 05/2019 and adjuvant Radiation.  -To reduce her risk of distant cancer, I started her on antiestrogen therapy with letrozole in 08/2019.  -She has recovered well from treatment. She does have left breast pain and lymphedema of her left arm and breast.  -She has been tolerating Letrozole well with mild hot flashes at night. She also notes trouble sleeping which could be related. I discussed increasing her Gabapentin to 351m in the AM and 6047mat night to help her sleep, hot flashes and her breast pain. She is agreeable.  -From a breast cancer standpoint she is clinically doing well. Lab reviewed, her CBC and CMP are within normal limits except BG 199, Cr 1.15. Her physical exam was unremarkable. There is no clinical concern for recurrence. [facility]02[facility], I will obtain records.  -Continue surveillance. Continue Letrozole  -F/u in 6 months, she will see her surgeon in 3 months   2. HTN, DMwith hyperglycemia, Obesity -On Amlodipine, lisinopril, metformin 3 tabs once daily.   -HTN controlled and poorly controlled hyperglycemia.  -Her BG is 199 today, fasting (01/22/20). I discussed Letrozole can slow down her metabolism. I recommend low carb diet with more protein and vegetables, work on weight loss, check her BG before breakfast and 1-2 hours after lunch or dinner. I also recommend she f/u with her PCP for better management.  -I offered her the chance to attend [facility] weight management clinic.  She is agreeable, I will refer her.   3.CKD stage III -secondary to DM and HTN.  -I encouraged her to drink plenty of water and to avoid NSAID's -Will monitor   4. Smoking Cessation  -She smoked for 10 years before she quit. She restarted after recent cancer diagnosis  -I strongly encouraged her to quit smoking completely and continue cessation. She understands and agrees. -SAlicia Amelas significantly reduced smoking. I encouraged her to continue until she quits completely.   5. Genetic Testingwasnegativefor pathogenetic mutations  6. B/l leg pain, neuropathy of feet, G1 -Likely secondary to chemo  -She notes in the past month she has had burning/tingling sensations of her feet from chemo. On Gabapentin.  -due to recent trouble sleeping, Left breast pain and hot flashes, she will increase her Gabapentin to 30069mn the AM and 600m69m the PM  7. Left breast pain, Left breast and arm lymphedema  -Secondary to surgery and RT  -She has completed PT. She plans to start getting massages  -She also has left breast pain and sensitivity. She will increase her Gabapentin to 300mg67mthe AM and 600mg 78mhe PM  PLAN: -Continue Letrozole  -I called in 300mg G29mentin to take 300mg in62m AM and 600mg in 76mPM.   -Refer to [facility] weight and management clinic for weight loss  -F/u with PCP soon  -Lab and F/u in 6 months, she will see her surgeon in 3 months -Obtain Mammogram and DEXA results form [facility]  No problem-specific Assessment & Plan notes found for this encounter.   Orders Placed This Encounter  Procedures  . Amb Ref to Medical Weight Management    Referral Priority:   Routine    Referral Type:   Consultation    Number of Visits Requested:   1   All questions were answered. The patient knows to call the clinic with any problems, questions or concerns. No barriers to learning was detected.      Truitt Merle, MD 01/22/2020   I, Joslyn Devon, am acting  as scribe for Truitt Merle, MD.   I have reviewed the above documentation for accuracy and completeness, and I agree with the above.

## 2020-01-22 ENCOUNTER — Other Ambulatory Visit: Payer: Self-pay

## 2020-01-22 ENCOUNTER — Inpatient Hospital Stay (HOSPITAL_BASED_OUTPATIENT_CLINIC_OR_DEPARTMENT_OTHER): Payer: Medicare Other | Admitting: Hematology

## 2020-01-22 ENCOUNTER — Encounter: Payer: Self-pay | Admitting: Hematology

## 2020-01-22 ENCOUNTER — Inpatient Hospital Stay: Payer: Medicare Other | Attending: Hematology

## 2020-01-22 VITALS — BP 124/98 | HR 68 | Temp 98.1°F | Resp 17 | Ht 66.0 in | Wt 260.3 lb

## 2020-01-22 DIAGNOSIS — Z170 Estrogen receptor positive status [ER+]: Secondary | ICD-10-CM

## 2020-01-22 DIAGNOSIS — I129 Hypertensive chronic kidney disease with stage 1 through stage 4 chronic kidney disease, or unspecified chronic kidney disease: Secondary | ICD-10-CM | POA: Insufficient documentation

## 2020-01-22 DIAGNOSIS — Z923 Personal history of irradiation: Secondary | ICD-10-CM | POA: Insufficient documentation

## 2020-01-22 DIAGNOSIS — Z803 Family history of malignant neoplasm of breast: Secondary | ICD-10-CM | POA: Diagnosis not present

## 2020-01-22 DIAGNOSIS — Z90710 Acquired absence of both cervix and uterus: Secondary | ICD-10-CM | POA: Insufficient documentation

## 2020-01-22 DIAGNOSIS — C50212 Malignant neoplasm of upper-inner quadrant of left female breast: Secondary | ICD-10-CM | POA: Diagnosis present

## 2020-01-22 DIAGNOSIS — E1122 Type 2 diabetes mellitus with diabetic chronic kidney disease: Secondary | ICD-10-CM | POA: Insufficient documentation

## 2020-01-22 DIAGNOSIS — N183 Chronic kidney disease, stage 3 unspecified: Secondary | ICD-10-CM | POA: Diagnosis not present

## 2020-01-22 DIAGNOSIS — F329 Major depressive disorder, single episode, unspecified: Secondary | ICD-10-CM | POA: Insufficient documentation

## 2020-01-22 DIAGNOSIS — Z9221 Personal history of antineoplastic chemotherapy: Secondary | ICD-10-CM | POA: Insufficient documentation

## 2020-01-22 DIAGNOSIS — F419 Anxiety disorder, unspecified: Secondary | ICD-10-CM | POA: Insufficient documentation

## 2020-01-22 DIAGNOSIS — Z79811 Long term (current) use of aromatase inhibitors: Secondary | ICD-10-CM | POA: Insufficient documentation

## 2020-01-22 DIAGNOSIS — Z79899 Other long term (current) drug therapy: Secondary | ICD-10-CM | POA: Insufficient documentation

## 2020-01-22 LAB — CMP (CANCER CENTER ONLY)
ALT: 10 U/L (ref 0–44)
AST: 10 U/L — ABNORMAL LOW (ref 15–41)
Albumin: 3.6 g/dL (ref 3.5–5.0)
Alkaline Phosphatase: 89 U/L (ref 38–126)
Anion gap: 9 (ref 5–15)
BUN: 9 mg/dL (ref 6–20)
CO2: 26 mmol/L (ref 22–32)
Calcium: 9.2 mg/dL (ref 8.9–10.3)
Chloride: 106 mmol/L (ref 98–111)
Creatinine: 1.35 mg/dL — ABNORMAL HIGH (ref 0.44–1.00)
GFR, Est AFR Am: 50 mL/min — ABNORMAL LOW (ref >60)
GFR, Estimated: 43 mL/min — ABNORMAL LOW (ref >60)
Glucose, Bld: 199 mg/dL — ABNORMAL HIGH (ref 70–99)
Potassium: 3.7 mmol/L (ref 3.5–5.1)
Sodium: 141 mmol/L (ref 135–145)
Total Bilirubin: 0.5 mg/dL (ref 0.3–1.2)
Total Protein: 6.7 g/dL (ref 6.5–8.1)

## 2020-01-22 LAB — CBC WITH DIFFERENTIAL (CANCER CENTER ONLY)
Abs Immature Granulocytes: 0.02 10*3/uL (ref 0.00–0.07)
Basophils Absolute: 0 10*3/uL (ref 0.0–0.1)
Basophils Relative: 0 %
Eosinophils Absolute: 0.2 10*3/uL (ref 0.0–0.5)
Eosinophils Relative: 3 %
HCT: 41.8 % (ref 36.0–46.0)
Hemoglobin: 12.7 g/dL (ref 12.0–15.0)
Immature Granulocytes: 0 %
Lymphocytes Relative: 26 %
Lymphs Abs: 1.7 10*3/uL (ref 0.7–4.0)
MCH: 29.5 pg (ref 26.0–34.0)
MCHC: 30.4 g/dL (ref 30.0–36.0)
MCV: 97.2 fL (ref 80.0–100.0)
Monocytes Absolute: 0.3 10*3/uL (ref 0.1–1.0)
Monocytes Relative: 5 %
Neutro Abs: 4.1 10*3/uL (ref 1.7–7.7)
Neutrophils Relative %: 66 %
Platelet Count: 255 10*3/uL (ref 150–400)
RBC: 4.3 MIL/uL (ref 3.87–5.11)
RDW: 14.6 % (ref 11.5–15.5)
WBC Count: 6.4 10*3/uL (ref 4.0–10.5)
nRBC: 0 % (ref 0.0–0.2)

## 2020-01-22 MED ORDER — GABAPENTIN 300 MG PO CAPS: 300.0000 mg | ORAL_CAPSULE | Freq: Two times a day (BID) | ORAL | 1 refills | Status: DC

## 2020-01-23 ENCOUNTER — Telehealth: Payer: Self-pay

## 2020-01-23 ENCOUNTER — Telehealth: Payer: Self-pay | Admitting: Hematology

## 2020-01-23 NOTE — Telephone Encounter (Signed)
Faxed request for most recent bone scan and mammogram to [facility] at [phone]

## 2020-01-23 NOTE — Telephone Encounter (Signed)
Scheduled appt per 6/2 los.  Left a vm of the appt date and time. 

## 2020-01-24 ENCOUNTER — Telehealth: Payer: Self-pay

## 2020-01-24 NOTE — Telephone Encounter (Signed)
I spoke with Ms Cocker and let her know her dexa bone scan was normal.  She verbalized understanding

## 2020-03-11 ENCOUNTER — Ambulatory Visit (INDEPENDENT_AMBULATORY_CARE_PROVIDER_SITE_OTHER): Payer: Self-pay | Admitting: Family Medicine

## 2020-03-13 ENCOUNTER — Other Ambulatory Visit: Payer: Self-pay | Admitting: Hematology

## 2020-03-25 ENCOUNTER — Ambulatory Visit (INDEPENDENT_AMBULATORY_CARE_PROVIDER_SITE_OTHER): Payer: Self-pay | Admitting: Family Medicine

## 2020-03-25 ENCOUNTER — Ambulatory Visit (INDEPENDENT_AMBULATORY_CARE_PROVIDER_SITE_OTHER): Payer: Medicare Other | Admitting: Family Medicine

## 2020-04-08 ENCOUNTER — Ambulatory Visit (INDEPENDENT_AMBULATORY_CARE_PROVIDER_SITE_OTHER): Payer: Medicare Other | Admitting: Family Medicine

## 2020-04-15 ENCOUNTER — Other Ambulatory Visit: Payer: Self-pay

## 2020-04-15 ENCOUNTER — Encounter (INDEPENDENT_AMBULATORY_CARE_PROVIDER_SITE_OTHER): Payer: Self-pay | Admitting: Family Medicine

## 2020-04-15 ENCOUNTER — Ambulatory Visit (INDEPENDENT_AMBULATORY_CARE_PROVIDER_SITE_OTHER): Payer: Medicare Other | Admitting: Family Medicine

## 2020-04-15 VITALS — BP 149/82 | HR 61 | Temp 98.0°F | Ht 65.0 in | Wt 261.0 lb

## 2020-04-15 DIAGNOSIS — Z6841 Body Mass Index (BMI) 40.0 and over, adult: Secondary | ICD-10-CM

## 2020-04-15 DIAGNOSIS — R5383 Other fatigue: Secondary | ICD-10-CM

## 2020-04-15 DIAGNOSIS — Z0289 Encounter for other administrative examinations: Secondary | ICD-10-CM

## 2020-04-15 DIAGNOSIS — I10 Essential (primary) hypertension: Secondary | ICD-10-CM

## 2020-04-15 DIAGNOSIS — E785 Hyperlipidemia, unspecified: Secondary | ICD-10-CM

## 2020-04-15 DIAGNOSIS — R0602 Shortness of breath: Secondary | ICD-10-CM | POA: Diagnosis not present

## 2020-04-15 DIAGNOSIS — G4733 Obstructive sleep apnea (adult) (pediatric): Secondary | ICD-10-CM

## 2020-04-15 DIAGNOSIS — E66813 Obesity, class 3: Secondary | ICD-10-CM

## 2020-04-15 DIAGNOSIS — Z1331 Encounter for screening for depression: Secondary | ICD-10-CM

## 2020-04-15 DIAGNOSIS — E118 Type 2 diabetes mellitus with unspecified complications: Secondary | ICD-10-CM

## 2020-04-15 DIAGNOSIS — E559 Vitamin D deficiency, unspecified: Secondary | ICD-10-CM

## 2020-04-16 LAB — CBC WITH DIFFERENTIAL/PLATELET
Basophils Absolute: 0 10*3/uL (ref 0.0–0.2)
Basos: 1 %
EOS (ABSOLUTE): 0.1 10*3/uL (ref 0.0–0.4)
Eos: 2 %
Hematocrit: 44.4 % (ref 34.0–46.6)
Hemoglobin: 14 g/dL (ref 11.1–15.9)
Immature Grans (Abs): 0 10*3/uL (ref 0.0–0.1)
Immature Granulocytes: 0 %
Lymphocytes Absolute: 1.6 10*3/uL (ref 0.7–3.1)
Lymphs: 21 %
MCH: 29.7 pg (ref 26.6–33.0)
MCHC: 31.5 g/dL (ref 31.5–35.7)
MCV: 94 fL (ref 79–97)
Monocytes Absolute: 0.4 10*3/uL (ref 0.1–0.9)
Monocytes: 5 %
Neutrophils Absolute: 5.5 10*3/uL (ref 1.4–7.0)
Neutrophils: 71 %
Platelets: 276 10*3/uL (ref 150–450)
RBC: 4.71 x10E6/uL (ref 3.77–5.28)
RDW: 13.1 % (ref 11.7–15.4)
WBC: 7.7 10*3/uL (ref 3.4–10.8)

## 2020-04-16 LAB — COMPREHENSIVE METABOLIC PANEL
ALT: 11 IU/L (ref 0–32)
AST: 13 IU/L (ref 0–40)
Albumin/Globulin Ratio: 1.7 (ref 1.2–2.2)
Albumin: 4.7 g/dL (ref 3.8–4.9)
Alkaline Phosphatase: 113 IU/L (ref 48–121)
BUN/Creatinine Ratio: 6 — ABNORMAL LOW (ref 9–23)
BUN: 7 mg/dL (ref 6–24)
Bilirubin Total: 0.8 mg/dL (ref 0.0–1.2)
CO2: 25 mmol/L (ref 20–29)
Calcium: 10 mg/dL (ref 8.7–10.2)
Chloride: 100 mmol/L (ref 96–106)
Creatinine, Ser: 1.24 mg/dL — ABNORMAL HIGH (ref 0.57–1.00)
GFR calc Af Amer: 55 mL/min/{1.73_m2} — ABNORMAL LOW (ref >59)
GFR calc non Af Amer: 48 mL/min/{1.73_m2} — ABNORMAL LOW (ref >59)
Globulin, Total: 2.7 g/dL (ref 1.5–4.5)
Glucose: 150 mg/dL — ABNORMAL HIGH (ref 65–99)
Potassium: 3.9 mmol/L (ref 3.5–5.2)
Sodium: 142 mmol/L (ref 134–144)
Total Protein: 7.4 g/dL (ref 6.0–8.5)

## 2020-04-16 LAB — T3: T3, Total: 126 ng/dL (ref 71–180)

## 2020-04-16 LAB — HEMOGLOBIN A1C
Est. average glucose Bld gHb Est-mCnc: 192 mg/dL
Hgb A1c MFr Bld: 8.3 % — ABNORMAL HIGH (ref 4.8–5.6)

## 2020-04-16 LAB — TSH: TSH: 1.23 u[IU]/mL (ref 0.450–4.500)

## 2020-04-16 LAB — LIPID PANEL WITH LDL/HDL RATIO
Cholesterol, Total: 181 mg/dL (ref 100–199)
HDL: 48 mg/dL (ref >39)
LDL Chol Calc (NIH): 111 mg/dL — ABNORMAL HIGH (ref 0–99)
LDL/HDL Ratio: 2.3 ratio (ref 0.0–3.2)
Triglycerides: 124 mg/dL (ref 0–149)
VLDL Cholesterol Cal: 22 mg/dL (ref 5–40)

## 2020-04-16 LAB — INSULIN, RANDOM: INSULIN: 24.5 u[IU]/mL (ref 2.6–24.9)

## 2020-04-16 LAB — VITAMIN D 25 HYDROXY (VIT D DEFICIENCY, FRACTURES): Vit D, 25-Hydroxy: 27.6 ng/mL — ABNORMAL LOW (ref 30.0–100.0)

## 2020-04-16 LAB — T4: T4, Total: 8.4 ug/dL (ref 4.5–12.0)

## 2020-04-16 NOTE — Progress Notes (Signed)
Dear Truitt Merle, MD,   Thank you for referring [place] to our clinic. The following note includes my evaluation and treatment recommendations.  Chief Complaint:   OBESITY Amely Voorheis (MR# [id number]) is a 59 y.o. female who presents for evaluation and treatment of obesity and related comorbidities. Current BMI is Body mass index is 43.43 kg/m. Tobie has been struggling with her weight for many years and has been unsuccessful in either losing weight, maintaining weight loss, or reaching her healthy weight goal.  Audrea is currently in the action stage of change and ready to dedicate time achieving and maintaining a healthier weight. Cyenna is interested in becoming our patient and working on intensive lifestyle modifications including (but not limited to) diet and exercise for weight loss.  Alyshia's habits were reviewed today and are as follows: Her family eats meals together, her desired weight loss is 111 lbs, she has been heavy most of her life, her heaviest weight ever was 300 pounds, she is a picky eater and doesn't like to eat healthier foods, she snacks frequently in the evenings, she skips meals frequently and she is frequently drinking liquids with calories.  Depression Screen Elinda's Food and Mood (modified PHQ-9) score was 7.  Depression screen [facility] 2/9 04/15/2020  Decreased Interest 0  Down, Depressed, Hopeless 0  PHQ - 2 Score 0  Altered sleeping 1  Tired, decreased energy 2  Change in appetite 0  Feeling bad or failure about yourself  0  Trouble concentrating 3  Moving slowly or fidgety/restless 1  Suicidal thoughts 0  PHQ-9 Score 7  Difficult doing work/chores Not difficult at all   Subjective:   1. Other fatigue Zeena admits to daytime somnolence and admits to waking up still tired. Patent has a history of symptoms of daytime fatigue and morning headache. Ayomide generally gets 5 hours of sleep per night, and states that she has nightime awakenings. Snoring is present.  Apneic episodes are present. Epworth Sleepiness Score is 21. EKG-sinus bradycardia at 58 BPM.  2. SOB (shortness of breath) on exertion Tatia notes increasing shortness of breath with exercising and seems to be worsening over time with weight gain. She notes getting out of breath sooner with activity than she used to. This has not gotten worse recently. Madell denies shortness of breath at rest or orthopnea.  3. Type 2 diabetes mellitus with complication, without long-term current use of insulin ([place]) Takhia's last A1c was 7.1 on 11/27/2019. She is not on insulin. She was on glipizide previous, and she is on metformin 1500 mg daily. She had a urine test in April 2021.  4. Essential hypertension Aileena's blood pressure is slightly elevated today. She denies chest pain, chest pressure, or headache. She is on amlodipine and lisinopril.  5. Hyperlipidemia, unspecified hyperlipidemia type Leesha is on statin and she denies myalgias. Last LDL was in April 2021.  6. Vitamin D deficiency Livie has no Vit D level in Epic. She notes fatigue on OTC Vit D.  7. OSA (obstructive sleep apnea) Noralyn is waiting on a new face mask prescription. She has a CPAP machine at home but she is waiting for a new mask.  Assessment/Plan:   1. Other fatigue Rana does feel that her weight is causing her energy to be lower than it should be. Fatigue may be related to obesity, depression or many other causes. Labs will be ordered, and in the meanwhile, Tehila will focus on self care including making healthy food choices,  increasing physical activity and focusing on stress reduction.  - EKG 12-Lead - Comprehensive metabolic panel - TSH - T4 - T3  2. SOB (shortness of breath) on exertion Annabelle does feel that she gets out of breath more easily that she used to when she exercises. Assata's shortness of breath appears to be obesity related and exercise induced. She has agreed to work on weight loss and gradually increase exercise to treat  her exercise induced shortness of breath. Will continue to monitor closely.  - CBC with Differential/Platelet  3. Type 2 diabetes mellitus with complication, without long-term current use of insulin (HCC) Good blood sugar control is important to decrease the likelihood of diabetic complications such as nephropathy, neuropathy, limb loss, blindness, coronary artery disease, and death. Intensive lifestyle modification including diet, exercise and weight loss are the first line of treatment for diabetes. We will check labs today, and Fatin will continue to follow up as directed.  - Hemoglobin A1c - Insulin, random  4. Essential hypertension Kelcie is working on healthy weight loss and exercise to improve blood pressure control. We will watch for signs of hypotension as she continues her lifestyle modifications. We will follow up on her blood pressure at her next appointment.   5. Hyperlipidemia, unspecified hyperlipidemia type Cardiovascular risk and specific lipid/LDL goals reviewed. We discussed several lifestyle modifications today and Arion will continue to work on diet, exercise and weight loss efforts. We will check labs today. Orders and follow up as documented in patient record.   Counseling Intensive lifestyle modifications are the first line treatment for this issue. . Dietary changes: Increase soluble fiber. Decrease simple carbohydrates. . Exercise changes: Moderate to vigorous-intensity aerobic activity 150 minutes per week if tolerated. . Lipid-lowering medications: see documented in medical record.  - Lipid Panel With LDL/HDL Ratio  6. Vitamin D deficiency Low Vitamin D level contributes to fatigue and are associated with obesity, breast, and colon cancer. We will check labs today. Marypat will follow-up for routine testing of Vitamin D, at least 2-3 times per year to avoid over-replacement.  - VITAMIN D 25 Hydroxy (Vit-D Deficiency, Fractures)  7. OSA (obstructive sleep  apnea) Intensive lifestyle modifications are the first line treatment for this issue. We discussed several lifestyle modifications today. Marceline will continue to work on diet, exercise and weight loss efforts. We will continue to monitor. We will follow up at her next appointment. Orders and follow up as documented in patient record.   Counseling  Sleep apnea is a condition in which breathing pauses or becomes shallow during sleep. This happens over and over during the night. This disrupts your sleep and keeps your body from getting the rest that it needs, which can cause tiredness and lack of energy (fatigue) during the day.  Sleep apnea treatment: If you were given a device to open your airway while you sleep, USE IT!  Sleep hygiene:   Limit or avoid alcohol, caffeinated beverages, and cigarettes, especially close to bedtime.   Do not eat a large meal or eat spicy foods right before bedtime. This can lead to digestive discomfort that can make it hard for you to sleep.  Keep a sleep diary to help you and your health care provider figure out what could be causing your insomnia.  . Make your bedroom a dark, comfortable place where it is easy to fall asleep. ? Put up shades or blackout curtains to block light from outside. ? Use a white noise machine to block noise. ?  Keep the temperature cool. . Limit screen use before bedtime. This includes: ? Watching TV. ? Using your smartphone, tablet, or computer. . Stick to a routine that includes going to bed and waking up at the same times every day and night. This can help you fall asleep faster. Consider making a quiet activity, such as reading, part of your nighttime routine. . Try to avoid taking naps during the day so that you sleep better at night. . Get out of bed if you are still awake after 15 minutes of trying to sleep. Keep the lights down, but try reading or doing a quiet activity. When you feel sleepy, go back to bed.  8. Depression  screening Jaeleigh had a positive depression screening. Depression is commonly associated with obesity and often results in emotional eating behaviors. We will monitor this closely and work on CBT to help improve the non-hunger eating patterns. Referral to Psychology may be required if no improvement is seen as she continues in our clinic.  9. Class 3 severe obesity with serious comorbidity and body mass index (BMI) of 40.0 to 44.9 in adult, unspecified obesity type (HCC) Kaitlen is currently in the action stage of change and her goal is to continue with weight loss efforts. I recommend Mariyam begin the structured treatment plan as follows:  She has agreed to the Category 2 Plan + 100 calories.  Exercise goals: No exercise has been prescribed at this time.   Behavioral modification strategies: increasing lean protein intake, meal planning and cooking strategies and keeping healthy foods in the home.  She was informed of the importance of frequent follow-up visits to maximize her success with intensive lifestyle modifications for her multiple health conditions. She was informed we would discuss her lab results at her next visit unless there is a critical issue that needs to be addressed sooner. Senie agreed to keep her next visit at the agreed upon time to discuss these results.  Objective:   Blood pressure (!) 149/82, pulse 61, temperature 98 F (36.7 C), temperature source Oral, height 5\' 5"  (1.651 m), weight 261 lb (118.4 kg), SpO2 100 %. Body mass index is 43.43 kg/m.  EKG: Normal sinus rhythm, rate 58 BPM.  Indirect Calorimeter completed today shows a VO2 of 257 and a REE of 1791.  Her calculated basal metabolic rate is 5462 thus her basal metabolic rate is worse than expected.  General: Cooperative, alert, well developed, in no acute distress. HEENT: Conjunctivae and lids unremarkable. Cardiovascular: Regular rhythm.  Lungs: Normal work of breathing. Neurologic: No focal deficits.   Lab  Results  Component Value Date   CREATININE 1.24 (H) 04/15/2020   BUN 7 04/15/2020   NA 142 04/15/2020   K 3.9 04/15/2020   CL 100 04/15/2020   CO2 25 04/15/2020   Lab Results  Component Value Date   ALT 11 04/15/2020   AST 13 04/15/2020   ALKPHOS 113 04/15/2020   BILITOT 0.8 04/15/2020   Lab Results  Component Value Date   HGBA1C 8.3 (H) 04/15/2020   HGBA1C 8.8 (H) 05/03/2019   HGBA1C 7.3 (H) 02/21/2019   Lab Results  Component Value Date   INSULIN 24.5 04/15/2020   Lab Results  Component Value Date   TSH 1.230 04/15/2020   Lab Results  Component Value Date   CHOL 181 04/15/2020   HDL 48 04/15/2020   LDLCALC 111 (H) 04/15/2020   TRIG 124 04/15/2020   Lab Results  Component Value Date   WBC  7.7 04/15/2020   HGB 14.0 04/15/2020   HCT 44.4 04/15/2020   MCV 94 04/15/2020   PLT 276 04/15/2020   No results found for: IRON, TIBC, FERRITIN Obesity Behavioral Intervention Visit Documentation for Insurance:   Approximately 15 minutes were spent on the discussion below.  ASK: We discussed the diagnosis of obesity with Azalia today and Lunah agreed to give [place] permission to discuss obesity behavioral modification therapy today.  ASSESS: Jakyiah has the diagnosis of obesity and her BMI today is 43.43. Shelene is in the action stage of change.   ADVISE: Tayana was educated on the multiple health risks of obesity as well as the benefit of weight loss to improve her health. She was advised of the need for long term treatment and the importance of lifestyle modifications to improve her current health and to decrease her risk of future health problems.  AGREE: Multiple dietary modification options and treatment options were discussed and Kainat agreed to follow the recommendations documented in the above note.  ARRANGE: Chisa was educated on the importance of frequent visits to treat obesity as outlined per CMS and USPSTF guidelines and agreed to schedule her next follow up appointment  today.  Attestation Statements:   This is the patient's first visit at [facility]. The patient's NEW PATIENT PACKET was reviewed at length. Included in the packet: current and past health history, medications, allergies, ROS, gynecologic history (women only), surgical history, family history, social history, weight history, weight loss surgery history (for those that have had weight loss surgery), nutritional evaluation, mood and food questionnaire, PHQ9, Epworth questionnaire, sleep habits questionnaire, patient life and health improvement goals questionnaire. These will all be scanned into the patient's chart under media.   During the visit, I independently reviewed the patient's EKG, bioimpedance scale results, and indirect calorimeter results. I used this information to tailor a meal plan for the patient that will help her to lose weight and will improve her obesity-related conditions going forward. I performed a medically necessary appropriate examination and/or evaluation. I discussed the assessment and treatment plan with the patient. The patient was provided an opportunity to ask questions and all were answered. The patient agreed with the plan and demonstrated an understanding of the instructions. Labs were ordered at this visit and will be reviewed at the next visit unless more critical results need to be addressed immediately. Clinical information was updated and documented in the EMR.   Time spent on visit including pre-visit chart review and post-visit care was 45 minutes.   A separate 15 minutes was spent on risk counseling (see above).    I, Trixie Dredge, am acting as transcriptionist for Coralie Common, MD.  I have reviewed the above documentation for accuracy and completeness, and I agree with the above. - Jinny Blossom, MD

## 2020-04-29 ENCOUNTER — Other Ambulatory Visit: Payer: Self-pay

## 2020-04-29 ENCOUNTER — Encounter (INDEPENDENT_AMBULATORY_CARE_PROVIDER_SITE_OTHER): Payer: Self-pay | Admitting: Family Medicine

## 2020-04-29 ENCOUNTER — Ambulatory Visit (INDEPENDENT_AMBULATORY_CARE_PROVIDER_SITE_OTHER): Payer: Medicare Other | Admitting: Family Medicine

## 2020-04-29 VITALS — BP 136/88 | HR 73 | Temp 98.5°F | Ht 65.0 in | Wt 254.0 lb

## 2020-04-29 DIAGNOSIS — E559 Vitamin D deficiency, unspecified: Secondary | ICD-10-CM

## 2020-04-29 DIAGNOSIS — E1169 Type 2 diabetes mellitus with other specified complication: Secondary | ICD-10-CM | POA: Diagnosis not present

## 2020-04-29 DIAGNOSIS — E785 Hyperlipidemia, unspecified: Secondary | ICD-10-CM | POA: Diagnosis not present

## 2020-04-29 DIAGNOSIS — E118 Type 2 diabetes mellitus with unspecified complications: Secondary | ICD-10-CM | POA: Diagnosis not present

## 2020-04-29 DIAGNOSIS — Z6841 Body Mass Index (BMI) 40.0 and over, adult: Secondary | ICD-10-CM | POA: Diagnosis not present

## 2020-04-29 MED ORDER — TRULICITY 0.75 MG/0.5ML ~~LOC~~ SOAJ: 0.7500 mg | SUBCUTANEOUS | 0 refills | Status: DC

## 2020-04-29 MED ORDER — VITAMIN D (ERGOCALCIFEROL) 1.25 MG (50000 UNIT) PO CAPS: 50000.0000 [IU] | ORAL_CAPSULE | ORAL | 0 refills | Status: DC

## 2020-04-30 NOTE — Progress Notes (Signed)
Chief Complaint:   OBESITY Emma Casey is here to discuss her progress with her obesity treatment plan along with follow-up of her obesity related diagnoses. Emma Casey is on the Category 2 Plan + 100 calories and states she is following her eating plan approximately 40% of the time. Emma Casey states she is walking and stairs for 30 minutes 7 times per week.  Today's visit was #: 2 Starting weight: 261 lbs Starting date: 04/15/2020 Today's weight: 254 lbs Today's date: 04/29/2020 Total lbs lost to date: 7 Total lbs lost since last in-office visit: 7  Interim History: Emma Casey voices that the first 2 weeks she has focused on walking up and down the steps more. She left her meal plan at her sister's house and tried to stick on the plan as much as she could remember. She did get the 45 calorie bread and scrambled an egg. She made her own roast beef sandwich for lunch.  Subjective:   1. Hyperlipidemia associated with type 2 diabetes mellitus ([place]) Emma Casey's last LDL was 111, HDL 48, and triglycerides 124. She is on atorvastatin. I discussed labs with the patient today.  2. Vitamin D deficiency Emma Casey is on 2,000 IU daily Vit D. Last Vit D level not at goal at 27.6. I discussed labs with the patient today.  3. Type 2 diabetes mellitus with complication, without long-term current use of insulin ([place]) Emma Casey is on 15000 mg  PO daily metformin. She was previously on glipizide but she stopped secondary to weight loss. I discussed labs with the patient today.  Assessment/Plan:   1. Hyperlipidemia associated with type 2 diabetes mellitus ([place]) Cardiovascular risk and specific lipid/LDL goals reviewed.  We discussed several lifestyle modifications today and Emma Casey will continue to work on diet, exercise and weight loss efforts. We will repeat labs in 3 months, if LDL is not at goal of <70 then we will discuss increasing statin. Orders and follow up as documented in patient record.   Counseling Intensive lifestyle  modifications are the first line treatment for this issue. . Dietary changes: Increase soluble fiber. Decrease simple carbohydrates. . Exercise changes: Moderate to vigorous-intensity aerobic activity 150 minutes per week if tolerated. . Lipid-lowering medications: see documented in medical record.  2. Vitamin D deficiency Low Vitamin D level contributes to fatigue and are associated with obesity, breast, and colon cancer. Emma Casey agreed to hold OTC Vit D, and start prescription Vitamin D 50,000 IU every week with no refills. She will follow-up for routine testing of Vitamin D, at least 2-3 times per year to avoid over-replacement.  - Vitamin D, Ergocalciferol, (DRISDOL) 1.25 MG (50000 UNIT) CAPS capsule; Take 1 capsule (50,000 Units total) by mouth every 7 (seven) days.  Dispense: 4 capsule; Refill: 0  3. Type 2 diabetes mellitus with complication, without long-term current use of insulin (HCC) Good blood sugar control is important to decrease the likelihood of diabetic complications such as nephropathy, neuropathy, limb loss, blindness, coronary artery disease, and death. Intensive lifestyle modification including diet, exercise and weight loss are the first line of treatment for diabetes. Emma Casey agreed to continue metformin, and start Trulicity 2.40 mg SubQ weekly with no refills.  - Dulaglutide (TRULICITY) 9.73 ZH/2.9JM SOPN; Inject 0.5 mLs (0.75 mg total) into the skin once a week.  Dispense: 2 mL; Refill: 0  4. Class 3 severe obesity with serious comorbidity and body mass index (BMI) of 40.0 to 44.9 in adult, unspecified obesity type (HCC) Emma Casey is currently in the action stage  of change. As such, her goal is to continue with weight loss efforts. She has agreed to the Category 2 Plan + 100 calories.   Exercise goals: No exercise has been prescribed at this time.  Behavioral modification strategies: increasing lean protein intake, increasing vegetables, meal planning and cooking strategies,  keeping healthy foods in the home and planning for success.  Emma Casey has agreed to follow-up with our clinic in 2 weeks. She was informed of the importance of frequent follow-up visits to maximize her success with intensive lifestyle modifications for her multiple health conditions.   Objective:   Blood pressure 136/88, pulse 73, temperature 98.5 F (36.9 C), temperature source Oral, height 5\' 5"  (1.651 m), weight 254 lb (115.2 kg), SpO2 97 %. Body mass index is 42.27 kg/m.  General: Cooperative, alert, well developed, in no acute distress. HEENT: Conjunctivae and lids unremarkable. Cardiovascular: Regular rhythm.  Lungs: Normal work of breathing. Neurologic: No focal deficits.   Lab Results  Component Value Date   CREATININE 1.24 (H) 04/15/2020   BUN 7 04/15/2020   NA 142 04/15/2020   K 3.9 04/15/2020   CL 100 04/15/2020   CO2 25 04/15/2020   Lab Results  Component Value Date   ALT 11 04/15/2020   AST 13 04/15/2020   ALKPHOS 113 04/15/2020   BILITOT 0.8 04/15/2020   Lab Results  Component Value Date   HGBA1C 8.3 (H) 04/15/2020   HGBA1C 8.8 (H) 05/03/2019   HGBA1C 7.3 (H) 02/21/2019   Lab Results  Component Value Date   INSULIN 24.5 04/15/2020   Lab Results  Component Value Date   TSH 1.230 04/15/2020   Lab Results  Component Value Date   CHOL 181 04/15/2020   HDL 48 04/15/2020   LDLCALC 111 (H) 04/15/2020   TRIG 124 04/15/2020   Lab Results  Component Value Date   WBC 7.7 04/15/2020   HGB 14.0 04/15/2020   HCT 44.4 04/15/2020   MCV 94 04/15/2020   PLT 276 04/15/2020   No results found for: IRON, TIBC, FERRITIN  Obesity Behavioral Intervention:   Approximately 15 minutes were spent on the discussion below.  ASK: We discussed the diagnosis of obesity with Emma Casey today and Emma Casey agreed to give [place] permission to discuss obesity behavioral modification therapy today.  ASSESS: Emma Casey has the diagnosis of obesity and her BMI today is 42.27. Emma Casey is in the  action stage of change.   ADVISE: Emma Casey was educated on the multiple health risks of obesity as well as the benefit of weight loss to improve her health. She was advised of the need for long term treatment and the importance of lifestyle modifications to improve her current health and to decrease her risk of future health problems.  AGREE: Multiple dietary modification options and treatment options were discussed and Emma Casey agreed to follow the recommendations documented in the above note.  ARRANGE: Emma Casey was educated on the importance of frequent visits to treat obesity as outlined per CMS and USPSTF guidelines and agreed to schedule her next follow up appointment today.  Attestation Statements:   Reviewed by clinician on day of visit: allergies, medications, problem list, medical history, surgical history, family history, social history, and previous encounter notes.   I, Trixie Dredge, am acting as transcriptionist for Coralie Common, MD.  I have reviewed the above documentation for accuracy and completeness, and I agree with the above. - Jinny Blossom, MD

## 2020-05-13 ENCOUNTER — Ambulatory Visit (INDEPENDENT_AMBULATORY_CARE_PROVIDER_SITE_OTHER): Payer: Medicare Other | Admitting: Family Medicine

## 2020-05-13 ENCOUNTER — Other Ambulatory Visit: Payer: Self-pay

## 2020-05-13 ENCOUNTER — Encounter (INDEPENDENT_AMBULATORY_CARE_PROVIDER_SITE_OTHER): Payer: Self-pay | Admitting: Family Medicine

## 2020-05-13 VITALS — BP 130/79 | HR 83 | Temp 98.7°F | Ht 65.0 in | Wt 260.0 lb

## 2020-05-13 DIAGNOSIS — I10 Essential (primary) hypertension: Secondary | ICD-10-CM

## 2020-05-13 DIAGNOSIS — I152 Hypertension secondary to endocrine disorders: Secondary | ICD-10-CM

## 2020-05-13 DIAGNOSIS — E1165 Type 2 diabetes mellitus with hyperglycemia: Secondary | ICD-10-CM | POA: Diagnosis not present

## 2020-05-13 DIAGNOSIS — E1159 Type 2 diabetes mellitus with other circulatory complications: Secondary | ICD-10-CM

## 2020-05-13 DIAGNOSIS — Z6841 Body Mass Index (BMI) 40.0 and over, adult: Secondary | ICD-10-CM

## 2020-05-17 NOTE — Progress Notes (Signed)
Chief Complaint:   OBESITY Emma Casey is here to discuss her progress with her obesity treatment plan along with follow-up of her obesity related diagnoses. Emma Casey is on the Category 2 Plan + 100 calories and states she is following her eating plan approximately 80% of the time. Emma Casey states she is walking up the stairs for 15-20 minutes 7 times per week.  Today's visit was #: 3 Starting weight: 261 lbs Starting date: 04/15/2020 Today's weight: 260 lbs Today's date: 05/13/2020 Total lbs lost to date: 1 Total lbs lost since last in-office visit: 0  Interim History: Emma Casey has been eating a little bit of everything. She has been eating a good amount of barbecue, cheese, and crackers, fruit, yogurt, and water. She is not consistently hungry. She reports she is weighing out food at lunch and dinner. Probably doing more of 6 oz of meat at dinner. She is doing pop corn, cheese and crackers for snacks.  Subjective:   1. Type 2 diabetes mellitus with hyperglycemia, without long-term current use of insulin (HCC) Emma Casey never started [employer]. She hasn't been checking her BGs. She denies hypoglycemia events or feelings of hypoglycemia.  2. Hypertension associated with diabetes ([place]) Emma Casey's blood pressure is well controlled. She denies chest pain, chest pressure, or headache. She is on amlodipine and lisinopril.  Assessment/Plan:   1. Type 2 diabetes mellitus with hyperglycemia, without long-term current use of insulin (HCC) Good blood sugar control is important to decrease the likelihood of diabetic complications such as nephropathy, neuropathy, limb loss, blindness, coronary artery disease, and death. Intensive lifestyle modification including diet, exercise and weight loss are the first line of treatment for diabetes. Emma Casey is to start Trulicity now, and will follow up as directed.  2. Hypertension associated with diabetes ([place]) Emma Casey is working on healthy weight loss and exercise to improve blood  pressure control. We will watch for signs of hypotension as she continues her lifestyle modifications. Emma Casey will continue her current medications, no change in dosage.  3. Class 3 severe obesity with serious comorbidity and body mass index (BMI) of 40.0 to 44.9 in adult, unspecified obesity type (HCC) Emma Casey is currently in the action stage of change. As such, her goal is to continue with weight loss efforts. She has agreed to the Category 2 Plan + 100 calories.   Exercise goals: All adults should avoid inactivity. Some physical activity is better than none, and adults who participate in any amount of physical activity gain some health benefits.  Behavioral modification strategies: increasing lean protein intake, meal planning and cooking strategies, keeping healthy foods in the home and planning for success.  Emma Casey has agreed to follow-up with our clinic in 2 weeks. She was informed of the importance of frequent follow-up visits to maximize her success with intensive lifestyle modifications for her multiple health conditions.   Objective:   Blood pressure 130/79, pulse 83, temperature 98.7 F (37.1 C), temperature source Oral, height 5\' 5"  (1.651 m), weight 260 lb (117.9 kg), SpO2 98 %. Body mass index is 43.27 kg/m.  General: Cooperative, alert, well developed, in no acute distress. HEENT: Conjunctivae and lids unremarkable. Cardiovascular: Regular rhythm.  Lungs: Normal work of breathing. Neurologic: No focal deficits.   Lab Results  Component Value Date   CREATININE 1.24 (H) 04/15/2020   BUN 7 04/15/2020   NA 142 04/15/2020   K 3.9 04/15/2020   CL 100 04/15/2020   CO2 25 04/15/2020   Lab Results  Component Value Date  ALT 11 04/15/2020   AST 13 04/15/2020   ALKPHOS 113 04/15/2020   BILITOT 0.8 04/15/2020   Lab Results  Component Value Date   HGBA1C 8.3 (H) 04/15/2020   HGBA1C 8.8 (H) 05/03/2019   HGBA1C 7.3 (H) 02/21/2019   Lab Results  Component Value Date   INSULIN  24.5 04/15/2020   Lab Results  Component Value Date   TSH 1.230 04/15/2020   Lab Results  Component Value Date   CHOL 181 04/15/2020   HDL 48 04/15/2020   LDLCALC 111 (H) 04/15/2020   TRIG 124 04/15/2020   Lab Results  Component Value Date   WBC 7.7 04/15/2020   HGB 14.0 04/15/2020   HCT 44.4 04/15/2020   MCV 94 04/15/2020   PLT 276 04/15/2020   No results found for: IRON, TIBC, FERRITIN  Attestation Statements:   Reviewed by clinician on day of visit: allergies, medications, problem list, medical history, surgical history, family history, social history, and previous encounter notes.  Time spent on visit including pre-visit chart review and post-visit care and charting was 15 minutes.    I, Trixie Dredge, am acting as transcriptionist for Coralie Common, MD.  I have reviewed the above documentation for accuracy and completeness, and I agree with the above. - Jinny Blossom, MD

## 2020-05-27 ENCOUNTER — Ambulatory Visit (INDEPENDENT_AMBULATORY_CARE_PROVIDER_SITE_OTHER): Payer: Medicare Other | Admitting: Family Medicine

## 2020-07-21 NOTE — Progress Notes (Signed)
[place]   [phone] [phone]   Clinic Follow up Note   Patient Care Team: Leeanne Rio, MD as PCP - General (Family Medicine) Mauro Kaufmann, RN as Oncology Nurse Navigator Rockwell Germany, RN as Oncology Nurse Navigator Fanny Skates, MD as Consulting Physician (General Surgery) Truitt Merle, MD as Consulting Physician (Hematology) Kyung Rudd, MD as Consulting Physician (Radiation Oncology) Alla Feeling, NP as Nurse Practitioner (Nurse Practitioner)  Date of Service:  07/23/2020  CHIEF COMPLAINT: f/u of left breast cancer  SUMMARY OF ONCOLOGIC HISTORY: Oncology History Overview Note  Cancer Staging Malignant neoplasm of upper-inner quadrant of left breast in female, estrogen receptor positive ([place]) Staging form: Breast, AJCC 8th Edition - Clinical stage from 12/17/2018: Stage Unknown (cTX, cN0, cM0, G2, ER+, PR+, HER2-) - Signed by Truitt Merle, MD on 12/25/2018 - Pathologic stage from 01/08/2019: Stage IA (pT1c, pN0, cM0, G3, ER+, PR+, HER2-) - Signed by Truitt Merle, MD on 01/22/2019     Malignant neoplasm of upper-inner quadrant of left breast in female, estrogen receptor positive ([place])  11/28/2018 Mammogram   Diagnostic Mammogram 11/28/18 IMPRESSION:  Suspcious mass in upper inner quadrant of the left breast  at posterior depth.    12/17/2018 Initial Biopsy   Diagnosis 12/17/18 Breast, left, needle core biopsy, upper inner quadrant - INVASIVE DUCTAL CARCINOMA, SEE COMMENT. - DUCTAL CARCINOMA IN SITU.   12/17/2018 Cancer Staging   Staging form: Breast, AJCC 8th Edition - Clinical stage from 12/17/2018: Stage Unknown (cTX, cN0, cM0, G2, ER+, PR+, HER2-) - Signed by Truitt Merle, MD on 12/25/2018   12/20/2018 Initial Diagnosis   Malignant neoplasm of upper-inner quadrant of left breast in female, estrogen receptor positive ([place])   12/20/2018 Receptors her2   Results: IMMUNOHISTOCHEMICAL AND MORPHOMETRIC ANALYSIS PERFORMED MANUALLY The  tumor cells are NEGATIVE for Her2 (1+). Estrogen Receptor: 95%, POSITIVE, STRONG STAINING INTENSITY Progesterone Receptor: 30%, POSITIVE, STRONG STAINING INTENSITY Proliferation Marker Ki67: 20%   01/08/2019 Surgery   LEFT BREAST LUMPECTOMY WITH RADIOACTIVE SEED AND LEFT AXILLARY DEEP SENTINEL LYMPH NODE BIOPSY WITH BLUE DYE INJECTION by Dr [name] 01/08/19    01/08/2019 Pathology Results   Diagnosis 01/08/19 1. Breast, lumpectomy, left - INVASIVE DUCTAL CARCINOMA, GRADE III/III, SPANNING 1.8 CM. - DUCTAL CARCINOMA IN SITU, HIGH GRADE. - DUCTAL CARCINOMA IN SITU IS FOCALLY PRESENT AT THE ANTERIOR MARGIN OF SPECIMEN #1. - DUCTAL CARCINOMA IN SITU IS FOCALLY LESS THAN 0.1 CM FROM THE INFERIOR MARGIN OF SPECIMEN # 1. - SEE ONCOLOGY TABLE BELOW. 2. Breast, excision, left medial margin - DUCTAL CARCINOMA IN SITU, FOCALLY PRESENT AT THE MEDIAL MARGIN OF SPECIMEN # 2. 3. Breast, excision, left posterior margin - BENIGN BREAST PARENCHYMA. - THERE IS NO EVIDENCE OF MALIGNANCY. - SEE COMMENT. 4. Lymph node, sentinel, biopsy, left axillary - THERE IS NO EVIDENCE OF CARCINOMA IN 1 OF 1 LYMPH NODE (0/1). 5. Lymph node, sentinel, biopsy, left axillary - THERE IS NO EVIDENCE OF CARCINOMA IN 1 OF 1 LYMPH NODE (0/1). 6. Lymph node, sentinel, biopsy, left axillary - THERE IS NO EVIDENCE OF CARCINOMA IN 1 OF 1 LYMPH NODE (0/1)   01/08/2019 Oncotype testing   recurrence score of 27 with risk of recurrence 16% with Tamoxifen or AI alone. There is a benefit of chemotherapy >15%    01/08/2019 Cancer Staging   Staging form: Breast, AJCC 8th Edition - Pathologic stage from 01/08/2019: Stage IA (pT1c, pN0, cM0, G3, ER+, PR+, HER2-) - Signed by Truitt Merle, MD  on 01/22/2019   01/29/2019 Genetic Testing   Negative genetic testing on the common hereditary cancer panel.  The Common Hereditary Gene Panel offered by [facility] includes sequencing and/or deletion duplication testing of the following 48 genes: APC, ATM, AXIN2,  BARD1, BMPR1A, BRCA1, BRCA2, BRIP1, CDH1, CDK4, CDKN2A (p14ARF), CDKN2A (p16INK4a), CHEK2, CTNNA1, DICER1, EPCAM (Deletion/duplication testing only), GREM1 (promoter region deletion/duplication testing only), KIT, MEN1, MLH1, MSH2, MSH3, MSH6, MUTYH, NBN, NF1, NHTL1, PALB2, PDGFRA, PMS2, POLD1, POLE, PTEN, RAD50, RAD51C, RAD51D, RNF43, SDHB, SDHC, SDHD, SMAD4, SMARCA4. STK11, TP53, TSC1, TSC2, and VHL.  The following genes were evaluated for sequence changes only: SDHA and HOXB13 c.251G>A variant only. The report date is January 29, 2019.   03/01/2019 Surgery   RE-EXCISION OF LEFT BREAST LUMPECTOMY MULITPLE MARGINS by Dr. [name]  Diagnosis 1. Breast, excision, re-excision left anterior margin - BENIGN BREAST PARENCHYMA WITH PREVIOUS PROCEDURE-RELATED CHANGES - NEGATIVE FOR IN SITU OR INVASIVE CARCINOMA 2. 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 SEE NOTE - DCIS IS FOCALLY LESS THAN 1 MM FROM NEW INFERIOR MARGIN - NEGATIVE FOR INVASIVE CARCINOMA - PREVIOUS PROCEDURE-RELATED CHANGES 3. Breast, excision, re-excision left medial margin - FOCAL DUCTAL CARCINOMA IN SITU (DCIS), 0.2 CM. SEE NOTE - DCIS IS 1.5 MM FROM NEW MEDIAL MARGIN - NEGATIVE FOR INVASIVE CARCINOMA - PREVIOUS PROCEDURE-RELATED CHANGES 4. Breast, excision, re-excision left final medial argin - BENIGN BREAST PARENCHYMA WITH MILD FOCAL PREVIOUS PROCEDURE-RELATED CHANGES - NEGATIVE FOR IN SITU OR INVASIVE CARCINOMA    03/22/2019 - 05/24/2019 Chemotherapy   TC q3weeks for 4 cycles starting 03/22/19 and completed on 05/24/19    06/24/2019 - 08/09/2019 Radiation Therapy   Radiation therapy with Dr. Lisbeth Casey 06/24/19-08/09/19   08/2019 -  Anti-estrogen oral therapy   Letrozole 2.68m starting 08/2019   10/23/2019 Survivorship   SCP delivered by LCira Rue NP       CURRENT THERAPY:  Letrozole 2.546mstarting 08/2019  INTERVAL HISTORY:  RiTomia Casey here for a follow up of  left breast cancer. She was last seen by me 6 months ago. She presents to the clinic alone.  She is clinically doing well overall.  She noticed a new small subcutaneous nodule in mid of left palm, asymptomatic.  Her chronic joint pain, especially in the knees, stable.  She is tolerating letrozole well, no worsening joint pain, or significant hot flashes.  She not very physically active at home but functions well at home.  She is trying to quit smoking, still smokes a few cigarettes a day.  All other systems were reviewed with the patient and are negative.  MEDICAL HISTORY:  Past Medical History:  Diagnosis Date  . Anxiety   . Arthritis    right hand  . Back pain   . Breast cancer ([facility]  . Bronchitis   . [place]   left breast ca  . Chronic fatigue syndrome   . Constipation   . Depression   . Diabetes mellitus without complication ([place]  . Family history of breast cancer   . GERD (gastroesophageal reflux disease)   . Headache   . Heart murmur   . Herpes 1&2   . Hyperlipidemia   . Hypertension   . Joint pain   . Leg fracture, left    as a child  . Lymphedema   . Multiple food allergies   . OSA on CPAP    has CPAP but has not used in  over a month  . Prediabetes   . Shortness of breath     SURGICAL HISTORY: Past Surgical History:  Procedure Laterality Date  . ABDOMINAL HYSTERECTOMY    . BREAST LUMPECTOMY WITH RADIOACTIVE SEED AND SENTINEL LYMPH NODE BIOPSY Left 01/08/2019   Procedure: LEFT BREAST LUMPECTOMY WITH RADIOACTIVE SEED AND LEFT AXILLARY DEEP SENTINEL LYMPH NODE BIOPSY WITH BLUE DYE INJECTION;  Surgeon: Fanny Skates, MD;  Location: [facility];  Service: General;  Laterality: Left;  . COLONOSCOPY N/A 09/02/2015   Procedure: COLONOSCOPY;  Surgeon: Danie Binder, MD;  [facility];  Service: Endoscopy;  Laterality: N/A;  11:15 Am  . EYE SURGERY Right    cataract  . HAND SURGERY Right    broken bone  . PORT-A-CATH REMOVAL  Right 08/21/2019   Procedure: REMOVAL PORT-A-CATH;  Surgeon: Fanny Skates, MD;  Location: [facility];  Service: General;  Laterality: Right;  . PORTACATH PLACEMENT N/A 03/01/2019   Procedure: INSERTION PORT-A-CATH;  Surgeon: Fanny Skates, MD;  Location: [facility];  Service: General;  Laterality: N/A;  . RE-EXCISION OF BREAST LUMPECTOMY Left 03/01/2019   Procedure: RE-EXCISION OF LEFT BREAST LUMPECTOMY MULITPLE MARGINS;  Surgeon: Fanny Skates, MD;  Location: [facility];  Service: General;  Laterality: Left;  . TUBAL LIGATION      I have reviewed the social history and family history with the patient and they are unchanged from previous note.  ALLERGIES:  is allergic to asa [aspirin], nsaids, and penicillins.  MEDICATIONS:  Current Outpatient Medications  Medication Sig Dispense Refill  . acyclovir (ZOVIRAX) 400 MG tablet Take 400 mg by mouth 2 (two) times daily.    Marland Kitchen amLODipine (NORVASC) 5 MG tablet Take 5 mg by mouth daily.    . Ascorbic Acid (VITAMIN C) 1000 MG tablet Take 1,000 mg by mouth daily.    Marland Kitchen atorvastatin (LIPITOR) 20 MG tablet Take 20 mg by mouth daily.    . Cholecalciferol (VITAMIN D) 50 MCG (2000 UT) CAPS Take 2,000 Units by mouth daily.    . diphenhydrAMINE (ALLERGY RELIEF) 25 MG tablet Take 25 mg by mouth every 6 (six) hours as needed.    . Dulaglutide (TRULICITY) 5.32 DJ/2.4QA SOPN Inject 0.5 mLs (0.75 mg total) into the skin once a week. (Patient not taking: Reported on 05/13/2020) 2 mL 0  . fluticasone (FLONASE) 50 MCG/ACT nasal spray Place 2 sprays into both nostrils daily.     . folic acid (FOLVITE) 834 MCG tablet Take 400 mcg by mouth daily.     Marland Kitchen gabapentin (NEURONTIN) 300 MG capsule TAKE 1 CAPSULE BY MOUTH  TWICE DAILY 180 capsule 3  . lactulose (CHRONULAC) 10 GM/15ML solution Take 15 mLs (10 g total) by mouth 2 (two) times daily. 236 mL 0  . letrozole (FEMARA) 2.5 MG tablet Take 1 tablet (2.5 mg total) by mouth daily. 90 tablet 3  . lisinopril  (ZESTRIL) 2.5 MG tablet Take 2.5 mg by mouth at bedtime.    Marland Kitchen loratadine (CLARITIN) 10 MG tablet Take 10 mg by mouth daily.    . metFORMIN (GLUCOPHAGE-XR) 500 MG 24 hr tablet     . montelukast (SINGULAIR) 10 MG tablet Take 10 mg by mouth at bedtime.    . Multiple Vitamin (MULTIVITAMIN) capsule Take 1 capsule by mouth daily.    Marland Kitchen omeprazole (PRILOSEC) 20 MG capsule Take 20 mg by mouth daily.    . ondansetron (ZOFRAN) 8 MG tablet Take 1 tablet (8 mg total) by mouth 2 (  two) times daily as needed for refractory nausea / vomiting. Start on day 3 after chemo. 30 tablet 1  . PARoxetine (PAXIL) 10 MG tablet Take 10 mg by mouth at bedtime.    . potassium chloride (MICRO-K) 10 MEQ CR capsule Take 10 mEq by mouth 2 (two) times daily.    . prochlorperazine (COMPAZINE) 10 MG tablet Take 1 tablet (10 mg total) by mouth every 6 (six) hours as needed (Nausea or vomiting). 30 tablet 1  . tiZANidine (ZANAFLEX) 4 MG tablet Take 4 mg by mouth 3 (three) times daily.    Marland Kitchen topiramate (TOPAMAX) 100 MG tablet Take 100 mg by mouth daily.    . vitamin B-12 (CYANOCOBALAMIN) 500 MCG tablet Take 500 mcg by mouth daily.     . Vitamin D, Ergocalciferol, (DRISDOL) 1.25 MG (50000 UNIT) CAPS capsule Take 1 capsule (50,000 Units total) by mouth every 7 (seven) days. (Patient not taking: Reported on 05/13/2020) 4 capsule 0   No current facility-administered medications for this visit.    PHYSICAL EXAMINATION: ECOG PERFORMANCE STATUS: 1 - Symptomatic but completely ambulatory  Vitals:   07/23/20 1318  BP: 131/77  Pulse: (!) 110  Resp: 18  Temp: 97.6 F (36.4 C)  SpO2: 97%   Filed Weights   07/23/20 1318  Weight: 262 lb 8 oz (119.1 kg)    GENERAL:alert, no distress and comfortable SKIN: skin color, texture, turgor are normal, no rashes or significant lesions EYES: normal, Conjunctiva are pink and non-injected, sclera clear NECK: supple, thyroid normal size, non-tender, without nodularity LYMPH:  no palpable  lymphadenopathy in the cervical, axillary  LUNGS: clear to auscultation and percussion with normal breathing effort HEART: regular rate & rhythm and no murmurs and no lower extremity edema ABDOMEN:abdomen soft, non-tender and normal bowel sounds Musculoskeletal:no cyanosis of digits and no clubbing  NEURO: alert & oriented x 3 with fluent speech, no focal motor/sensory deficits Breasts: Breast inspection showed them to be symmetrical with no nipple discharge.  Incision in the left breast has healed well, palpation of the breasts and axilla revealed no obvious mass that I could appreciate.   LABORATORY DATA:  I have reviewed the data as listed CBC Latest Ref Rng & Units 07/23/2020 04/15/2020 01/22/2020  WBC 4.0 - 10.5 K/uL 7.8 7.7 6.4  Hemoglobin 12.0 - 15.0 g/dL 12.9 14.0 12.7  Hematocrit 36 - 46 % 42.0 44.4 41.8  Platelets 150 - 400 K/uL 284 276 255     CMP Latest Ref Rng & Units 04/15/2020 01/22/2020 07/24/2019  Glucose 65 - 99 mg/dL 150(H) 199(H) 208(H)  BUN 6 - 24 mg/dL 7 9 9   Creatinine 0.57 - 1.00 mg/dL 1.24(H) 1.35(H) 1.08(H)  Sodium 134 - 144 mmol/L 142 141 142  Potassium 3.5 - 5.2 mmol/L 3.9 3.7 3.3(L)  Chloride 96 - 106 mmol/L 100 106 105  CO2 20 - 29 mmol/L 25 26 28   Calcium 8.7 - 10.2 mg/dL 10.0 9.2 9.0  Total Protein 6.0 - 8.5 g/dL 7.4 6.7 6.1(L)  Total Bilirubin 0.0 - 1.2 mg/dL 0.8 0.5 0.7  Alkaline Phos 48 - 121 IU/L 113 89 59  AST 0 - 40 IU/L 13 10(L) 9(L)  ALT 0 - 32 IU/L 11 10 8       RADIOGRAPHIC STUDIES: I have personally reviewed the radiological images as listed and agreed with the findings in the report. No results found.   ASSESSMENT & PLAN:  Sadey Yandell is a 59 y.o. female with    1.Malignant neoplasm of  upper-inner quadrant of left breast,pT1cN0,stage I,ER/PR+,HER2-, GradeIII, Oncotype RS 27 -She was diagnosed in 11/2018. She underwentleftbreastlumpectomyand SLN biopsyon 01/08/19. Given positive margins she underwent left re-excision on  03/01/19.Oncotype showed RS 27, high risk.  -To reduce her risk of recurrence she completed 4cyclesof TC in 05/2019 and adjuvant Radiation.  -To reduce her risk of distant cancer, I started her on antiestrogen therapy with letrozole in 08/2019. she is tolerating well  -She is clinically doing very well, exam was unremarkable, lab reviewed CBC and CMP are within normal limits except creatinine 1.24 -Continue breast cancer surveillance, -Follow-up in 6 months.    2. HTN, DMwith hyperglycemia, Obesity -On Amlodipine, lisinopril, metformin 3 tabs once daily.   -HTN controlled and poorly controlled hyperglycemia.  -I previously recommended low carb diet with more protein and vegetables, work on weight loss, check her BG before breakfast and 1-2 hours after lunch or dinner. I also recommend she f/u with her PCP for better DM management.  -I also [facility] to [facility].  -I again encouraged her to watch diet and increase activity level, to lose weight  3.CKD stage III -secondary to DM and HTN. Will monitor  -Overall stable  4. Smoking Cessation  -She smoked for 10 years before she quit. She restarted after recent cancer diagnosis  -I strongly encouraged her to quit smoking completely and continue cessation. She understands and agrees. -She has been trying to quit smoking, still smokes a few cigarettes per day  5. Genetic Testingwasnegativefor pathogenetic mutations  6. B/l leg pain, neuropathy of feet, G1 -Likely secondary to chemo  -Overall improved some, on gabapentin  7.  Subcutaneous nodule in left palm, likely cyst -asymptomatic, monitoring   PLAN: -she is clinically doing well  -Continue Letrozole  -lab and f/u in 6 months  -next mamm[facility] at [facility] in April 2022 -We will obtain her last bone density scan from [facility]   No problem-specific Assessment & Plan notes found for this encounter.   No orders  of the defined types were placed in this encounter.  All questions were answered. The patient knows to call the clinic with any problems, questions or concerns. No barriers to learning was detected.      Truitt Merle, MD 07/23/2020   I, Joslyn Devon, am acting as scribe for Truitt Merle, MD.   I have reviewed the above documentation for accuracy and completeness, and I agree with the above.

## 2020-07-22 ENCOUNTER — Other Ambulatory Visit: Payer: Self-pay

## 2020-07-22 DIAGNOSIS — C50212 Malignant neoplasm of upper-inner quadrant of left female breast: Secondary | ICD-10-CM

## 2020-07-23 ENCOUNTER — Inpatient Hospital Stay: Payer: Medicare Other | Attending: Hematology | Admitting: Hematology

## 2020-07-23 ENCOUNTER — Other Ambulatory Visit: Payer: Self-pay

## 2020-07-23 ENCOUNTER — Encounter: Payer: Self-pay | Admitting: Hematology

## 2020-07-23 ENCOUNTER — Inpatient Hospital Stay: Payer: Medicare Other

## 2020-07-23 VITALS — BP 131/77 | HR 110 | Temp 97.6°F | Resp 18 | Ht 65.0 in | Wt 262.5 lb

## 2020-07-23 DIAGNOSIS — Z8249 Family history of ischemic heart disease and other diseases of the circulatory system: Secondary | ICD-10-CM | POA: Diagnosis not present

## 2020-07-23 DIAGNOSIS — Z923 Personal history of irradiation: Secondary | ICD-10-CM | POA: Diagnosis not present

## 2020-07-23 DIAGNOSIS — Z79899 Other long term (current) drug therapy: Secondary | ICD-10-CM | POA: Insufficient documentation

## 2020-07-23 DIAGNOSIS — I129 Hypertensive chronic kidney disease with stage 1 through stage 4 chronic kidney disease, or unspecified chronic kidney disease: Secondary | ICD-10-CM | POA: Diagnosis not present

## 2020-07-23 DIAGNOSIS — E669 Obesity, unspecified: Secondary | ICD-10-CM | POA: Insufficient documentation

## 2020-07-23 DIAGNOSIS — F419 Anxiety disorder, unspecified: Secondary | ICD-10-CM | POA: Diagnosis not present

## 2020-07-23 DIAGNOSIS — Z90710 Acquired absence of both cervix and uterus: Secondary | ICD-10-CM | POA: Diagnosis not present

## 2020-07-23 DIAGNOSIS — E1122 Type 2 diabetes mellitus with diabetic chronic kidney disease: Secondary | ICD-10-CM | POA: Diagnosis not present

## 2020-07-23 DIAGNOSIS — Z170 Estrogen receptor positive status [ER+]: Secondary | ICD-10-CM

## 2020-07-23 DIAGNOSIS — Z7984 Long term (current) use of oral hypoglycemic drugs: Secondary | ICD-10-CM | POA: Diagnosis not present

## 2020-07-23 DIAGNOSIS — F17210 Nicotine dependence, cigarettes, uncomplicated: Secondary | ICD-10-CM | POA: Diagnosis not present

## 2020-07-23 DIAGNOSIS — N183 Chronic kidney disease, stage 3 unspecified: Secondary | ICD-10-CM | POA: Insufficient documentation

## 2020-07-23 DIAGNOSIS — Z79811 Long term (current) use of aromatase inhibitors: Secondary | ICD-10-CM | POA: Diagnosis not present

## 2020-07-23 DIAGNOSIS — F32A Depression, unspecified: Secondary | ICD-10-CM | POA: Diagnosis not present

## 2020-07-23 DIAGNOSIS — Z9221 Personal history of antineoplastic chemotherapy: Secondary | ICD-10-CM | POA: Diagnosis not present

## 2020-07-23 DIAGNOSIS — C50212 Malignant neoplasm of upper-inner quadrant of left female breast: Secondary | ICD-10-CM

## 2020-07-23 DIAGNOSIS — Z8349 Family history of other endocrine, nutritional and metabolic diseases: Secondary | ICD-10-CM | POA: Insufficient documentation

## 2020-07-23 DIAGNOSIS — Z8379 Family history of other diseases of the digestive system: Secondary | ICD-10-CM | POA: Diagnosis not present

## 2020-07-23 LAB — CBC WITH DIFFERENTIAL (CANCER CENTER ONLY)
Abs Immature Granulocytes: 0.02 10*3/uL (ref 0.00–0.07)
Basophils Absolute: 0 10*3/uL (ref 0.0–0.1)
Basophils Relative: 0 %
Eosinophils Absolute: 0.3 10*3/uL (ref 0.0–0.5)
Eosinophils Relative: 3 %
HCT: 42 % (ref 36.0–46.0)
Hemoglobin: 12.9 g/dL (ref 12.0–15.0)
Immature Granulocytes: 0 %
Lymphocytes Relative: 20 %
Lymphs Abs: 1.5 10*3/uL (ref 0.7–4.0)
MCH: 29 pg (ref 26.0–34.0)
MCHC: 30.7 g/dL (ref 30.0–36.0)
MCV: 94.4 fL (ref 80.0–100.0)
Monocytes Absolute: 0.4 10*3/uL (ref 0.1–1.0)
Monocytes Relative: 5 %
Neutro Abs: 5.6 10*3/uL (ref 1.7–7.7)
Neutrophils Relative %: 72 %
Platelet Count: 284 10*3/uL (ref 150–400)
RBC: 4.45 MIL/uL (ref 3.87–5.11)
RDW: 14 % (ref 11.5–15.5)
WBC Count: 7.8 10*3/uL (ref 4.0–10.5)
nRBC: 0 % (ref 0.0–0.2)

## 2020-07-23 LAB — CMP (CANCER CENTER ONLY)
ALT: 11 U/L (ref 0–44)
AST: 10 U/L — ABNORMAL LOW (ref 15–41)
Albumin: 3.8 g/dL (ref 3.5–5.0)
Alkaline Phosphatase: 84 U/L (ref 38–126)
Anion gap: 9 (ref 5–15)
BUN: 11 mg/dL (ref 6–20)
CO2: 27 mmol/L (ref 22–32)
Calcium: 9.7 mg/dL (ref 8.9–10.3)
Chloride: 105 mmol/L (ref 98–111)
Creatinine: 1.41 mg/dL — ABNORMAL HIGH (ref 0.44–1.00)
GFR, Estimated: 43 mL/min — ABNORMAL LOW (ref >60)
Glucose, Bld: 222 mg/dL — ABNORMAL HIGH (ref 70–99)
Potassium: 3.5 mmol/L (ref 3.5–5.1)
Sodium: 141 mmol/L (ref 135–145)
Total Bilirubin: 0.5 mg/dL (ref 0.3–1.2)
Total Protein: 7.2 g/dL (ref 6.5–8.1)

## 2020-07-24 ENCOUNTER — Telehealth: Payer: Self-pay | Admitting: Hematology

## 2020-07-24 NOTE — Telephone Encounter (Signed)
Scheduled appointments per 12/2 los. Mailed updated calendar to patient

## 2020-08-18 ENCOUNTER — Telehealth: Payer: Self-pay

## 2020-08-18 ENCOUNTER — Other Ambulatory Visit: Payer: Self-pay | Admitting: Nurse Practitioner

## 2020-08-18 NOTE — Telephone Encounter (Signed)
Faxed request for most recent Dexa report to [facility], [phone]

## 2021-01-21 ENCOUNTER — Other Ambulatory Visit: Payer: Self-pay

## 2021-01-21 ENCOUNTER — Inpatient Hospital Stay: Payer: Medicare Other | Attending: Hematology

## 2021-01-21 ENCOUNTER — Encounter: Payer: Self-pay | Admitting: Hematology

## 2021-01-21 ENCOUNTER — Inpatient Hospital Stay (HOSPITAL_BASED_OUTPATIENT_CLINIC_OR_DEPARTMENT_OTHER): Payer: Medicare Other | Admitting: Hematology

## 2021-01-21 VITALS — BP 139/85 | HR 73 | Temp 98.3°F | Resp 18 | Ht 65.0 in | Wt 264.5 lb

## 2021-01-21 DIAGNOSIS — Z9221 Personal history of antineoplastic chemotherapy: Secondary | ICD-10-CM | POA: Diagnosis not present

## 2021-01-21 DIAGNOSIS — I10 Essential (primary) hypertension: Secondary | ICD-10-CM | POA: Insufficient documentation

## 2021-01-21 DIAGNOSIS — N183 Chronic kidney disease, stage 3 unspecified: Secondary | ICD-10-CM | POA: Insufficient documentation

## 2021-01-21 DIAGNOSIS — F17210 Nicotine dependence, cigarettes, uncomplicated: Secondary | ICD-10-CM | POA: Diagnosis not present

## 2021-01-21 DIAGNOSIS — E2839 Other primary ovarian failure: Secondary | ICD-10-CM

## 2021-01-21 DIAGNOSIS — Z923 Personal history of irradiation: Secondary | ICD-10-CM | POA: Insufficient documentation

## 2021-01-21 DIAGNOSIS — Z79811 Long term (current) use of aromatase inhibitors: Secondary | ICD-10-CM | POA: Diagnosis not present

## 2021-01-21 DIAGNOSIS — C50212 Malignant neoplasm of upper-inner quadrant of left female breast: Secondary | ICD-10-CM

## 2021-01-21 DIAGNOSIS — Z170 Estrogen receptor positive status [ER+]: Secondary | ICD-10-CM | POA: Diagnosis not present

## 2021-01-21 DIAGNOSIS — Z79899 Other long term (current) drug therapy: Secondary | ICD-10-CM | POA: Diagnosis not present

## 2021-01-21 DIAGNOSIS — E119 Type 2 diabetes mellitus without complications: Secondary | ICD-10-CM | POA: Insufficient documentation

## 2021-01-21 DIAGNOSIS — Z7984 Long term (current) use of oral hypoglycemic drugs: Secondary | ICD-10-CM | POA: Diagnosis not present

## 2021-01-21 LAB — CMP (CANCER CENTER ONLY)
ALT: 11 U/L (ref 0–44)
AST: 13 U/L — ABNORMAL LOW (ref 15–41)
Albumin: 3.9 g/dL (ref 3.5–5.0)
Alkaline Phosphatase: 96 U/L (ref 38–126)
Anion gap: 9 (ref 5–15)
BUN: 10 mg/dL (ref 6–20)
CO2: 29 mmol/L (ref 22–32)
Calcium: 9.7 mg/dL (ref 8.9–10.3)
Chloride: 102 mmol/L (ref 98–111)
Creatinine: 1.34 mg/dL — ABNORMAL HIGH (ref 0.44–1.00)
GFR, Estimated: 45 mL/min — ABNORMAL LOW (ref >60)
Glucose, Bld: 214 mg/dL — ABNORMAL HIGH (ref 70–99)
Potassium: 3.8 mmol/L (ref 3.5–5.1)
Sodium: 140 mmol/L (ref 135–145)
Total Bilirubin: 0.8 mg/dL (ref 0.3–1.2)
Total Protein: 7.1 g/dL (ref 6.5–8.1)

## 2021-01-21 LAB — CBC WITH DIFFERENTIAL (CANCER CENTER ONLY)
Abs Immature Granulocytes: 0.02 10*3/uL (ref 0.00–0.07)
Basophils Absolute: 0 10*3/uL (ref 0.0–0.1)
Basophils Relative: 0 %
Eosinophils Absolute: 0.1 10*3/uL (ref 0.0–0.5)
Eosinophils Relative: 2 %
HCT: 40.7 % (ref 36.0–46.0)
Hemoglobin: 12.5 g/dL (ref 12.0–15.0)
Immature Granulocytes: 0 %
Lymphocytes Relative: 27 %
Lymphs Abs: 1.9 10*3/uL (ref 0.7–4.0)
MCH: 29 pg (ref 26.0–34.0)
MCHC: 30.7 g/dL (ref 30.0–36.0)
MCV: 94.4 fL (ref 80.0–100.0)
Monocytes Absolute: 0.4 10*3/uL (ref 0.1–1.0)
Monocytes Relative: 5 %
Neutro Abs: 4.6 10*3/uL (ref 1.7–7.7)
Neutrophils Relative %: 66 %
Platelet Count: 289 10*3/uL (ref 150–400)
RBC: 4.31 MIL/uL (ref 3.87–5.11)
RDW: 13.7 % (ref 11.5–15.5)
WBC Count: 7.1 10*3/uL (ref 4.0–10.5)
nRBC: 0 % (ref 0.0–0.2)

## 2021-01-21 MED ORDER — LETROZOLE 2.5 MG PO TABS: 2.5000 mg | ORAL_TABLET | Freq: Every day | ORAL | 1 refills | Status: DC

## 2021-01-21 NOTE — Progress Notes (Signed)
[place]   [phone] [phone]   Clinic Follow up Note   Patient Care Team: Leeanne Rio, MD as PCP - General (Family Medicine) Mauro Kaufmann, RN as Oncology Nurse Navigator Rockwell Germany, RN as Oncology Nurse Navigator Fanny Skates, MD as Consulting Physician (General Surgery) Truitt Merle, MD as Consulting Physician (Hematology) Kyung Rudd, MD as Consulting Physician (Radiation Oncology) Alla Feeling, NP as Nurse Practitioner (Nurse Practitioner)  Date of Service:  01/21/2021  CHIEF COMPLAINT: f/u of left breast cancer  SUMMARY OF ONCOLOGIC HISTORY: Oncology History Overview Note  Cancer Staging Malignant neoplasm of upper-inner quadrant of left breast in female, estrogen receptor positive ([place]) Staging form: Breast, AJCC 8th Edition - Clinical stage from 12/17/2018: Stage Unknown (cTX, cN0, cM0, G2, ER+, PR+, HER2-) - Signed by Truitt Merle, MD on 12/25/2018 - Pathologic stage from 01/08/2019: Stage IA (pT1c, pN0, cM0, G3, ER+, PR+, HER2-) - Signed by Truitt Merle, MD on 01/22/2019     Malignant neoplasm of upper-inner quadrant of left breast in female, estrogen receptor positive ([place])  11/28/2018 Mammogram   Diagnostic Mammogram 11/28/18 IMPRESSION:  Suspcious mass in upper inner quadrant of the left breast  at posterior depth.    12/17/2018 Initial Biopsy   Diagnosis 12/17/18 Breast, left, needle core biopsy, upper inner quadrant - INVASIVE DUCTAL CARCINOMA, SEE COMMENT. - DUCTAL CARCINOMA IN SITU.   12/17/2018 Cancer Staging   Staging form: Breast, AJCC 8th Edition - Clinical stage from 12/17/2018: Stage Unknown (cTX, cN0, cM0, G2, ER+, PR+, HER2-) - Signed by Truitt Merle, MD on 12/25/2018   12/20/2018 Initial Diagnosis   Malignant neoplasm of upper-inner quadrant of left breast in female, estrogen receptor positive ([place])   12/20/2018 Receptors her2   Results: IMMUNOHISTOCHEMICAL AND MORPHOMETRIC ANALYSIS PERFORMED MANUALLY The tumor  cells are NEGATIVE for Her2 (1+). Estrogen Receptor: 95%, POSITIVE, STRONG STAINING INTENSITY Progesterone Receptor: 30%, POSITIVE, STRONG STAINING INTENSITY Proliferation Marker Ki67: 20%   01/08/2019 Surgery   LEFT BREAST LUMPECTOMY WITH RADIOACTIVE SEED AND LEFT AXILLARY DEEP SENTINEL LYMPH NODE BIOPSY WITH BLUE DYE INJECTION by Dr [name] 01/08/19    01/08/2019 Pathology Results   Diagnosis 01/08/19 1. Breast, lumpectomy, left - INVASIVE DUCTAL CARCINOMA, GRADE III/III, SPANNING 1.8 CM. - DUCTAL CARCINOMA IN SITU, HIGH GRADE. - DUCTAL CARCINOMA IN SITU IS FOCALLY PRESENT AT THE ANTERIOR MARGIN OF SPECIMEN #1. - DUCTAL CARCINOMA IN SITU IS FOCALLY LESS THAN 0.1 CM FROM THE INFERIOR MARGIN OF SPECIMEN # 1. - SEE ONCOLOGY TABLE BELOW. 2. Breast, excision, left medial margin - DUCTAL CARCINOMA IN SITU, FOCALLY PRESENT AT THE MEDIAL MARGIN OF SPECIMEN # 2. 3. Breast, excision, left posterior margin - BENIGN BREAST PARENCHYMA. - THERE IS NO EVIDENCE OF MALIGNANCY. - SEE COMMENT. 4. Lymph node, sentinel, biopsy, left axillary - THERE IS NO EVIDENCE OF CARCINOMA IN 1 OF 1 LYMPH NODE (0/1). 5. Lymph node, sentinel, biopsy, left axillary - THERE IS NO EVIDENCE OF CARCINOMA IN 1 OF 1 LYMPH NODE (0/1). 6. Lymph node, sentinel, biopsy, left axillary - THERE IS NO EVIDENCE OF CARCINOMA IN 1 OF 1 LYMPH NODE (0/1)   01/08/2019 Oncotype testing   recurrence score of 27 with risk of recurrence 16% with Tamoxifen or AI alone. There is a benefit of chemotherapy >15%    01/08/2019 Cancer Staging   Staging form: Breast, AJCC 8th Edition - Pathologic stage from 01/08/2019: Stage IA (pT1c, pN0, cM0, G3, ER+, PR+, HER2-) - Signed by Truitt Merle, MD  on 01/22/2019   01/29/2019 Genetic Testing   Negative genetic testing on the common hereditary cancer panel.  The Common Hereditary Gene Panel offered by [facility] includes sequencing and/or deletion duplication testing of the following 48 genes: APC, ATM, AXIN2, BARD1,  BMPR1A, BRCA1, BRCA2, BRIP1, CDH1, CDK4, CDKN2A (p14ARF), CDKN2A (p16INK4a), CHEK2, CTNNA1, DICER1, EPCAM (Deletion/duplication testing only), GREM1 (promoter region deletion/duplication testing only), KIT, MEN1, MLH1, MSH2, MSH3, MSH6, MUTYH, NBN, NF1, NHTL1, PALB2, PDGFRA, PMS2, POLD1, POLE, PTEN, RAD50, RAD51C, RAD51D, RNF43, SDHB, SDHC, SDHD, SMAD4, SMARCA4. STK11, TP53, TSC1, TSC2, and VHL.  The following genes were evaluated for sequence changes only: SDHA and HOXB13 c.251G>A variant only. The report date is January 29, 2019.   03/01/2019 Surgery   RE-EXCISION OF LEFT BREAST LUMPECTOMY MULITPLE MARGINS by Dr. [name]  Diagnosis 1. Breast, excision, re-excision left anterior margin - BENIGN BREAST PARENCHYMA WITH PREVIOUS PROCEDURE-RELATED CHANGES - NEGATIVE FOR IN SITU OR INVASIVE CARCINOMA 2. 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 SEE NOTE - DCIS IS FOCALLY LESS THAN 1 MM FROM NEW INFERIOR MARGIN - NEGATIVE FOR INVASIVE CARCINOMA - PREVIOUS PROCEDURE-RELATED CHANGES 3. Breast, excision, re-excision left medial margin - FOCAL DUCTAL CARCINOMA IN SITU (DCIS), 0.2 CM. SEE NOTE - DCIS IS 1.5 MM FROM NEW MEDIAL MARGIN - NEGATIVE FOR INVASIVE CARCINOMA - PREVIOUS PROCEDURE-RELATED CHANGES 4. Breast, excision, re-excision left final medial argin - BENIGN BREAST PARENCHYMA WITH MILD FOCAL PREVIOUS PROCEDURE-RELATED CHANGES - NEGATIVE FOR IN SITU OR INVASIVE CARCINOMA    03/22/2019 - 05/24/2019 Chemotherapy   TC q3weeks for 4 cycles starting 03/22/19 and completed on 05/24/19    06/24/2019 - 08/09/2019 Radiation Therapy   Radiation therapy with Dr. Lisbeth Renshaw 06/24/19-08/09/19   08/2019 -  Anti-estrogen oral therapy   Letrozole 2.68m starting 08/2019   10/23/2019 Survivorship   SCP delivered by LCira Rue NP       CURRENT THERAPY:  Letrozole 2.532mstarting 08/2019  INTERVAL HISTORY:  Emma Casey here for a follow up of breast  cancer. She was last seen by me on 07/23/20. She presents to the clinic alone. She notes some residual shooting pain in her breast, maybe twice in the last month. She denies any bone pain. She notes some joint pain in her wrist, which she had previously broken.  REVIEW OF SYSTEMS:   Constitutional: Denies fevers, chills or abnormal weight loss Eyes: Denies blurriness of vision Ears, nose, mouth, throat, and face: Denies mucositis or sore throat Respiratory: Denies cough, dyspnea or wheezes Cardiovascular: Denies palpitation, chest discomfort or lower extremity swelling Gastrointestinal:  Denies nausea, heartburn or change in bowel habits Skin: Denies abnormal skin rashes Lymphatics: Denies new lymphadenopathy or easy bruising Neurological:Denies numbness, tingling or new weaknesses Behavioral/Psych: Mood is stable, no new changes  All other systems were reviewed with the patient and are negative.  MEDICAL HISTORY:  Past Medical History:  Diagnosis Date  . Anxiety   . Arthritis    right hand  . Back pain   . Breast cancer ([facility]  . Bronchitis   . [facility] ([facility]   left breast ca  . Chronic fatigue syndrome   . Constipation   . Depression   . Diabetes mellitus without complication ([facility]  . Family history of breast cancer   . GERD (gastroesophageal reflux disease)   . Headache   . Heart murmur   . Herpes 1&2   . Hyperlipidemia   . Hypertension   .  Joint pain   . Leg fracture, left    as a child  . Lymphedema   . Multiple food allergies   . OSA on CPAP    has CPAP but has not used in over a month  . Prediabetes   . Shortness of breath     SURGICAL HISTORY: Past Surgical History:  Procedure Laterality Date  . ABDOMINAL HYSTERECTOMY    . BREAST LUMPECTOMY WITH RADIOACTIVE SEED AND SENTINEL LYMPH NODE BIOPSY Left 01/08/2019   Procedure: LEFT BREAST LUMPECTOMY WITH RADIOACTIVE SEED AND LEFT AXILLARY DEEP SENTINEL LYMPH NODE BIOPSY WITH BLUE DYE INJECTION;  Surgeon:  Fanny Skates, MD;  Location: [place];  Service: General;  Laterality: Left;  . COLONOSCOPY N/A 09/02/2015   Procedure: COLONOSCOPY;  Surgeon: Danie Binder, MD;  [facility];  Service: Endoscopy;  Laterality: N/A;  11:15 Am  . EYE SURGERY Right    cataract  . HAND SURGERY Right    broken bone  . PORT-A-CATH REMOVAL Right 08/21/2019   Procedure: REMOVAL PORT-A-CATH;  Surgeon: Fanny Skates, MD;  Location: [place];  Service: General;  Laterality: Right;  . PORTACATH PLACEMENT N/A 03/01/2019   Procedure: INSERTION PORT-A-CATH;  Surgeon: Fanny Skates, MD;  Location: [place];  Service: General;  Laterality: N/A;  . RE-EXCISION OF BREAST LUMPECTOMY Left 03/01/2019   Procedure: RE-EXCISION OF LEFT BREAST LUMPECTOMY MULITPLE MARGINS;  Surgeon: Fanny Skates, MD;  Location: [place];  Service: General;  Laterality: Left;  . TUBAL LIGATION      I have reviewed the social history and family history with the patient and they are unchanged from previous note.  ALLERGIES:  is allergic to asa [aspirin], nsaids, and penicillins.  MEDICATIONS:  Current Outpatient Medications  Medication Sig Dispense Refill  . acyclovir (ZOVIRAX) 400 MG tablet Take 400 mg by mouth 2 (two) times daily.    Marland Kitchen amLODipine (NORVASC) 5 MG tablet Take 5 mg by mouth daily.    . Ascorbic Acid (VITAMIN C) 1000 MG tablet Take 1,000 mg by mouth daily.    Marland Kitchen atorvastatin (LIPITOR) 20 MG tablet Take 20 mg by mouth daily.    . Cholecalciferol (VITAMIN D) 50 MCG (2000 UT) CAPS Take 2,000 Units by mouth daily.    . diphenhydrAMINE (ALLERGY RELIEF) 25 MG tablet Take 25 mg by mouth every 6 (six) hours as needed.    . Dulaglutide (TRULICITY) 2.99 ME/2.6ST SOPN Inject 0.5 mLs (0.75 mg total) into the skin once a week. (Patient not taking: Reported on 05/13/2020) 2 mL 0  . fluticasone (FLONASE) 50 MCG/ACT nasal spray Place 2 sprays into both nostrils daily.     . folic acid (FOLVITE) 419 MCG  tablet Take 400 mcg by mouth daily.     Marland Kitchen gabapentin (NEURONTIN) 300 MG capsule TAKE 1 CAPSULE BY MOUTH  TWICE DAILY 180 capsule 3  . lactulose (CHRONULAC) 10 GM/15ML solution Take 15 mLs (10 g total) by mouth 2 (two) times daily. 236 mL 0  . letrozole (FEMARA) 2.5 MG tablet Take 1 tablet (2.5 mg total) by mouth daily. 90 tablet 1  . lisinopril (ZESTRIL) 2.5 MG tablet Take 2.5 mg by mouth at bedtime.    Marland Kitchen loratadine (CLARITIN) 10 MG tablet Take 10 mg by mouth daily.    . metFORMIN (GLUCOPHAGE-XR) 500 MG 24 hr tablet     . montelukast (SINGULAIR) 10 MG tablet Take 10 mg by mouth at bedtime.    . Multiple Vitamin (MULTIVITAMIN) capsule  Take 1 capsule by mouth daily.    Marland Kitchen omeprazole (PRILOSEC) 20 MG capsule Take 20 mg by mouth daily.    . ondansetron (ZOFRAN) 8 MG tablet Take 1 tablet (8 mg total) by mouth 2 (two) times daily as needed for refractory nausea / vomiting. Start on day 3 after chemo. 30 tablet 1  . PARoxetine (PAXIL) 10 MG tablet Take 10 mg by mouth at bedtime.    . potassium chloride (MICRO-K) 10 MEQ CR capsule Take 10 mEq by mouth 2 (two) times daily.    . prochlorperazine (COMPAZINE) 10 MG tablet Take 1 tablet (10 mg total) by mouth every 6 (six) hours as needed (Nausea or vomiting). 30 tablet 1  . tiZANidine (ZANAFLEX) 4 MG tablet Take 4 mg by mouth 3 (three) times daily.    Marland Kitchen topiramate (TOPAMAX) 100 MG tablet Take 100 mg by mouth daily.    . vitamin B-12 (CYANOCOBALAMIN) 500 MCG tablet Take 500 mcg by mouth daily.     . Vitamin D, Ergocalciferol, (DRISDOL) 1.25 MG (50000 UNIT) CAPS capsule Take 1 capsule (50,000 Units total) by mouth every 7 (seven) days. (Patient not taking: Reported on 05/13/2020) 4 capsule 0   No current facility-administered medications for this visit.    PHYSICAL EXAMINATION: ECOG PERFORMANCE STATUS: 0 - Asymptomatic  Vitals:   01/21/21 1247  BP: 139/85  Pulse: 73  Resp: 18  Temp: 98.3 F (36.8 C)  SpO2: 99%   Filed Weights   01/21/21 1247   Weight: 264 lb 8 oz (120 kg)    GENERAL:alert, no distress and comfortable SKIN: skin color, texture, turgor are normal, no rashes or significant lesions EYES: normal, Conjunctiva are pink and non-injected, sclera clear  NECK: supple, thyroid normal size, non-tender, without nodularity LYMPH:  no palpable lymphadenopathy in the cervical, axillary  LUNGS: clear to auscultation and percussion with normal breathing effort HEART: regular rate & rhythm and no murmurs and no lower extremity edema ABDOMEN:abdomen soft, non-tender and normal bowel sounds Musculoskeletal:no cyanosis of digits and no clubbing  NEURO: alert & oriented x 3 with fluent speech, no focal motor/sensory deficits Breasts: Breast inspection showed them to be symmetrical with no nipple discharge. Palpation of the breasts and axilla revealed no obvious mass that I could appreciate.   LABORATORY DATA:  I have reviewed the data as listed CBC Latest Ref Rng & Units 01/21/2021 07/23/2020 04/15/2020  WBC 4.0 - 10.5 K/uL 7.1 7.8 7.7  Hemoglobin 12.0 - 15.0 g/dL 12.5 12.9 14.0  Hematocrit 36.0 - 46.0 % 40.7 42.0 44.4  Platelets 150 - 400 K/uL 289 284 276     CMP Latest Ref Rng & Units 01/21/2021 07/23/2020 04/15/2020  Glucose 70 - 99 mg/dL 214(H) 222(H) 150(H)  BUN 6 - 20 mg/dL 10 11 7   Creatinine 0.44 - 1.00 mg/dL 1.34(H) 1.41(H) 1.24(H)  Sodium 135 - 145 mmol/L 140 141 142  Potassium 3.5 - 5.1 mmol/L 3.8 3.5 3.9  Chloride 98 - 111 mmol/L 102 105 100  CO2 22 - 32 mmol/L 29 27 25   Calcium 8.9 - 10.3 mg/dL 9.7 9.7 10.0  Total Protein 6.5 - 8.1 g/dL 7.1 7.2 7.4  Total Bilirubin 0.3 - 1.2 mg/dL 0.8 0.5 0.8  Alkaline Phos 38 - 126 U/L 96 84 113  AST 15 - 41 U/L 13(L) 10(L) 13  ALT 0 - 44 U/L 11 11 11       RADIOGRAPHIC STUDIES: I have personally reviewed the radiological images as listed and agreed with the  findings in the report. No results found.   ASSESSMENT & PLAN:  Jerald Villalona is a 60 y.o. female with    1.Malignant neoplasm of upper-inner quadrant of left breast,pT1cN0,stage I,ER/PR+,HER2-, GradeIII, Oncotype RS 27 -She was diagnosed in 11/2018. She underwentleftbreastlumpectomyand SLN biopsyon 01/08/19. Given positive margins she underwent left re-excision on 03/01/19.Oncotype showed RS 27, high risk.  -To reduce her risk of recurrence she completed 4cyclesof TC in 10/2020and adjuvant Radiation.  -To reduce her risk of distant cancer, I started her on antiestrogen therapy with letrozole in 08/2019.she is tolerating well  -She is behind on mammography. She states she is waiting for [facility] to set [facility] up. -She also notes she is scheduled to follow up with her surgeon this month. She would like to discuss a breast reduction. I will try to coordinate her mammogram and bone density for the same day at [facility] and she agrees. -She is clinically doing very well, exam was unremarkable, lab reviewed -Continue breast cancer surveillance -Follow-up in 6 months.  2. HTN, DMwith hyperglycemia, Obesity -On Amlodipine, lisinopril, metformin3 tabs once daily. -HTN controlledandpoorly controlled hyperglycemia. -I previously recommended low carb diet with more protein and vegetables, work on weight loss, check her BG before breakfast and 1-2 hours after lunch or dinner. I also recommend she f/u with her PCP for better DM management.  -I also previously referred her to [facility] clinic.   3.CKD stage III -secondary to DM and HTN. Will monitor  -Overall stable  4. Smoking Cessation  -She smoked for 10 years before she quit. She restarted after recent cancer diagnosis  -I strongly encouraged her to quit smoking completely and continue cessation. She understands and agrees. -She has been trying to quit smoking, still smokes a few cigarettes per day  5. Genetic Testingwasnegativefor pathogenetic mutations  6. B/l leg pain, neuropathy of feet, G1 -Likely  secondary to chemo  -Overall improved some, on gabapentin  7.  Subcutaneous cyst in left palm -asymptomatic, monitoring  -has seen orthopedic surgery but does not known when it can be removed.   PLAN: -Continue Letrozole  -lab and f/u in 6 months  -I will order mammogram and bone density scan, preferably to be done the same day at [facility] as her visit with her surgeon.   No problem-specific Assessment & Plan notes found for this encounter.   Orders Placed This Encounter  Procedures  . MM DIAG BREAST TOMO BILATERAL    Standing Status:   Future    Standing Expiration Date:   01/21/2022    Order Specific Question:   Reason for Exam (SYMPTOM  OR DIAGNOSIS REQUIRED)    Answer:   screening    Order Specific Question:   Preferred imaging location?    Answer:   [facility]    Order Specific Question:   Is the patient pregnant?    Answer:   No  . DG Bone Density    Standing Status:   Future    Standing Expiration Date:   01/21/2022    Order Specific Question:   Reason for Exam (SYMPTOM  OR DIAGNOSIS REQUIRED)    Answer:   screening    Order Specific Question:   Is the patient pregnant?    Answer:   No    Order Specific Question:   Preferred imaging location?    Answer:   [facility]   All questions were answered. The patient knows to call the clinic with any problems, questions or concerns.  No barriers to learning was detected. The total time spent in the appointment was 30 minutes.     Truitt Merle, MD 01/21/2021   I, Wilburn Mylar, am acting as scribe for Truitt Merle, MD.   I have reviewed the above documentation for accuracy and completeness, and I agree with the above.

## 2021-03-11 ENCOUNTER — Other Ambulatory Visit: Payer: Self-pay

## 2021-03-11 ENCOUNTER — Ambulatory Visit
Admission: RE | Admit: 2021-03-11 | Discharge: 2021-03-11 | Disposition: A | Discharge status: Home / Self Care | Payer: Medicare Other | Source: Ambulatory Visit | Attending: Hematology | Admitting: Hematology

## 2021-03-11 DIAGNOSIS — Z170 Estrogen receptor positive status [ER+]: Secondary | ICD-10-CM

## 2021-03-11 DIAGNOSIS — C50212 Malignant neoplasm of upper-inner quadrant of left female breast: Secondary | ICD-10-CM

## 2021-03-11 HISTORY — DX: Personal history of irradiation: Z92.3

## 2021-03-11 HISTORY — DX: Personal history of antineoplastic chemotherapy: Z92.21

## 2021-04-09 ENCOUNTER — Other Ambulatory Visit: Payer: Self-pay | Admitting: Hematology

## 2021-04-09 DIAGNOSIS — E2839 Other primary ovarian failure: Secondary | ICD-10-CM

## 2021-04-13 ENCOUNTER — Other Ambulatory Visit: Payer: Self-pay

## 2021-04-13 ENCOUNTER — Ambulatory Visit
Admission: RE | Admit: 2021-04-13 | Discharge: 2021-04-13 | Disposition: A | Discharge status: Home / Self Care | Payer: Medicare Other | Source: Ambulatory Visit | Attending: Hematology | Admitting: Hematology

## 2021-04-13 DIAGNOSIS — E2839 Other primary ovarian failure: Secondary | ICD-10-CM

## 2021-04-14 IMAGING — MG MM CLIP PLACEMENT
4 series · 4 of 12 positions shown · non-contrast
Comparison: Previous exam(s).

CLINICAL DATA: Stereotactic biopsy was performed of a
mass/distortion in the upper inner left breast, posterior third.

EXAM:
DIAGNOSTIC LEFT MAMMOGRAM POST STEREOTACTIC BIOPSY

[L CC synth-2D]
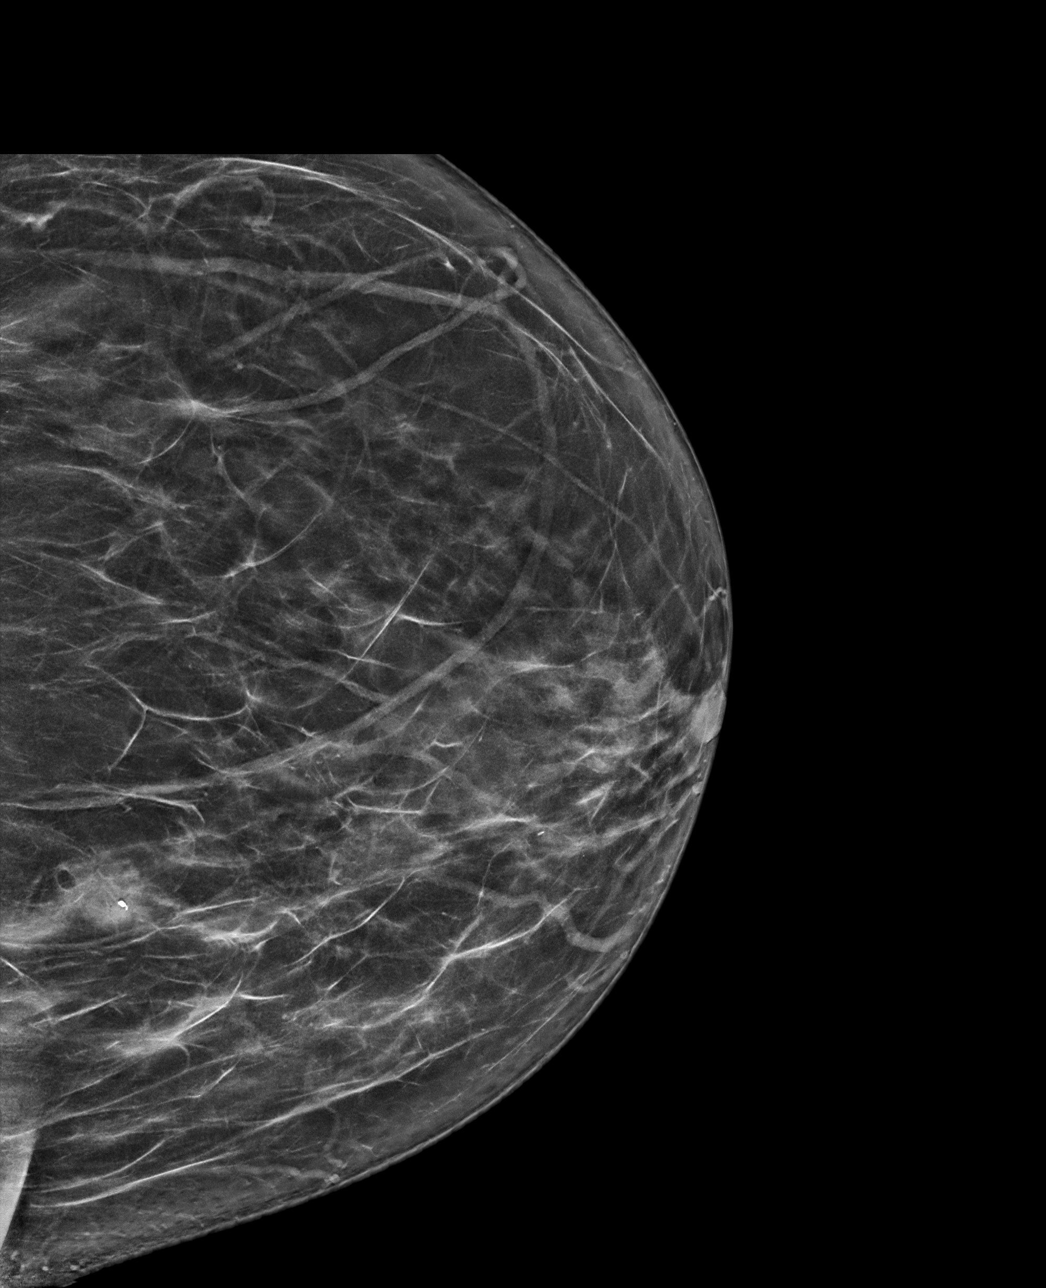

[L ML synth-2D]
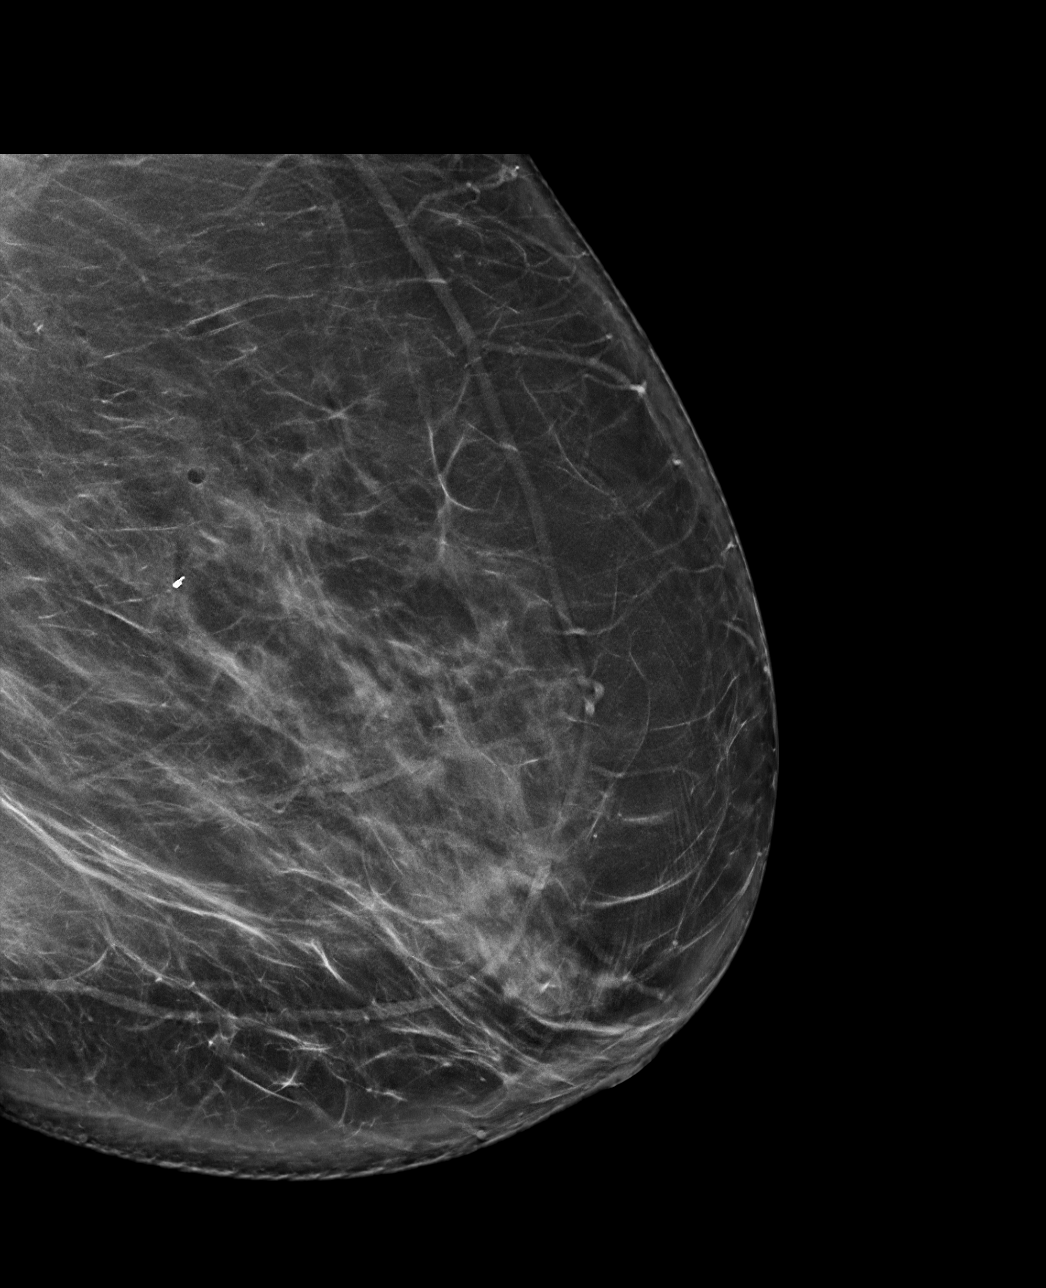

[L CC tomo · tomo slice 39/77.0]
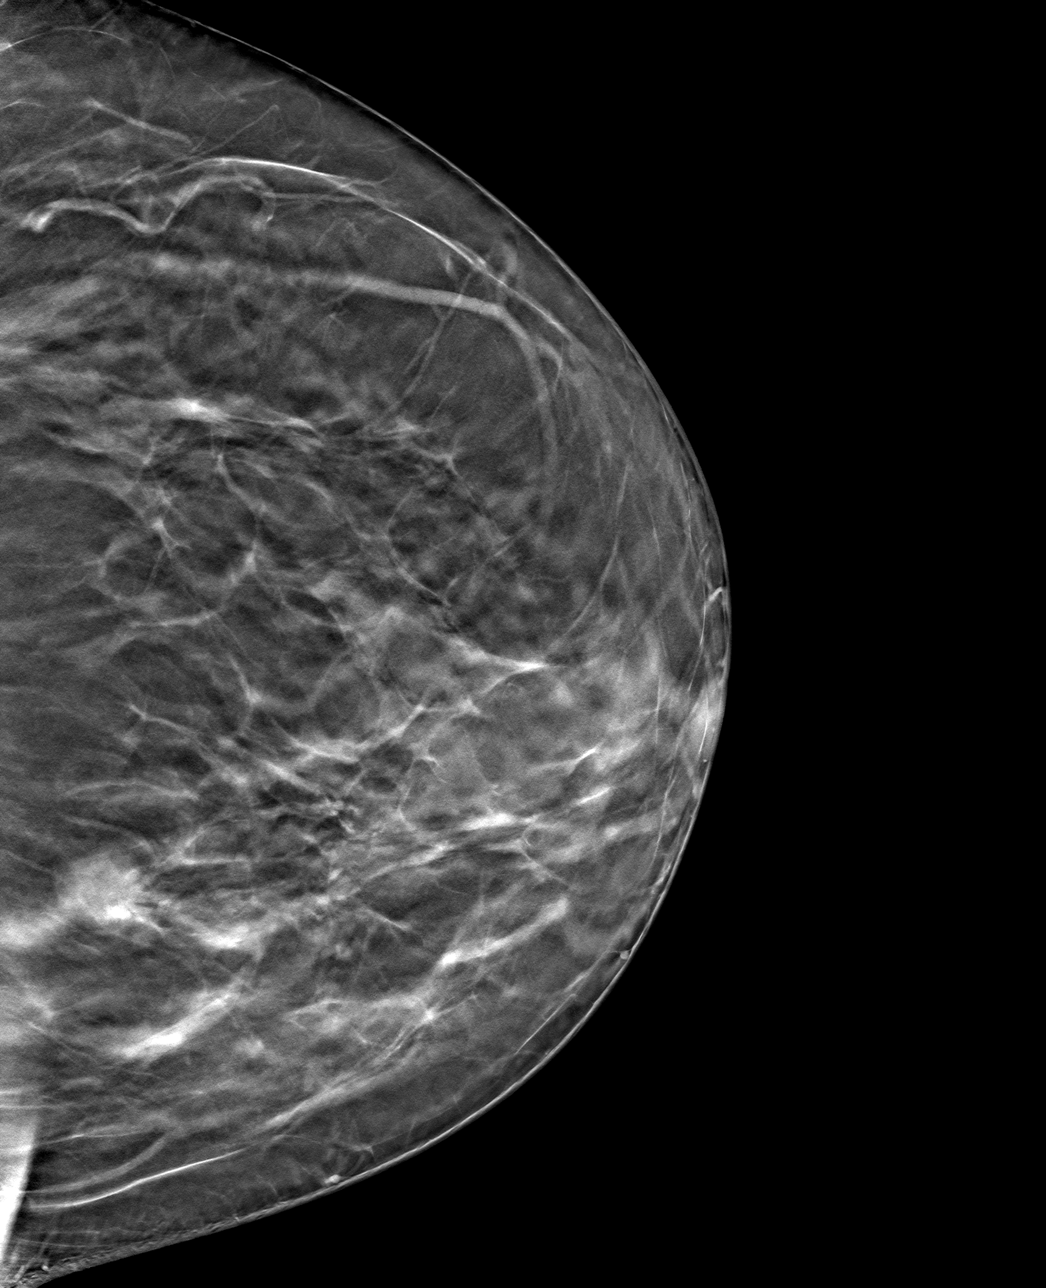

[L ML tomo · tomo slice 47/93.0]
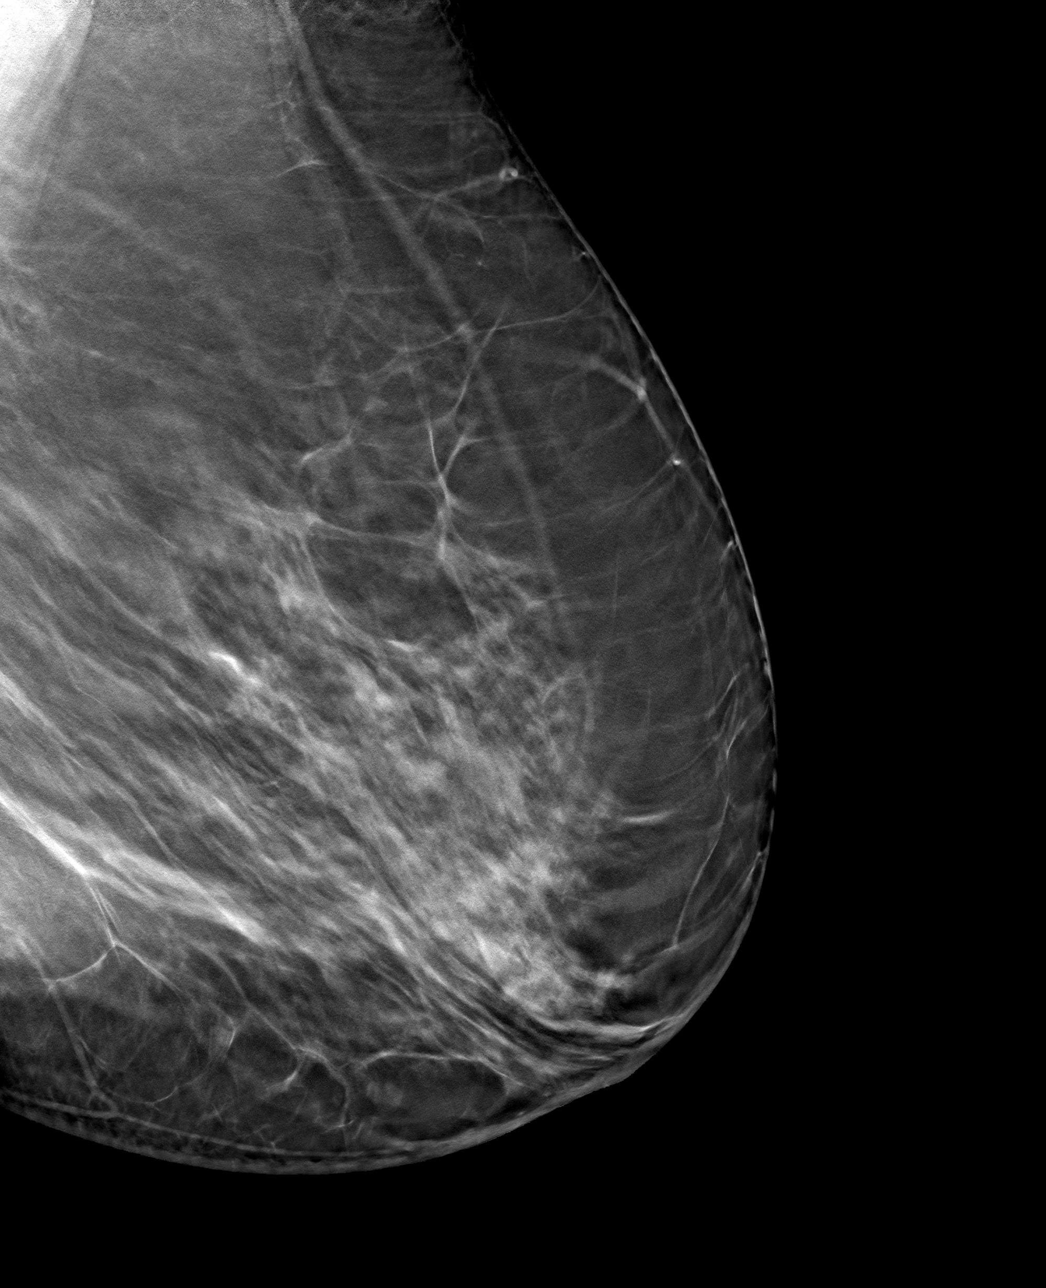

[4 of 12 positions shown; findings below may reference images not displayed]

FINDINGS: Mammographic images were obtained following stereotactic guided
biopsy of the left breast. A coil shaped biopsy clip is
satisfactorily positioned within the area of concern in the upper
inner quadrant.
IMPRESSION: Satisfactory position of coil shaped biopsy clip.

Final Assessment: Post Procedure Mammograms for Marker Placement

## 2021-05-05 IMAGING — MG NEEDLE LOCALIZATION OF THE LEFT BREAST WITH MAMMO GUIDANCE
2 series · 2 of 2 positions shown · non-contrast
Comparison: Previous exam(s).

CLINICAL DATA: Radioactive seed localization of the left breast
prior to lumpectomy.

EXAM:
MAMMOGRAPHIC GUIDED RADIOACTIVE SEED LOCALIZATION OF THE LEFT BREAST

[L CC]
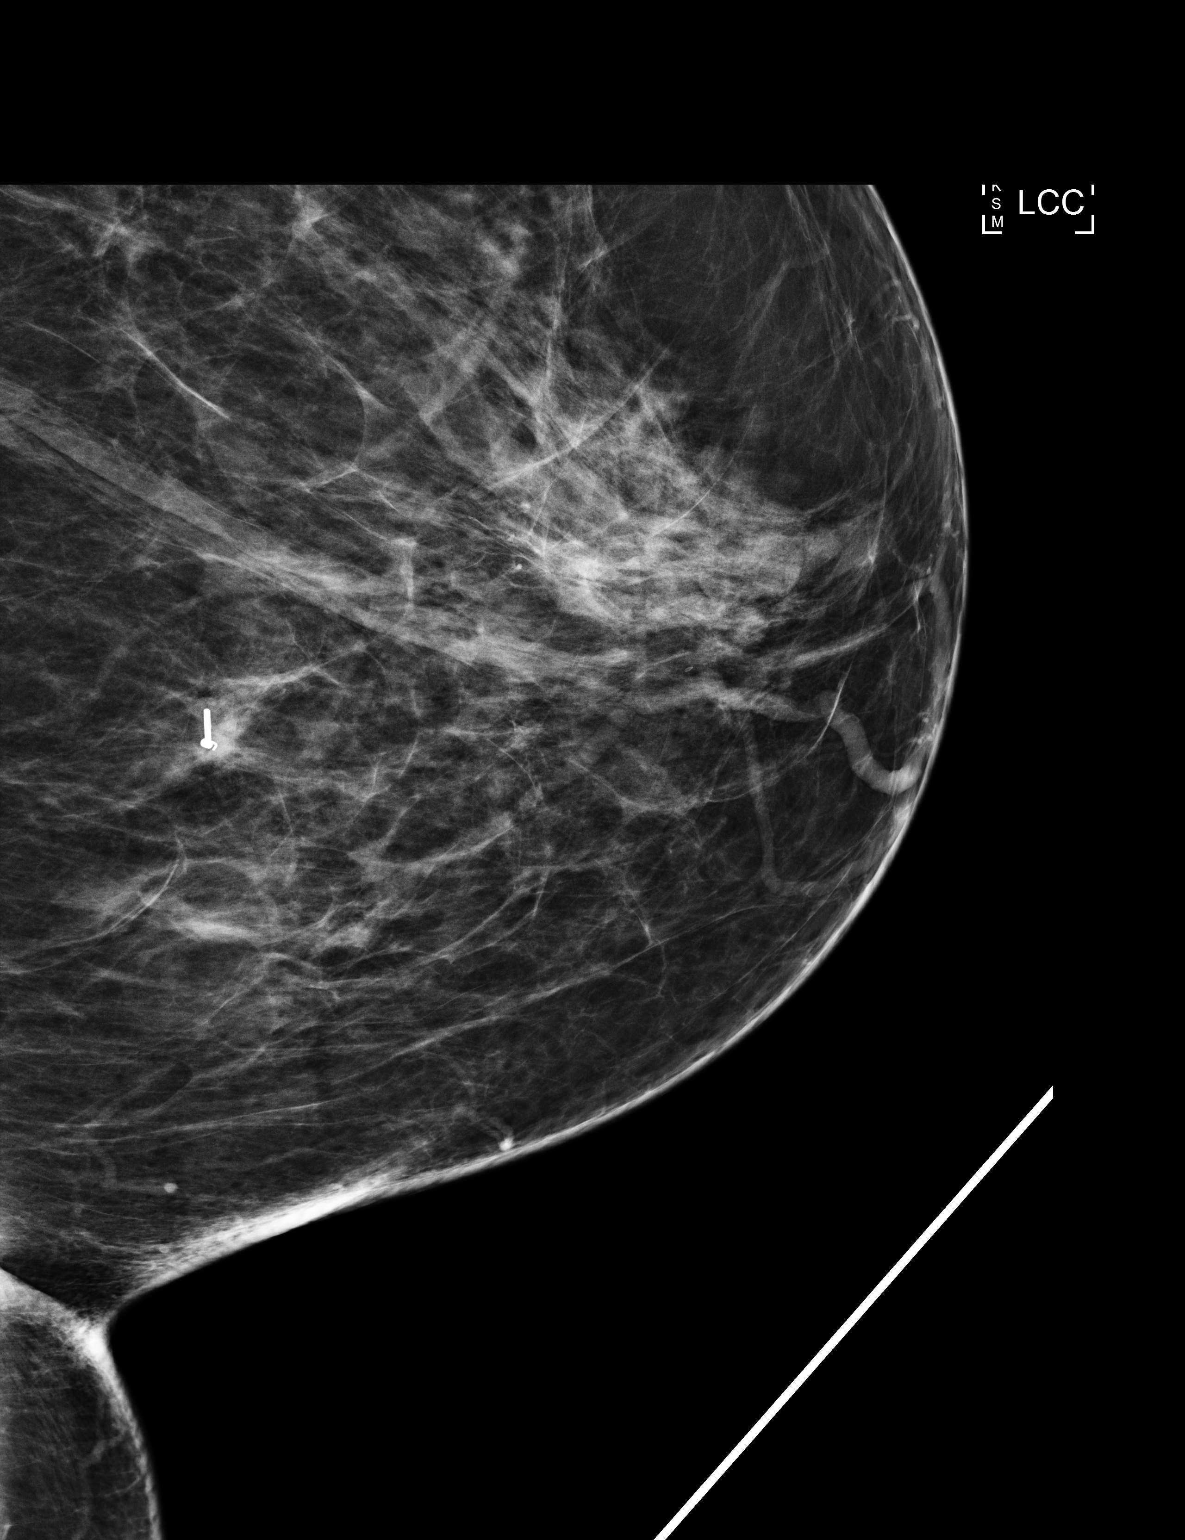

[L ML]
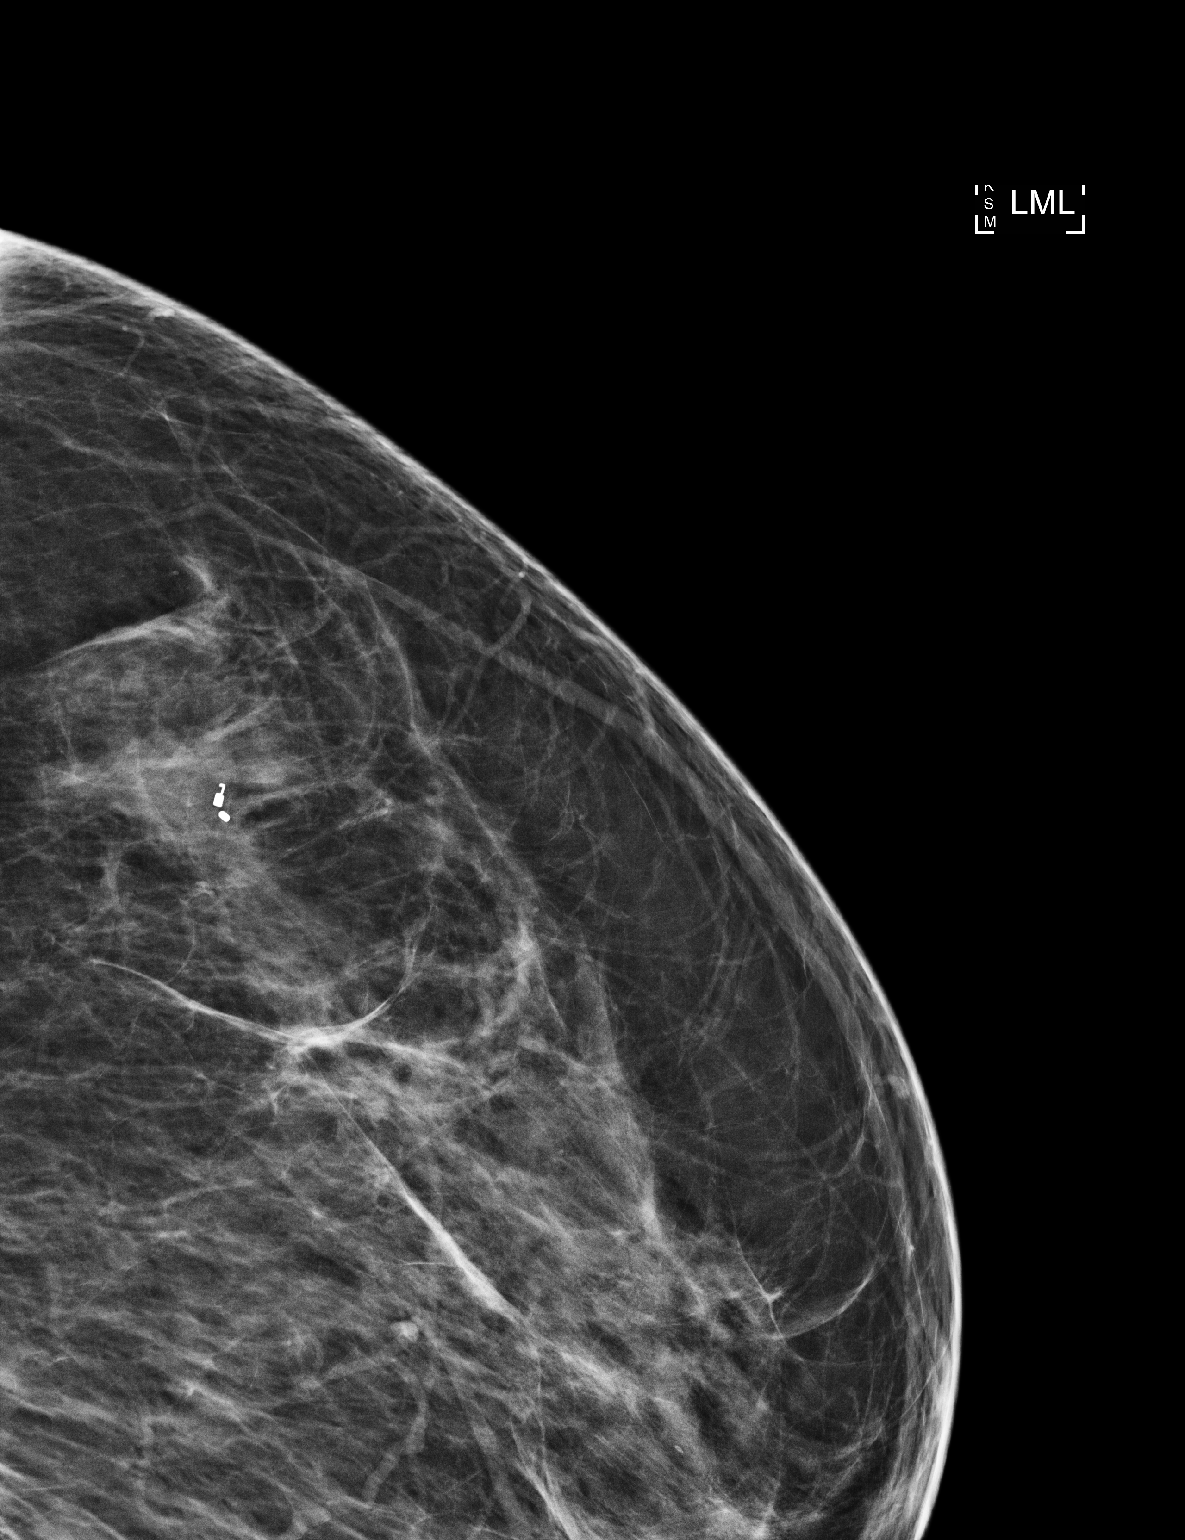

[2 of 2 positions shown; findings below may reference images not displayed]

FINDINGS: Patient presents for radioactive seed localization prior to left
breast lumpectomy. I met with the patient and we discussed the
procedure of seed localization including benefits and alternatives.
We discussed the high likelihood of a successful procedure. We
discussed the risks of the procedure including infection, bleeding,
tissue injury and further surgery. We discussed the low dose of
radioactivity involved in the procedure. Informed, written consent
was given.

The usual time-out protocol was performed immediately prior to the
procedure.

Using mammographic guidance, sterile technique, 1% lidocaine and an
6-H36 radioactive seed, the coil shaped biopsy marking clip in the
upper inner left breast was localized using a medial approach. The
follow-up mammogram images confirm the seed in the expected location
and were marked for Dr. Moolman.

Follow-up survey of the patient confirms presence of the radioactive
seed.

Order number of [id number] seed:  [id number].

Total activity:  0.252 millicuries reference Date: 12/03/2018

The patient tolerated the procedure well and was released from the
[REDACTED]. She was given instructions regarding seed removal.
IMPRESSION: Radioactive seed localization left breast. No apparent
complications.

## 2021-05-06 IMAGING — DX BREAST SURGICAL SPECIMEN
1 series · 2 of 2 positions shown · non-contrast
Comparison: Previous exam(s).

CLINICAL DATA: Status post breast conservation surgery today after
earlier radioactive seed localization.

EXAM:
SPECIMEN RADIOGRAPH OF THE LEFT BREAST

[Series 2: specimen digital x-ray, derived · left · 2 of 2 slices shown]
[im 1/2]
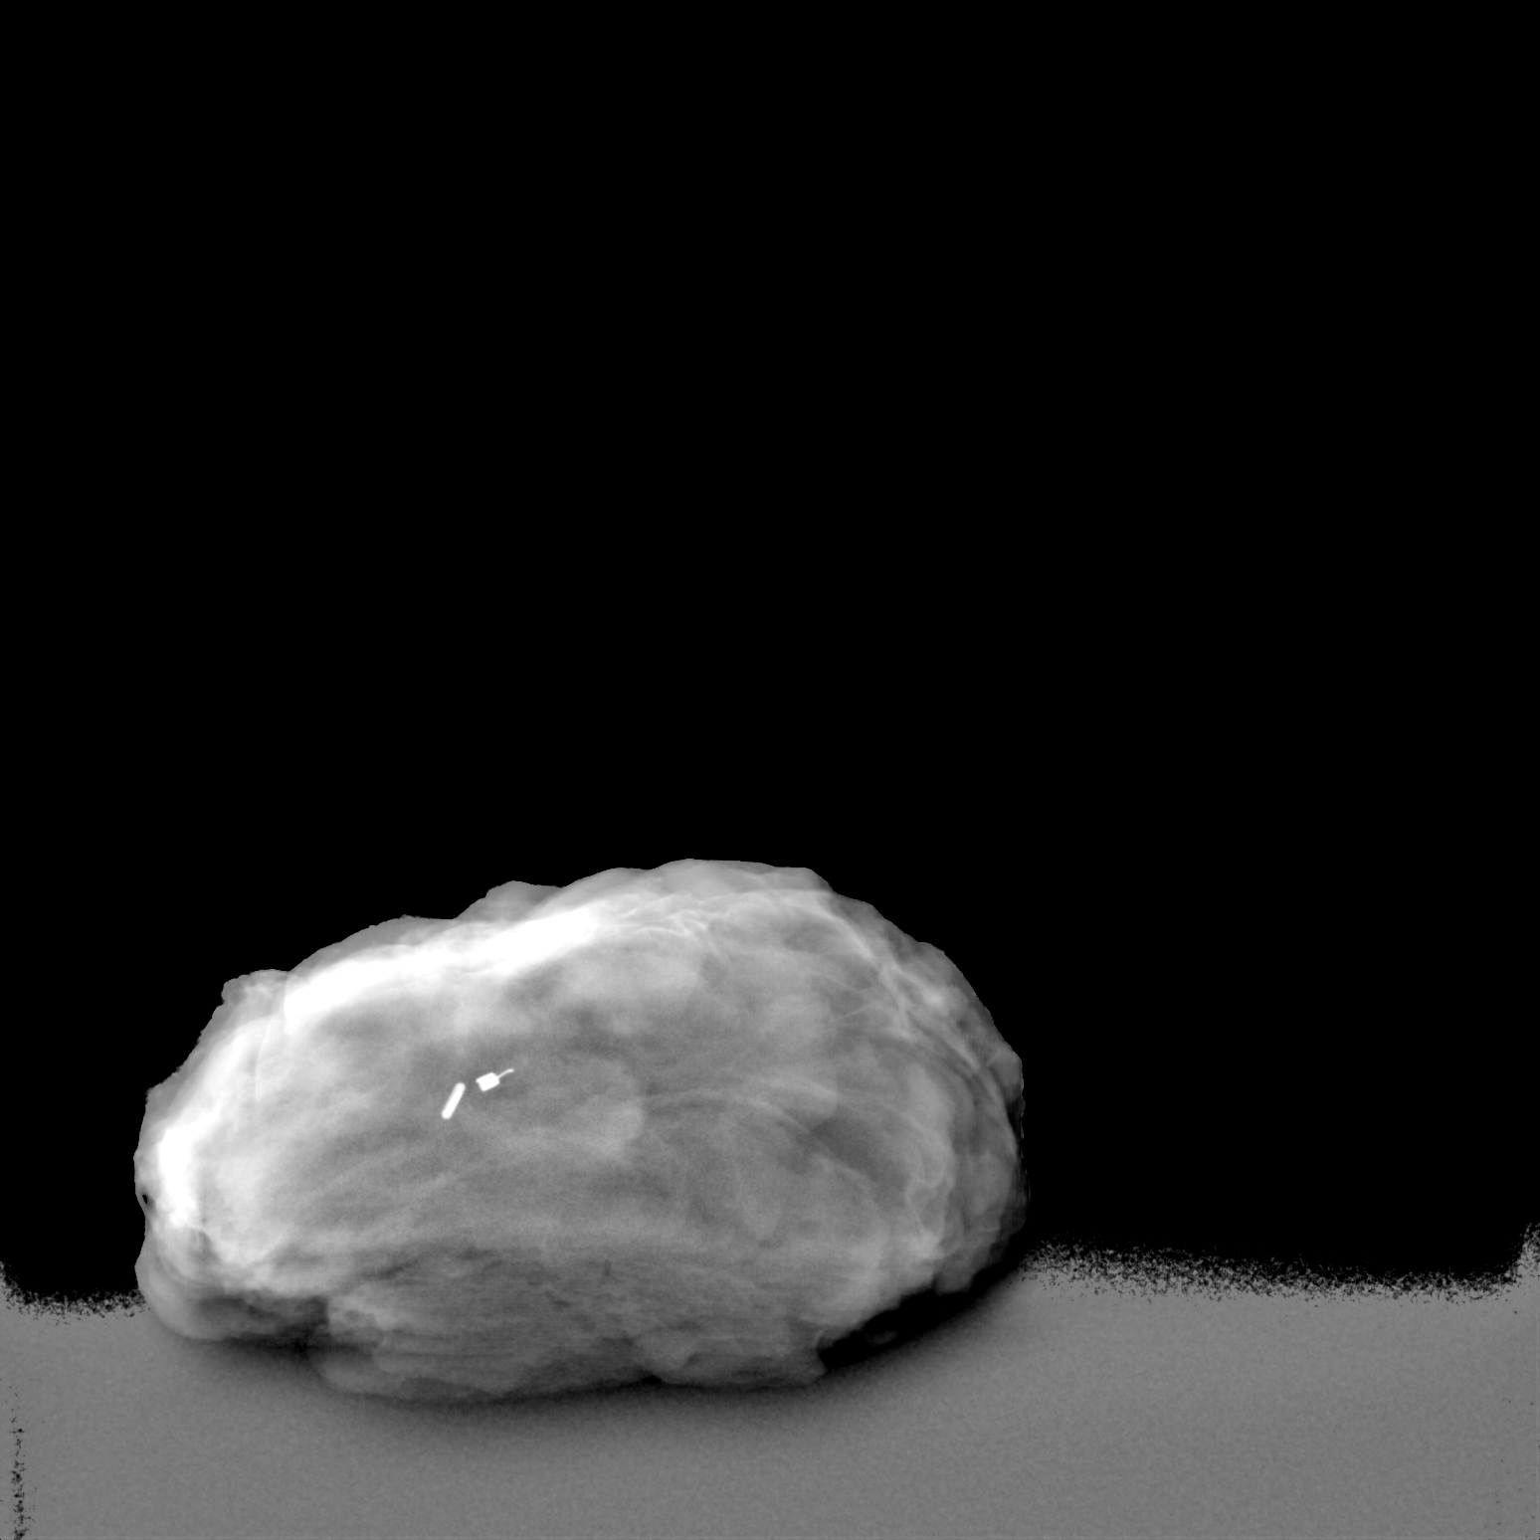
[im 2/2]
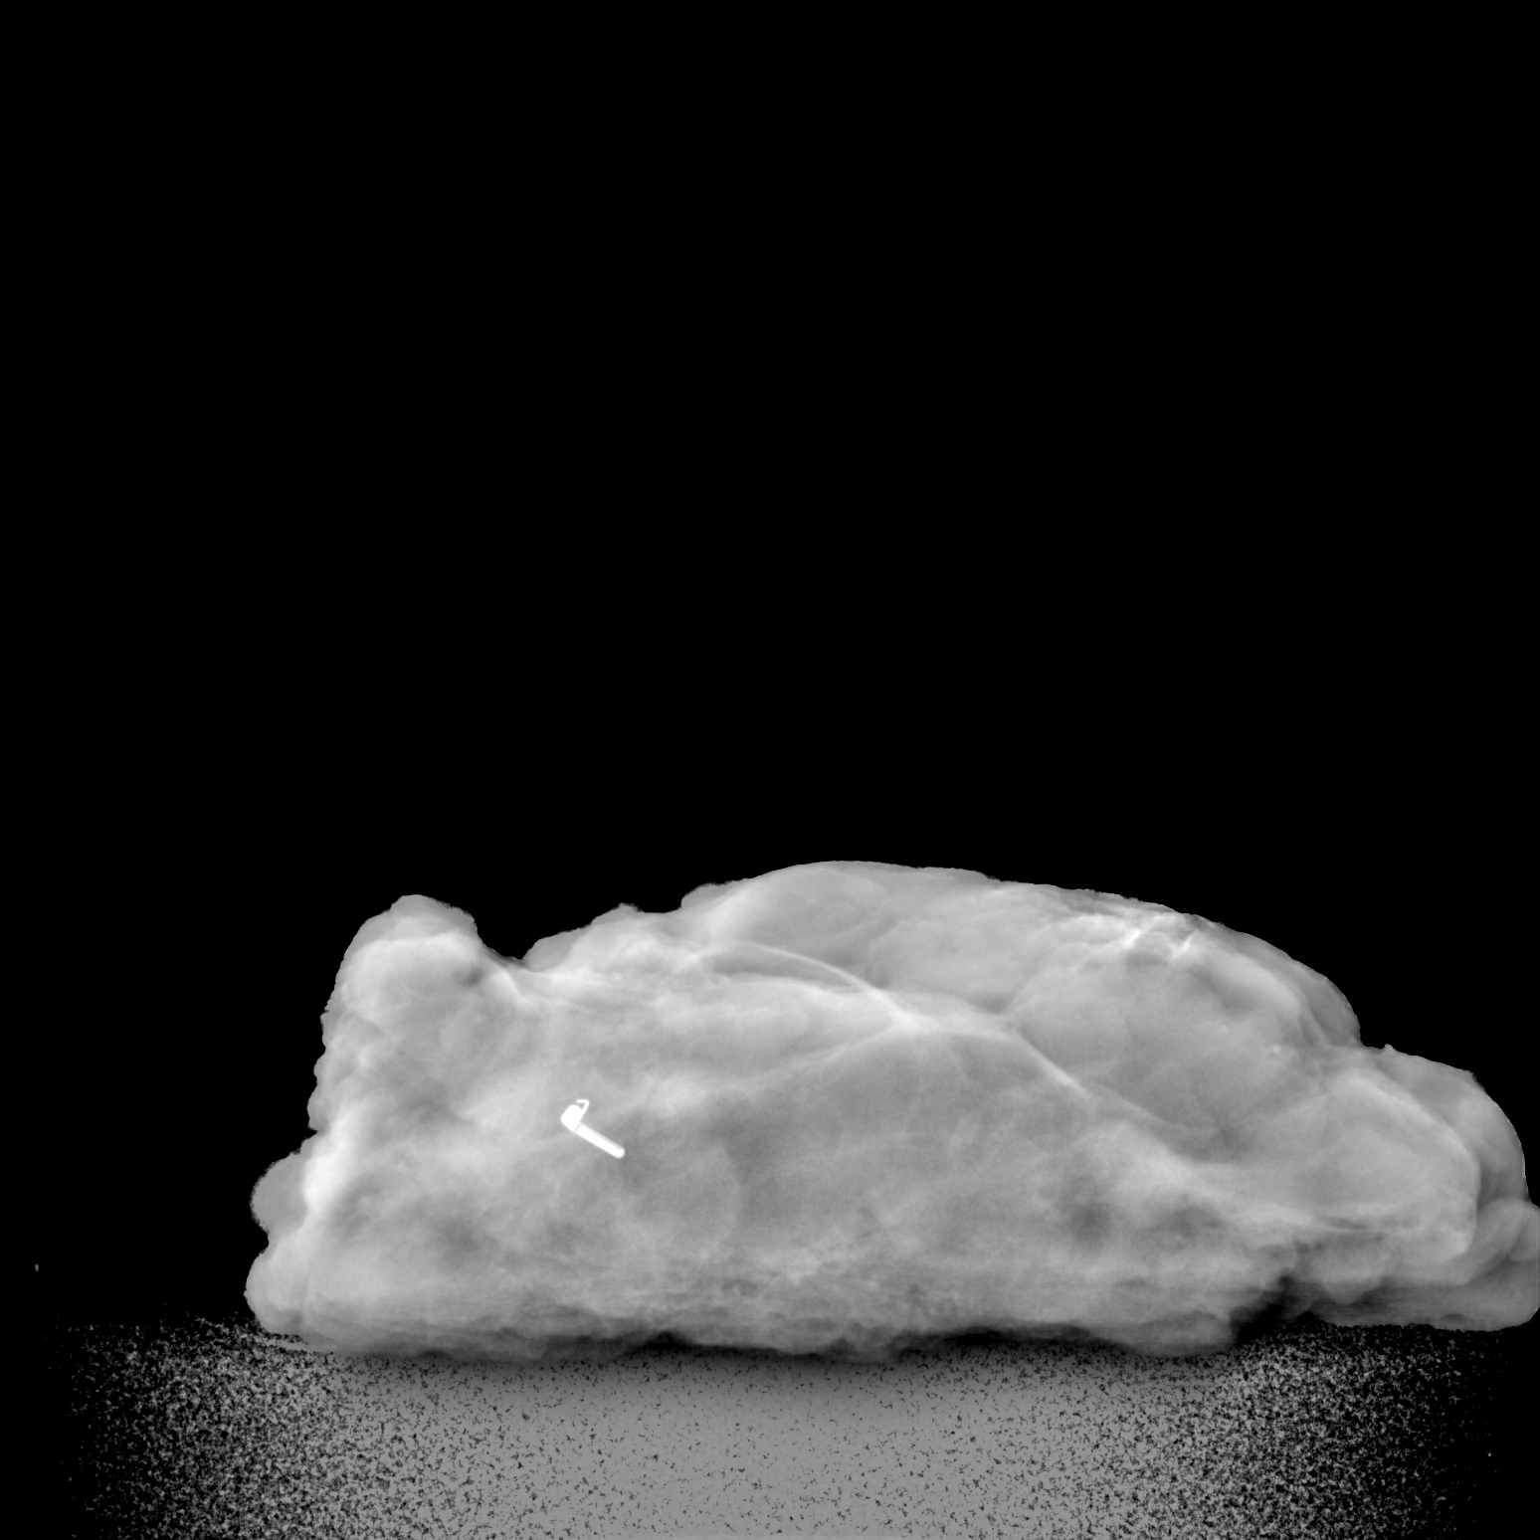

[2 of 2 positions shown; findings below may reference images not displayed]

FINDINGS: Status post excision of the left breast. The radioactive seed and
biopsy marker clip are present and completely intact within the
specimen.
IMPRESSION: Specimen radiograph of the left breast.

## 2021-05-30 ENCOUNTER — Other Ambulatory Visit: Payer: Self-pay | Admitting: Hematology

## 2021-06-27 IMAGING — DX PORTABLE CHEST - 1 VIEW
1 series · 1 of 1 positions shown · non-contrast
Comparison: None.

CLINICAL DATA: Port-A-Cath placement.

EXAM:
PORTABLE CHEST 1 VIEW

[chest ap]
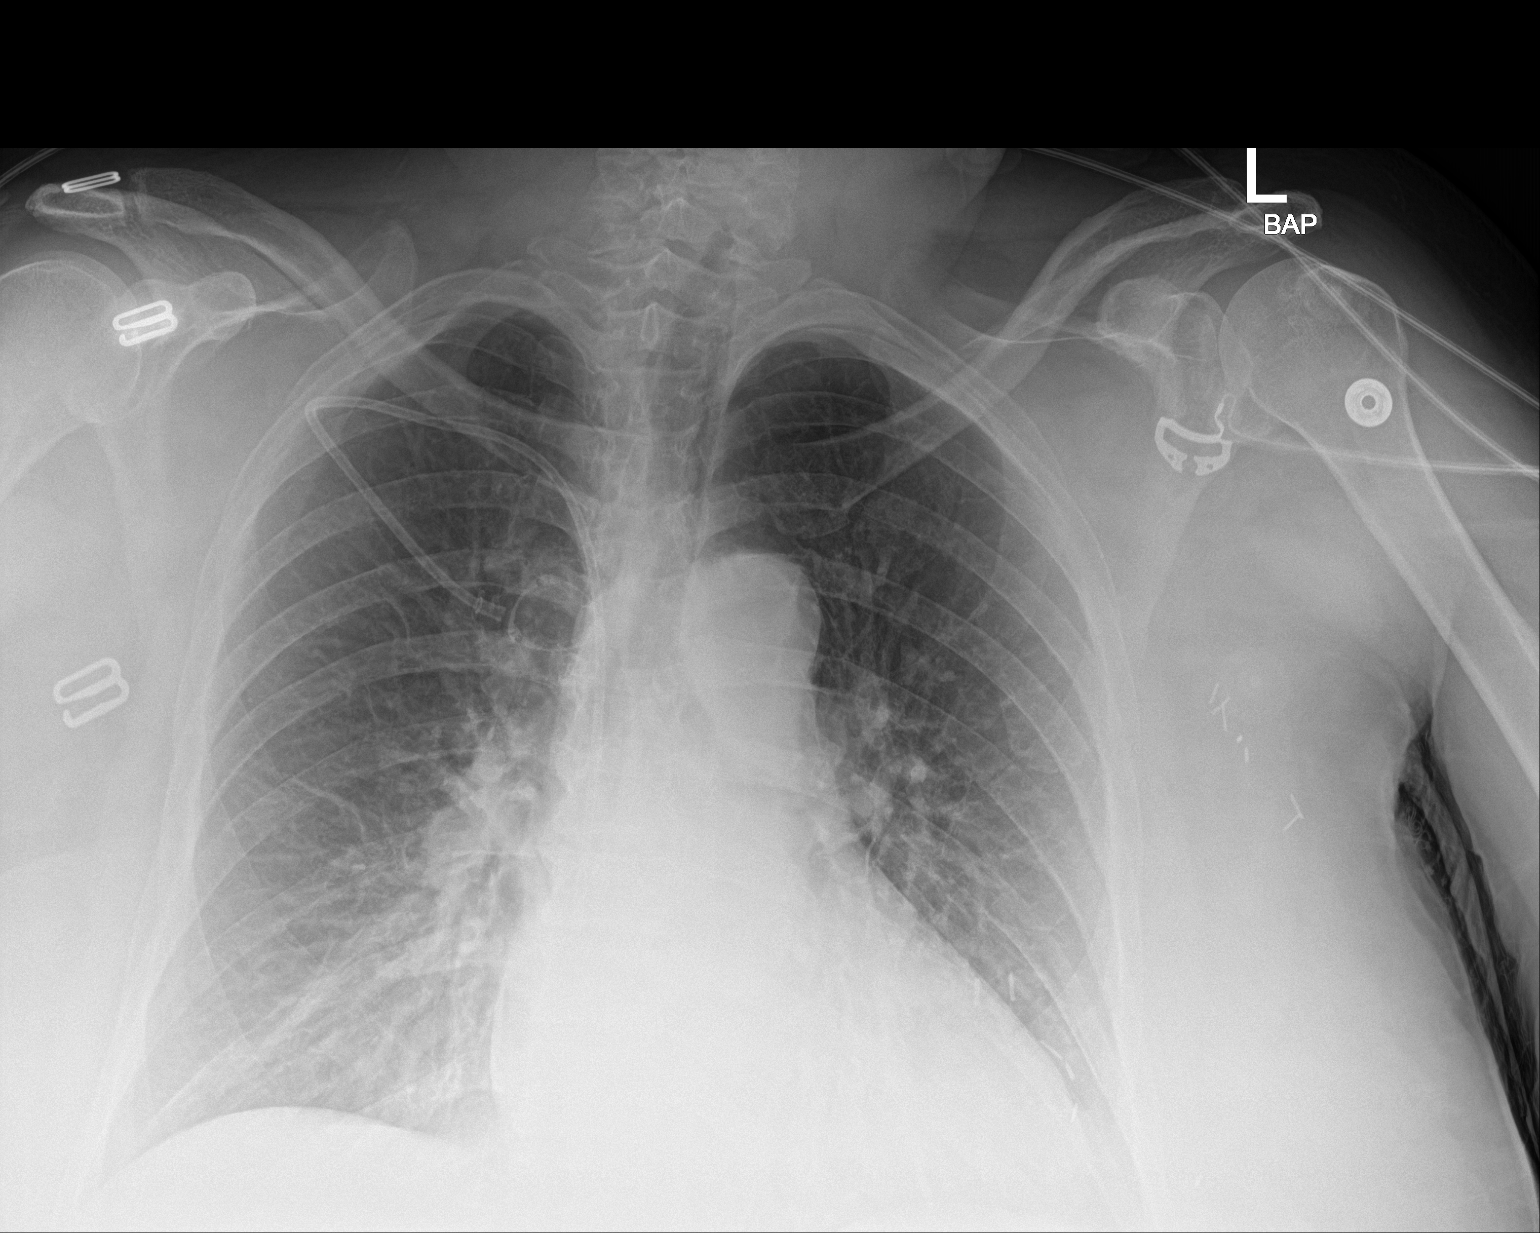

[1 of 1 positions shown; findings below may reference images not displayed]

FINDINGS: Is difficult to see the distal Port-A-Cath due 2 degree of
penetration. However, I believe the Port-A-Cath likely terminates in
the central SVC. The heart, hila, mediastinum, lungs, and pleura are
otherwise unremarkable. No pneumothorax. No focal infiltrate.
IMPRESSION: It is difficult to confidently see the distal tip of the Port-A-Cath
but it likely terminates in the central SVC. I do not definitely see
it extends into the right side of the heart. No pneumothorax.

## 2021-07-13 ENCOUNTER — Other Ambulatory Visit: Payer: Medicare Other

## 2021-07-23 ENCOUNTER — Other Ambulatory Visit: Payer: Self-pay

## 2021-07-23 ENCOUNTER — Inpatient Hospital Stay: Payer: Medicare Other | Admitting: Hematology

## 2021-07-23 ENCOUNTER — Inpatient Hospital Stay: Payer: Medicare Other | Attending: Hematology

## 2021-07-23 VITALS — BP 112/74 | HR 93 | Temp 98.2°F | Resp 19 | Ht 65.0 in | Wt 252.6 lb

## 2021-07-23 DIAGNOSIS — E1122 Type 2 diabetes mellitus with diabetic chronic kidney disease: Secondary | ICD-10-CM | POA: Diagnosis not present

## 2021-07-23 DIAGNOSIS — F17210 Nicotine dependence, cigarettes, uncomplicated: Secondary | ICD-10-CM | POA: Insufficient documentation

## 2021-07-23 DIAGNOSIS — Z90710 Acquired absence of both cervix and uterus: Secondary | ICD-10-CM | POA: Diagnosis not present

## 2021-07-23 DIAGNOSIS — Z170 Estrogen receptor positive status [ER+]: Secondary | ICD-10-CM | POA: Diagnosis not present

## 2021-07-23 DIAGNOSIS — E1140 Type 2 diabetes mellitus with diabetic neuropathy, unspecified: Secondary | ICD-10-CM | POA: Diagnosis not present

## 2021-07-23 DIAGNOSIS — N183 Chronic kidney disease, stage 3 unspecified: Secondary | ICD-10-CM | POA: Diagnosis not present

## 2021-07-23 DIAGNOSIS — C50212 Malignant neoplasm of upper-inner quadrant of left female breast: Secondary | ICD-10-CM

## 2021-07-23 DIAGNOSIS — I129 Hypertensive chronic kidney disease with stage 1 through stage 4 chronic kidney disease, or unspecified chronic kidney disease: Secondary | ICD-10-CM | POA: Insufficient documentation

## 2021-07-23 DIAGNOSIS — M8580 Other specified disorders of bone density and structure, unspecified site: Secondary | ICD-10-CM | POA: Diagnosis not present

## 2021-07-23 LAB — CBC WITH DIFFERENTIAL (CANCER CENTER ONLY)
Abs Immature Granulocytes: 0.03 10*3/uL (ref 0.00–0.07)
Basophils Absolute: 0 10*3/uL (ref 0.0–0.1)
Basophils Relative: 0 %
Eosinophils Absolute: 0.3 10*3/uL (ref 0.0–0.5)
Eosinophils Relative: 3 %
HCT: 43.1 % (ref 36.0–46.0)
Hemoglobin: 13.2 g/dL (ref 12.0–15.0)
Immature Granulocytes: 0 %
Lymphocytes Relative: 24 %
Lymphs Abs: 2.1 10*3/uL (ref 0.7–4.0)
MCH: 29.7 pg (ref 26.0–34.0)
MCHC: 30.6 g/dL (ref 30.0–36.0)
MCV: 96.9 fL (ref 80.0–100.0)
Monocytes Absolute: 0.5 10*3/uL (ref 0.1–1.0)
Monocytes Relative: 5 %
Neutro Abs: 6 10*3/uL (ref 1.7–7.7)
Neutrophils Relative %: 68 %
Platelet Count: 275 10*3/uL (ref 150–400)
RBC: 4.45 MIL/uL (ref 3.87–5.11)
RDW: 13.9 % (ref 11.5–15.5)
WBC Count: 8.9 10*3/uL (ref 4.0–10.5)
nRBC: 0 % (ref 0.0–0.2)

## 2021-07-23 LAB — CMP (CANCER CENTER ONLY)
ALT: 7 U/L (ref 0–44)
AST: 8 U/L — ABNORMAL LOW (ref 15–41)
Albumin: 4 g/dL (ref 3.5–5.0)
Alkaline Phosphatase: 87 U/L (ref 38–126)
Anion gap: 9 (ref 5–15)
BUN: 7 mg/dL (ref 6–20)
CO2: 25 mmol/L (ref 22–32)
Calcium: 9.1 mg/dL (ref 8.9–10.3)
Chloride: 106 mmol/L (ref 98–111)
Creatinine: 1.2 mg/dL — ABNORMAL HIGH (ref 0.44–1.00)
GFR, Estimated: 52 mL/min — ABNORMAL LOW (ref >60)
Glucose, Bld: 121 mg/dL — ABNORMAL HIGH (ref 70–99)
Potassium: 3.7 mmol/L (ref 3.5–5.1)
Sodium: 140 mmol/L (ref 135–145)
Total Bilirubin: 0.5 mg/dL (ref 0.3–1.2)
Total Protein: 7.1 g/dL (ref 6.5–8.1)

## 2021-07-23 NOTE — Progress Notes (Signed)
Emma Casey   [phone] [phone]) [phone]   Clinic Follow up Note   Patient Care Team: Leeanne Rio, MD as PCP - General (Family Medicine) Mauro Kaufmann, RN as Oncology Nurse Navigator Rockwell Germany, RN as Oncology Nurse Navigator Fanny Skates, MD as Consulting Physician (General Surgery) Truitt Merle, MD as Consulting Physician (Hematology) Kyung Rudd, MD as Consulting Physician (Radiation Oncology) Alla Feeling, NP as Nurse Practitioner (Nurse Practitioner)  Date of Service:  07/23/2021  CHIEF COMPLAINT: f/u of left breast cancer  CURRENT THERAPY:  Letrozole 2.3m starting 08/2019  ASSESSMENT & PLAN:  RTYLEA HISEis a 60y.o. female with   1. Malignant neoplasm of upper-inner quadrant of left breast, pT1cN0, stage I, ER/PR+,  HER2-, Grade III, Oncotype RS 27 -She was diagnosed in 11/2018. She underwent left breast lumpectomy and SLN biopsy on 01/08/19. Given positive margins she underwent left re-excision on 03/01/19. Oncotype showed RS 27, high risk.  -she completed 4 cycles of TC in 05/2019 and adjuvant Radiation.  -she started antiestrogen therapy with letrozole in 08/2019. she is tolerating well  -most recent MM on 03/11/21 was benign. -she is clinically doing well. Labs reviewed, CBC WNL, CMP pending. Physical exam is unremarkable. There is no clinical concern for recurrence.  2. Osteopenia -DEXA on 04/13/21 showed osteopenia (T-score -1.9). -she is taking vit D   3. HTN, DM with hyperglycemia, Obesity -On Amlodipine, lisinopril, metformin 3 tabs once daily.   -HTN controlled and poorly controlled hyperglycemia.  -I previously recommended low carb diet with more protein and vegetables, work on weight loss, check her BG before breakfast and 1-2 hours after lunch or dinner. I also recommend she f/u with her PCP for better DM management.  -[facility]    4. CKD stage  III -secondary to DM and HTN. Will monitor  -Overall stable   5. Smoking Cessation  -She smoked for 10 years before she quit. She restarted after recent cancer diagnosis  -I strongly encouraged her to quit smoking completely and continue cessation. She understands and agrees.  -She has been trying to quit smoking, still smokes a few cigarettes per day   6. Genetic Testing was negative for pathogenetic mutations   7. B/l leg pain, neuropathy of feet, G1 -Likely secondary to chemo  -Overall improved some, on gabapentin     PLAN:  -Continue Letrozole  -lab and f/u in 6 months    No problem-specific Assessment & Plan notes found for this encounter.   SUMMARY OF ONCOLOGIC HISTORY: Oncology History Overview Note  Cancer Staging Malignant neoplasm of upper-inner quadrant of left breast in female, estrogen receptor positive ([place] Staging form: Breast, AJCC 8th Edition - Clinical stage from 12/17/2018: Stage Unknown (cTX, cN0, cM0, G2, ER+, PR+, HER2-) - Signed by FTruitt Merle MD on 12/25/2018 - Pathologic stage from 01/08/2019: Stage IA (pT1c, pN0, cM0, G3, ER+, PR+, HER2-) - Signed by FTruitt Merle MD on 01/22/2019     Malignant neoplasm of upper-inner quadrant of left breast in [place], estrogen receptor positive ([facility]  11/28/2018 Mammogram   Diagnostic Mammogram 11/28/18 IMPRESSION:  Suspcious mass in upper inner quadrant of the left breast  at posterior depth.    12/17/2018 Initial Biopsy   Diagnosis 12/17/18 Breast, left, needle core biopsy, upper inner quadrant - INVASIVE DUCTAL CARCINOMA, SEE COMMENT. - DUCTAL CARCINOMA IN SITU.   12/17/2018 Cancer Staging   Staging form: Breast, AJCC 8th  Edition - Clinical stage from 12/17/2018: Stage Unknown (cTX, cN0, cM0, G2, ER+, PR+, HER2-) - Signed by Truitt Merle, MD on 12/25/2018    12/20/2018 Initial Diagnosis   Malignant neoplasm of upper-inner quadrant of left breast in female, estrogen receptor positive ([place])   12/20/2018 Receptors her2    Results: IMMUNOHISTOCHEMICAL AND MORPHOMETRIC ANALYSIS PERFORMED MANUALLY The tumor cells are NEGATIVE for Her2 (1+). Estrogen Receptor: 95%, POSITIVE, STRONG STAINING INTENSITY Progesterone Receptor: 30%, POSITIVE, STRONG STAINING INTENSITY Proliferation Marker Ki67: 20%   01/08/2019 Surgery   LEFT BREAST LUMPECTOMY WITH RADIOACTIVE SEED AND LEFT AXILLARY DEEP SENTINEL LYMPH NODE BIOPSY WITH BLUE DYE INJECTION by Dr [name] 01/08/19    01/08/2019 Pathology Results   Diagnosis 01/08/19 1. Breast, lumpectomy, left - INVASIVE DUCTAL CARCINOMA, GRADE III/III, SPANNING 1.8 CM. - DUCTAL CARCINOMA IN SITU, HIGH GRADE. - DUCTAL CARCINOMA IN SITU IS FOCALLY PRESENT AT THE ANTERIOR MARGIN OF SPECIMEN #1. - DUCTAL CARCINOMA IN SITU IS FOCALLY LESS THAN 0.1 CM FROM THE INFERIOR MARGIN OF SPECIMEN # 1. - SEE ONCOLOGY TABLE BELOW. 2. Breast, excision, left medial margin - DUCTAL CARCINOMA IN SITU, FOCALLY PRESENT AT THE MEDIAL MARGIN OF SPECIMEN # 2. 3. Breast, excision, left posterior margin - BENIGN BREAST PARENCHYMA. - THERE IS NO EVIDENCE OF MALIGNANCY. - SEE COMMENT. 4. Lymph node, sentinel, biopsy, left axillary - THERE IS NO EVIDENCE OF CARCINOMA IN 1 OF 1 LYMPH NODE (0/1). 5. Lymph node, sentinel, biopsy, left axillary - THERE IS NO EVIDENCE OF CARCINOMA IN 1 OF 1 LYMPH NODE (0/1). 6. Lymph node, sentinel, biopsy, left axillary - THERE IS NO EVIDENCE OF CARCINOMA IN 1 OF 1 LYMPH NODE (0/1)   01/08/2019 Oncotype testing   recurrence score of 27 with risk of recurrence 16% with Tamoxifen or AI alone. There is a benefit of chemotherapy >15%    01/08/2019 Cancer Staging   Staging form: Breast, AJCC 8th Edition - Pathologic stage from 01/08/2019: Stage IA (pT1c, pN0, cM0, G3, ER+, PR+, HER2-) - Signed by Truitt Merle, MD on 01/22/2019    01/29/2019 Genetic Testing   Negative genetic testing on the common hereditary cancer panel.  The Common Hereditary Gene Panel offered by [facility] includes sequencing  and/or deletion duplication testing of the following 48 genes: APC, ATM, AXIN2, BARD1, BMPR1A, BRCA1, BRCA2, BRIP1, CDH1, CDK4, CDKN2A (p14ARF), CDKN2A (p16INK4a), CHEK2, CTNNA1, DICER1, EPCAM (Deletion/duplication testing only), GREM1 (promoter region deletion/duplication testing only), KIT, MEN1, MLH1, MSH2, MSH3, MSH6, MUTYH, NBN, NF1, NHTL1, PALB2, PDGFRA, PMS2, POLD1, POLE, PTEN, RAD50, RAD51C, RAD51D, RNF43, SDHB, SDHC, SDHD, SMAD4, SMARCA4. STK11, TP53, TSC1, TSC2, and VHL.  The following genes were evaluated for sequence changes only: SDHA and HOXB13 c.251G>A variant only. The report date is January 29, 2019.   03/01/2019 Surgery   RE-EXCISION OF LEFT BREAST LUMPECTOMY MULITPLE MARGINS by Dr. [name]  Diagnosis 1. Breast, excision, re-excision left anterior margin - BENIGN BREAST PARENCHYMA WITH PREVIOUS PROCEDURE-RELATED CHANGES - NEGATIVE FOR IN SITU OR INVASIVE CARCINOMA 2. 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 SEE NOTE - DCIS IS FOCALLY LESS THAN 1 MM FROM NEW INFERIOR MARGIN - NEGATIVE FOR INVASIVE CARCINOMA - PREVIOUS PROCEDURE-RELATED CHANGES 3. Breast, excision, re-excision left medial margin - FOCAL DUCTAL CARCINOMA IN SITU (DCIS), 0.2 CM. SEE NOTE - DCIS IS 1.5 MM FROM NEW MEDIAL MARGIN - NEGATIVE FOR INVASIVE CARCINOMA - PREVIOUS PROCEDURE-RELATED CHANGES 4. Breast, excision, re-excision left final medial argin - BENIGN BREAST PARENCHYMA WITH MILD  FOCAL PREVIOUS PROCEDURE-RELATED CHANGES - NEGATIVE FOR IN SITU OR INVASIVE CARCINOMA    03/22/2019 - 05/24/2019 Chemotherapy   TC q3weeks for 4 cycles starting 03/22/19 and completed on 05/24/19    06/24/2019 - 08/09/2019 Radiation Therapy   Radiation therapy with Dr. Lisbeth Renshaw 06/24/19-08/09/19   08/2019 -  Anti-estrogen oral therapy   Letrozole 2.57m starting 08/2019   10/23/2019 Survivorship   SCP delivered by LCira Rue NP       INTERVAL HISTORY:  RSHEMAIAH ROUND is here for a follow up of breast cancer. She was last seen by me on 01/21/21. She presents to the clinic alone. She reports some pain in her breast, but she was reassured by her cousins with previous breast cancer that this was normal. She denies any issues from letrozole. She notes issues buying bras that fit both of her breasts.   All other systems were reviewed with the patient and are negative.  MEDICAL HISTORY:  Past Medical History:  Diagnosis Date   Anxiety    Arthritis    right hand   Back pain    [place] [place]    Bron[place]itis    [place]   left breast ca   Chronic fatigue syndrome    Constipation    Depression    Diabetes mellitus without complication (HCC)    Family history of breast cancer    GERD (gastroesophageal reflux disease)    Headache    Heart murmur    Herpes 1&2    Hyperlipidemia    Hypertension    Joint pain    Leg fracture, left    as a child   Lymphedema    Multiple food allergies    OSA on CPAP    has CPAP but has not used in over a month   Personal history of chemotherapy    Personal history of radiation therapy    Prediabetes    Shortness of breath     SURGICAL HISTORY: Past Surgical History:  Procedure Laterality Date   ABDOMINAL HYSTERECTOMY     BREAST LUMPECTOMY WITH RADIOACTIVE SEED AND SENTINEL LYMPH NODE BIOPSY Left 01/08/2019   Procedure: LEFT BREAST LUMPECTOMY WITH RADIOACTIVE SEED AND LEFT AXILLARY DEEP SENTINEL LYMPH NODE BIOPSY WITH BLUE DYE INJECTION;  Surgeon: IFanny Skates MD;  [place] [facility]  Service: General;  Laterality: Left;   COLONOSCOPY N/A 09/02/2015   Procedure: COLONOSCOPY;  Surgeon: SDanie Binder MD;  [facility];  Service: Endoscopy;  Laterality: N/A;  11:15 Am   EYE SURGERY Right    cataract   HAND SURGERY Right    broken bone   PORT-A-CATH REMOVAL Right 08/21/2019   Procedure: REMOVAL PORT-A-CATH;  Surgeon: IFanny Skates MD;  [place] [facility]  Service: General;  Laterality: Right;   PORTACATH PLACEMENT N/A 03/01/2019   Procedure: INSERTION PORT-A-CATH;  Surgeon: IFanny Skates MD;  [place] [facility]  Service: General;  Laterality: N/A;   RE-EXCISION OF BREAST LUMPECTOMY Left 03/01/2019   Procedure: RE-EXCISION OF LEFT BREAST LUMPECTOMY MULITPLE MARGINS;  Surgeon: IFanny Skates MD;  [facility]  Service: General;  Laterality: Left;   TUBAL LIGATION      I have reviewed the social history and family history with the patient and they are unchanged from previous note.  ALLERGIES:  is allergic to asa [aspirin], nsaids, and penicillins.  MEDICATIONS:  Current Outpatient Medications  Medication Sig Dispense Refill   acyclovir (ZOVIRAX) 400 MG tablet  Take 400 mg by mouth 2 (two) times daily.     amLODipine (NORVASC) 5 MG tablet Take 5 mg by mouth daily.     Ascorbic Acid (VITAMIN C) 1000 MG tablet Take 1,000 mg by mouth daily.     atorvastatin (LIPITOR) 20 MG tablet Take 20 mg by mouth daily.     Cholecalciferol (VITAMIN D) 50 MCG (2000 UT) CAPS Take 2,000 Units by mouth daily.     diphenhydrAMINE (ALLERGY RELIEF) 25 MG tablet Take 25 mg by mouth every 6 (six) hours as needed.     Dulaglutide (TRULICITY) 0.08 QP/6.1PJ SOPN Inject 0.5 mLs (0.75 mg total) into the skin once a week. (Patient not taking: Reported on 05/13/2020) 2 mL 0   fluticasone (FLONASE) 50 MCG/ACT nasal spray Place 2 sprays into both nostrils daily.      folic acid (FOLVITE) 093 MCG tablet Take 400 mcg by mouth daily.      gabapentin (NEURONTIN) 300 MG capsule TAKE 1 CAPSULE BY MOUTH  TWICE DAILY 180 capsule 3   lactulose (CHRONULAC) 10 GM/15ML solution Take 15 mLs (10 g total) by mouth 2 (two) times daily. 236 mL 0   letrozole (FEMARA) 2.5 MG tablet TAKE 1 TABLET BY MOUTH  DAILY 90 tablet 3   lisinopril (ZESTRIL) 2.5 MG tablet Take 2.5 mg by mouth at bedtime.     loratadine (CLARITIN) 10 MG tablet Take 10 mg by mouth daily.     metFORMIN  (GLUCOPHAGE-XR) 500 MG 24 hr tablet      montelukast (SINGULAIR) 10 MG tablet Take 10 mg by mouth at bedtime.     Multiple Vitamin (MULTIVITAMIN) capsule Take 1 capsule by mouth daily.     omeprazole (PRILOSEC) 20 MG capsule Take 20 mg by mouth daily.     ondansetron (ZOFRAN) 8 MG tablet Take 1 tablet (8 mg total) by mouth 2 (two) times daily as needed for refractory nausea / vomiting. Start on day 3 after chemo. 30 tablet 1   PARoxetine (PAXIL) 10 MG tablet Take 10 mg by mouth at bedtime.     potassium chloride (MICRO-K) 10 MEQ CR capsule Take 10 mEq by mouth 2 (two) times daily.     prochlorperazine (COMPAZINE) 10 MG tablet Take 1 tablet (10 mg total) by mouth every 6 (six) hours as needed (Nausea or vomiting). 30 tablet 1   tiZANidine (ZANAFLEX) 4 MG tablet Take 4 mg by mouth 3 (three) times daily.     topiramate (TOPAMAX) 100 MG tablet Take 100 mg by mouth daily.     vitamin B-12 (CYANOCOBALAMIN) 500 MCG tablet Take 500 mcg by mouth daily.      Vitamin D, Ergocalciferol, (DRISDOL) 1.25 MG (50000 UNIT) CAPS capsule Take 1 capsule (50,000 Units total) by mouth every 7 (seven) days. (Patient not taking: Reported on 05/13/2020) 4 capsule 0   No current facility-administered medications for this visit.    PHYSICAL EXAMINATION: ECOG PERFORMANCE STATUS: 1 - Symptomatic but completely ambulatory  There were no vitals filed for this visit. Wt Readings from Last 3 Encounters:  01/21/21 264 lb 8 oz (120 kg)  07/23/20 262 lb 8 oz (119.1 kg)  05/13/20 260 lb (117.9 kg)     GENERAL:alert, no distress and comfortable SKIN: skin color, texture, turgor are normal, no rashes or significant lesions EYES: normal, Conjunctiva are pink and non-injected, sclera clear  NECK: supple, thyroid normal size, non-tender, without nodularity LYMPH:  no palpable lymphadenopathy in the cervical, axillary  LUNGS: clear to auscultation  and percussion with normal breathing effort HEART: regular rate & rhythm and no  murmurs and no lower extremity edema ABDOMEN:abdomen soft, non-tender and normal bowel sounds Musculoskeletal:no cyanosis of digits and no clubbing  NEURO: alert & oriented x 3 with fluent speech, no focal motor/sensory deficits BREAST: No palpable mass, nodules or adenopathy bilaterally. Breast exam benign.   LABORATORY DATA:  I have reviewed the data as listed CBC Latest Ref Rng & Units 01/21/2021 07/23/2020 04/15/2020  WBC 4.0 - 10.5 K/uL 7.1 7.8 7.7  Hemoglobin 12.0 - 15.0 g/dL 12.5 12.9 14.0  Hematocrit 36.0 - 46.0 % 40.7 42.0 44.4  Platelets 150 - 400 K/uL 289 284 276     CMP Latest Ref Rng & Units 01/21/2021 07/23/2020 04/15/2020  Glucose 70 - 99 mg/dL 214(H) 222(H) 150(H)  BUN 6 - 20 mg/dL _0 Creatinine 0.44 - 1.00 mg/dL 1.34(H) 1.41(H) 1.24(H)  Sodium 135 - 145 mmol/L 140 141 142  Potassium 3.5 - 5.1 mmol/L 3.8 3.5 3.9  Chloride 98 - 111 mmol/L 102 105 100  CO2 22 - 32 mmol/L _1 Calcium 8.9 - 10.3 mg/dL 9.7 9.7 10.0  Total Protein 6.5 - 8.1 g/dL 7.1 7.2 7.4  Total Bilirubin 0.3 - 1.2 mg/dL 0.8 0.5 0.8  Alkaline Phos 38 - 126 U/L 96 84 113  AST 15 - 41 U/L 13(L) 10(L) 13  ALT 0 - 44 U/L _2 RADIOGRAPHIC STUDIES: I have personally reviewed the radiological images as listed and agreed with the findings in the report. No results found.    No orders of the defined types were placed in this encounter.  All questions were answered. The patient knows to call the clinic with any problems, questions or concerns. No barriers to learning was detected. The total time spent in the appointment was 25 minutes.     Truitt Merle, MD 07/23/2021   I, Wilburn Mylar, am acting as scribe for Truitt Merle, MD.   I have reviewed the above documentation for accuracy and completeness, and I agree with the above.

## 2021-07-23 NOTE — Progress Notes (Signed)
CBC w/Diff and CMP entered.

## 2021-07-25 ENCOUNTER — Encounter: Payer: Self-pay | Admitting: Hematology

## 2021-07-26 ENCOUNTER — Telehealth: Payer: Self-pay | Admitting: Hematology

## 2021-07-26 NOTE — Telephone Encounter (Signed)
Scheduled follow-up appointment per 12/2 los. Patient is aware.

## 2021-08-27 ENCOUNTER — Other Ambulatory Visit: Payer: Self-pay | Admitting: Nurse Practitioner

## 2021-08-30 MED ORDER — LACTULOSE 10 GM/15ML PO SOLN: 10.0000 g | Freq: Two times a day (BID) | ORAL | 0 refills | Status: DC

## 2021-09-08 ENCOUNTER — Other Ambulatory Visit: Payer: Self-pay | Admitting: Orthopedic Surgery

## 2021-09-22 ENCOUNTER — Encounter (HOSPITAL_BASED_OUTPATIENT_CLINIC_OR_DEPARTMENT_OTHER): Payer: Self-pay | Admitting: Orthopedic Surgery

## 2021-09-22 ENCOUNTER — Other Ambulatory Visit: Payer: Self-pay

## 2021-09-28 ENCOUNTER — Other Ambulatory Visit: Payer: Self-pay

## 2021-09-28 ENCOUNTER — Encounter (HOSPITAL_BASED_OUTPATIENT_CLINIC_OR_DEPARTMENT_OTHER)
Admission: RE | Admit: 2021-09-28 | Discharge: 2021-09-28 | Disposition: A | Discharge status: Home / Self Care | Payer: Medicare Other | Source: Ambulatory Visit | Attending: Orthopedic Surgery | Admitting: Orthopedic Surgery

## 2021-09-28 DIAGNOSIS — Z6841 Body Mass Index (BMI) 40.0 and over, adult: Secondary | ICD-10-CM | POA: Diagnosis not present

## 2021-09-28 DIAGNOSIS — E1136 Type 2 diabetes mellitus with diabetic cataract: Secondary | ICD-10-CM | POA: Diagnosis not present

## 2021-09-28 DIAGNOSIS — I10 Essential (primary) hypertension: Secondary | ICD-10-CM | POA: Diagnosis not present

## 2021-09-28 DIAGNOSIS — F17200 Nicotine dependence, unspecified, uncomplicated: Secondary | ICD-10-CM | POA: Diagnosis not present

## 2021-09-28 DIAGNOSIS — G4730 Sleep apnea, unspecified: Secondary | ICD-10-CM | POA: Diagnosis not present

## 2021-09-28 DIAGNOSIS — F32A Depression, unspecified: Secondary | ICD-10-CM | POA: Diagnosis not present

## 2021-09-28 DIAGNOSIS — D2362 Other benign neoplasm of skin of left upper limb, including shoulder: Secondary | ICD-10-CM | POA: Diagnosis not present

## 2021-09-28 DIAGNOSIS — K219 Gastro-esophageal reflux disease without esophagitis: Secondary | ICD-10-CM | POA: Diagnosis not present

## 2021-09-28 DIAGNOSIS — F419 Anxiety disorder, unspecified: Secondary | ICD-10-CM | POA: Diagnosis not present

## 2021-09-28 DIAGNOSIS — Z01810 Encounter for preprocedural cardiovascular examination: Secondary | ICD-10-CM | POA: Insufficient documentation

## 2021-09-28 DIAGNOSIS — R2232 Localized swelling, mass and lump, left upper limb: Secondary | ICD-10-CM | POA: Diagnosis present

## 2021-09-28 DIAGNOSIS — E669 Obesity, unspecified: Secondary | ICD-10-CM | POA: Diagnosis not present

## 2021-09-28 DIAGNOSIS — M1990 Unspecified osteoarthritis, unspecified site: Secondary | ICD-10-CM | POA: Diagnosis not present

## 2021-09-28 LAB — BASIC METABOLIC PANEL
Anion gap: 8 (ref 5–15)
BUN: <5 mg/dL — ABNORMAL LOW (ref 8–23)
CO2: 28 mmol/L (ref 22–32)
Calcium: 9.4 mg/dL (ref 8.9–10.3)
Chloride: 105 mmol/L (ref 98–111)
Creatinine, Ser: 1.26 mg/dL — ABNORMAL HIGH (ref 0.44–1.00)
GFR, Estimated: 49 mL/min — ABNORMAL LOW (ref >60)
Glucose, Bld: 121 mg/dL — ABNORMAL HIGH (ref 70–99)
Potassium: 4.3 mmol/L (ref 3.5–5.1)
Sodium: 141 mmol/L (ref 135–145)

## 2021-09-28 NOTE — Progress Notes (Signed)

## 2021-10-01 ENCOUNTER — Other Ambulatory Visit: Payer: Self-pay

## 2021-10-01 ENCOUNTER — Encounter (HOSPITAL_BASED_OUTPATIENT_CLINIC_OR_DEPARTMENT_OTHER)
Admission: RE | Disposition: A | Discharge status: Home / Self Care | Payer: Self-pay | Source: Home / Self Care | Attending: Orthopedic Surgery

## 2021-10-01 ENCOUNTER — Encounter (HOSPITAL_BASED_OUTPATIENT_CLINIC_OR_DEPARTMENT_OTHER): Payer: Self-pay | Admitting: Orthopedic Surgery

## 2021-10-01 ENCOUNTER — Ambulatory Visit (HOSPITAL_BASED_OUTPATIENT_CLINIC_OR_DEPARTMENT_OTHER)
Admission: RE | Admit: 2021-10-01 | Discharge: 2021-10-01 | Disposition: A | Discharge status: Home / Self Care | Payer: Medicare Other | Attending: Orthopedic Surgery | Admitting: Orthopedic Surgery

## 2021-10-01 ENCOUNTER — Ambulatory Visit (HOSPITAL_BASED_OUTPATIENT_CLINIC_OR_DEPARTMENT_OTHER): Payer: Medicare Other | Admitting: Certified Registered"

## 2021-10-01 DIAGNOSIS — I10 Essential (primary) hypertension: Secondary | ICD-10-CM | POA: Diagnosis not present

## 2021-10-01 DIAGNOSIS — Z6841 Body Mass Index (BMI) 40.0 and over, adult: Secondary | ICD-10-CM | POA: Insufficient documentation

## 2021-10-01 DIAGNOSIS — E119 Type 2 diabetes mellitus without complications: Secondary | ICD-10-CM

## 2021-10-01 DIAGNOSIS — R2232 Localized swelling, mass and lump, left upper limb: Secondary | ICD-10-CM

## 2021-10-01 DIAGNOSIS — F32A Depression, unspecified: Secondary | ICD-10-CM | POA: Insufficient documentation

## 2021-10-01 DIAGNOSIS — E1136 Type 2 diabetes mellitus with diabetic cataract: Secondary | ICD-10-CM | POA: Insufficient documentation

## 2021-10-01 DIAGNOSIS — K219 Gastro-esophageal reflux disease without esophagitis: Secondary | ICD-10-CM | POA: Insufficient documentation

## 2021-10-01 DIAGNOSIS — F418 Other specified anxiety disorders: Secondary | ICD-10-CM

## 2021-10-01 DIAGNOSIS — G4730 Sleep apnea, unspecified: Secondary | ICD-10-CM | POA: Insufficient documentation

## 2021-10-01 DIAGNOSIS — F17200 Nicotine dependence, unspecified, uncomplicated: Secondary | ICD-10-CM | POA: Insufficient documentation

## 2021-10-01 DIAGNOSIS — F419 Anxiety disorder, unspecified: Secondary | ICD-10-CM | POA: Insufficient documentation

## 2021-10-01 DIAGNOSIS — M1990 Unspecified osteoarthritis, unspecified site: Secondary | ICD-10-CM | POA: Insufficient documentation

## 2021-10-01 DIAGNOSIS — E118 Type 2 diabetes mellitus with unspecified complications: Secondary | ICD-10-CM

## 2021-10-01 DIAGNOSIS — D2362 Other benign neoplasm of skin of left upper limb, including shoulder: Secondary | ICD-10-CM | POA: Diagnosis not present

## 2021-10-01 DIAGNOSIS — E669 Obesity, unspecified: Secondary | ICD-10-CM | POA: Insufficient documentation

## 2021-10-01 HISTORY — PX: MASS EXCISION: SHX2000

## 2021-10-01 LAB — GLUCOSE, CAPILLARY
Glucose-Capillary: 140 mg/dL — ABNORMAL HIGH (ref 70–99)
Glucose-Capillary: 108 mg/dL — ABNORMAL HIGH (ref 70–99)

## 2021-10-01 SURGERY — EXCISION MASS
Anesthesia: Monitor Anesthesia Care | Site: Hand | Laterality: Left

## 2021-10-01 MED ORDER — ONDANSETRON HCL 4 MG/2ML IJ SOLN
INTRAMUSCULAR | Status: DC | PRN
  Administered 2021-10-01: 4 mg via INTRAVENOUS

## 2021-10-01 MED ORDER — ONDANSETRON HCL 4 MG/2ML IJ SOLN: 4.0000 mg | Freq: Four times a day (QID) | INTRAMUSCULAR | Status: DC | PRN

## 2021-10-01 MED ORDER — FENTANYL CITRATE (PF) 100 MCG/2ML IJ SOLN: 25.0000 ug | INTRAMUSCULAR | Status: DC | PRN

## 2021-10-01 MED ORDER — OXYCODONE HCL 5 MG PO TABS: 5.0000 mg | ORAL_TABLET | Freq: Once | ORAL | Status: DC | PRN

## 2021-10-01 MED ORDER — CLINDAMYCIN PHOSPHATE 900 MG/50ML IV SOLN
900.0000 mg | INTRAVENOUS | Status: AC
  Administered 2021-10-01: 900 mg via INTRAVENOUS

## 2021-10-01 MED ORDER — MIDAZOLAM HCL 2 MG/2ML IJ SOLN
INTRAMUSCULAR | Status: AC
  Filled 2021-10-01: qty 2

## 2021-10-01 MED ORDER — BUPIVACAINE HCL (PF) 0.25 % IJ SOLN
INTRAMUSCULAR | Status: DC | PRN
  Administered 2021-10-01: 7 mL

## 2021-10-01 MED ORDER — PROPOFOL 500 MG/50ML IV EMUL
INTRAVENOUS | Status: DC | PRN
  Administered 2021-10-01: 200 ug/kg/min via INTRAVENOUS

## 2021-10-01 MED ORDER — 0.9 % SODIUM CHLORIDE (POUR BTL) OPTIME
TOPICAL | Status: DC | PRN
  Administered 2021-10-01: 120 mL

## 2021-10-01 MED ORDER — CEFAZOLIN SODIUM 1 G IJ SOLR
INTRAMUSCULAR | Status: AC
  Filled 2021-10-01: qty 20

## 2021-10-01 MED ORDER — FENTANYL CITRATE (PF) 100 MCG/2ML IJ SOLN
INTRAMUSCULAR | Status: DC | PRN
  Administered 2021-10-01: 50 ug via INTRAVENOUS

## 2021-10-01 MED ORDER — LACTATED RINGERS IV SOLN: INTRAVENOUS | Status: DC

## 2021-10-01 MED ORDER — CLINDAMYCIN PHOSPHATE 900 MG/50ML IV SOLN
INTRAVENOUS | Status: AC
  Filled 2021-10-01: qty 50

## 2021-10-01 MED ORDER — DIPHENHYDRAMINE HCL 25 MG PO CAPS
25.0000 mg | ORAL_CAPSULE | Freq: Once | ORAL | Status: AC
  Administered 2021-10-01: 25 mg via ORAL

## 2021-10-01 MED ORDER — OXYCODONE HCL 5 MG/5ML PO SOLN: 5.0000 mg | Freq: Once | ORAL | Status: DC | PRN

## 2021-10-01 MED ORDER — PROPOFOL 500 MG/50ML IV EMUL
INTRAVENOUS | Status: AC
  Filled 2021-10-01: qty 50

## 2021-10-01 MED ORDER — MIDAZOLAM HCL 5 MG/5ML IJ SOLN
INTRAMUSCULAR | Status: DC | PRN
  Administered 2021-10-01: 2 mg via INTRAVENOUS

## 2021-10-01 MED ORDER — FENTANYL CITRATE (PF) 100 MCG/2ML IJ SOLN
INTRAMUSCULAR | Status: AC
  Filled 2021-10-01: qty 2

## 2021-10-01 MED ORDER — TRAMADOL HCL 50 MG PO TABS
50.0000 mg | ORAL_TABLET | Freq: Four times a day (QID) | ORAL | 0 refills | Status: DC | PRN
Start: 2021-10-01 — End: 2022-01-20

## 2021-10-01 MED ORDER — DEXMEDETOMIDINE HCL IN NACL 80 MCG/20ML IV SOLN
INTRAVENOUS | Status: AC
  Filled 2021-10-01: qty 40

## 2021-10-01 MED ORDER — LIDOCAINE HCL (PF) 0.5 % IJ SOLN
INTRAMUSCULAR | Status: DC | PRN
  Administered 2021-10-01: 30 mL via INTRAVENOUS

## 2021-10-01 MED ORDER — DIPHENHYDRAMINE HCL 25 MG PO CAPS
ORAL_CAPSULE | ORAL | Status: AC
  Filled 2021-10-01: qty 1

## 2021-10-01 MED ORDER — LIDOCAINE HCL (CARDIAC) PF 100 MG/5ML IV SOSY
PREFILLED_SYRINGE | INTRAVENOUS | Status: DC | PRN
  Administered 2021-10-01: 40 mg via INTRAVENOUS

## 2021-10-01 SURGICAL SUPPLY — 48 items
APL PRP STRL LF DISP 70% ISPRP (MISCELLANEOUS) ×1
BLADE SURG 15 STRL LF DISP TIS (BLADE) ×1 IMPLANT
BLADE SURG 15 STRL SS (BLADE) ×2
BNDG CMPR 5X2 CHSV 1 LYR STRL (GAUZE/BANDAGES/DRESSINGS)
BNDG CMPR 5X3 CHSV STRCH STRL (GAUZE/BANDAGES/DRESSINGS) ×1
BNDG CMPR 9X4 STRL LF SNTH (GAUZE/BANDAGES/DRESSINGS)
BNDG COHESIVE 1X5 TAN STRL LF (GAUZE/BANDAGES/DRESSINGS) IMPLANT
BNDG COHESIVE 2X5 TAN ST LF (GAUZE/BANDAGES/DRESSINGS) IMPLANT
BNDG COHESIVE 3X5 TAN ST LF (GAUZE/BANDAGES/DRESSINGS) ×1 IMPLANT
BNDG ESMARK 4X9 LF (GAUZE/BANDAGES/DRESSINGS) IMPLANT
BNDG GAUZE ELAST 4 BULKY (GAUZE/BANDAGES/DRESSINGS) ×1 IMPLANT
CHLORAPREP W/TINT 26 (MISCELLANEOUS) ×2 IMPLANT
CORD BIPOLAR FORCEPS 12FT (ELECTRODE) ×2 IMPLANT
COVER BACK TABLE 60X90IN (DRAPES) ×2 IMPLANT
COVER MAYO STAND STRL (DRAPES) ×2 IMPLANT
CUFF TOURN SGL QUICK 18X4 (TOURNIQUET CUFF) IMPLANT
DRAIN PENROSE .5X12 LATEX STL (DRAIN) IMPLANT
DRAPE EXTREMITY T 121X128X90 (DISPOSABLE) ×2 IMPLANT
DRAPE SURG 17X23 STRL (DRAPES) ×2 IMPLANT
GAUZE SPONGE 4X4 12PLY STRL (GAUZE/BANDAGES/DRESSINGS) ×2 IMPLANT
GAUZE XEROFORM 1X8 LF (GAUZE/BANDAGES/DRESSINGS) ×2 IMPLANT
GLOVE SURG ORTHO LTX SZ8 (GLOVE) ×2 IMPLANT
GLOVE SURG POLYISO LF SZ6.5 (GLOVE) ×1 IMPLANT
GLOVE SURG UNDER POLY LF SZ6.5 (GLOVE) ×1 IMPLANT
GLOVE SURG UNDER POLY LF SZ7 (GLOVE) ×1 IMPLANT
GLOVE SURG UNDER POLY LF SZ8.5 (GLOVE) ×2 IMPLANT
GOWN STRL REUS W/ TWL LRG LVL3 (GOWN DISPOSABLE) ×1 IMPLANT
GOWN STRL REUS W/ TWL XL LVL3 (GOWN DISPOSABLE) IMPLANT
GOWN STRL REUS W/TWL LRG LVL3 (GOWN DISPOSABLE) ×2
GOWN STRL REUS W/TWL XL LVL3 (GOWN DISPOSABLE) ×4 IMPLANT
NDL HYPO 27GX1-1/4 (NEEDLE) ×1 IMPLANT
NDL SAFETY ECLIPSE 18X1.5 (NEEDLE) IMPLANT
NEEDLE HYPO 18GX1.5 SHARP (NEEDLE)
NEEDLE HYPO 27GX1-1/4 (NEEDLE) ×2 IMPLANT
NS IRRIG 1000ML POUR BTL (IV SOLUTION) ×2 IMPLANT
PACK BASIN DAY SURGERY FS (CUSTOM PROCEDURE TRAY) ×2 IMPLANT
PAD CAST 3X4 CTTN HI CHSV (CAST SUPPLIES) IMPLANT
PADDING CAST COTTON 3X4 STRL (CAST SUPPLIES) ×2
SPIKE FLUID TRANSFER (MISCELLANEOUS) IMPLANT
SPLINT PLASTER CAST XFAST 3X15 (CAST SUPPLIES) IMPLANT
SPLINT PLASTER XTRA FASTSET 3X (CAST SUPPLIES)
STOCKINETTE 4X48 STRL (DRAPES) ×2 IMPLANT
SUT ETHILON 4 0 PS 2 18 (SUTURE) ×2 IMPLANT
SUT VIC AB 4-0 P2 18 (SUTURE) IMPLANT
SYR BULB EAR ULCER 3OZ GRN STR (SYRINGE) ×2 IMPLANT
SYR CONTROL 10ML LL (SYRINGE) ×2 IMPLANT
TOWEL GREEN STERILE FF (TOWEL DISPOSABLE) ×2 IMPLANT
UNDERPAD 30X36 HEAVY ABSORB (UNDERPADS AND DIAPERS) ×2 IMPLANT

## 2021-10-01 NOTE — Transfer of Care (Signed)
Immediate Anesthesia Transfer of Care Note  Patient: Emma Casey  Procedure(s) Performed: EXCISION MASS LEFT PALM (Left: Hand)  Patient Location: PACU  Anesthesia Type:MAC and Bier block  Level of Consciousness: awake  Airway & Oxygen Therapy: Patient Spontanous Breathing and Patient connected to face mask oxygen  Post-op Assessment: Report given to RN and Post -op Vital signs reviewed and stable  Post vital signs: Reviewed and stable  Last Vitals:  Vitals Value Taken Time  BP 125/69 10/01/21 1415  Temp    Pulse 71 10/01/21 1416  Resp 17 10/01/21 1416  SpO2 96 % 10/01/21 1416  Vitals shown include unvalidated device data.  Last Pain:  Vitals:   10/01/21 1058  TempSrc: Oral  PainSc: 4       Patients Stated Pain Goal: 3 (76/22/63 3354)  Complications: No notable events documented.

## 2021-10-01 NOTE — Brief Op Note (Signed)
10/01/2021  2:12 PM  PATIENT:  Emma Casey  61 y.o. female  PRE-OPERATIVE DIAGNOSIS:  MASS LEFT PALM  POST-OPERATIVE DIAGNOSIS:  MASS LEFT PALM  PROCEDURE:  Procedure(s): EXCISION MASS LEFT PALM (Left)  SURGEON:  Surgeon(s) and Role:    Daryll Brod, MD - Primary  PHYSICIAN ASSISTANT:   ASSISTANTS: none   ANESTHESIA:   local, regional, and IV sedation  EBL:  1 mL   BLOOD ADMINISTERED:none  DRAINS: none   LOCAL MEDICATIONS USED:  BUPIVICAINE   SPECIMEN:  Excision  DISPOSITION OF SPECIMEN:  PATHOLOGY  COUNTS:  YES  TOURNIQUET:   Total Tourniquet Time Documented: Forearm (Left) - 18 minutes Total: Forearm (Left) - 18 minutes   DICTATION: .Viviann Spare Dictation  PLAN OF CARE: Discharge to home after PACU  PATIENT DISPOSITION:  PACU - hemodynamically stable.

## 2021-10-01 NOTE — H&P (Signed)
Emma Casey is an 61 y.o. female.   Chief Complaint: Mass left palm HPI: Dosha is a 61 year old right-hand-dominant female referred by Dr. Lynnell Catalan she states she has a painful mass in the palm of her left hand which has been present for approximately a year. She does not recall exactly when. She does not have these on her feet she has no history of injury. Pain occurs if she hits the area is mild in nature. He has a history of diabetes arthritis no history of thyroid problems or gout. Raquel Sarna history is positive for arthritis.Marland Kitchen She was referred for ultrasound see if this was a nodule in the palmar fascia. He has had the ultrasound done read out by Dr. Oleta Mouse. Today soft tissue mass superficial to the flexor tendons solid in nature and avascular  Past Medical History:  Diagnosis Date   Anxiety    Arthritis    right hand   Back pain    Breast cancer ([place])    Bronchitis    Cancer ([place]) 11/2018   left breast ca   Chronic fatigue syndrome    Constipation    Depression    Diabetes mellitus without complication (HCC)    Family history of breast cancer    GERD (gastroesophageal reflux disease)    Headache    Heart murmur    Herpes 1&2    Hyperlipidemia    Hypertension    Joint pain    Leg fracture, left    as a child   Lymphedema    Multiple food allergies    OSA on CPAP    has CPAP but has not used in over a month   Personal history of chemotherapy    Personal history of radiation therapy    Prediabetes    Shortness of breath     Past Surgical History:  Procedure Laterality Date   ABDOMINAL HYSTERECTOMY     BREAST LUMPECTOMY WITH RADIOACTIVE SEED AND SENTINEL LYMPH NODE BIOPSY Left 01/08/2019   Procedure: LEFT BREAST LUMPECTOMY WITH RADIOACTIVE SEED AND LEFT AXILLARY DEEP SENTINEL LYMPH NODE BIOPSY WITH BLUE DYE INJECTION;  Surgeon: Fanny Skates, MD;  Location: [facility];  Service: General;  Laterality: Left;   COLONOSCOPY N/A 09/02/2015   Procedure:  COLONOSCOPY;  Surgeon: Danie Binder, MD;  [facility];  Service: Endoscopy;  Laterality: N/A;  11:15 Am   EYE SURGERY Right    cataract   HAND SURGERY Right    broken bone   PORT-A-CATH REMOVAL Right 08/21/2019   Procedure: REMOVAL PORT-A-CATH;  Surgeon: Fanny Skates, MD;  Location: [facility];  Service: General;  Laterality: Right;   PORTACATH PLACEMENT N/A 03/01/2019   Procedure: INSERTION PORT-A-CATH;  Surgeon: Fanny Skates, MD;  Location: [facility];  Service: General;  Laterality: N/A;   RE-EXCISION OF BREAST LUMPECTOMY Left 03/01/2019   Procedure: RE-EXCISION OF LEFT BREAST LUMPECTOMY MULITPLE MARGINS;  Surgeon: Fanny Skates, MD;  Location: [facility];  Service: General;  Laterality: Left;   TUBAL LIGATION      Family History  Problem Relation Age of Onset   Stroke Mother    Transient ischemic attack Father    Heart attack Father    Hypertension Father    Hyperlipidemia Father    Heart disease Father    Sleep apnea Father    Stroke Brother    Breast cancer Maternal Aunt 73   Breast cancer Cousin        mat first cousin  Breast cancer Cousin        mat first cousin   Cancer Cousin        mat first cousin   Social History:  reports that she has been smoking cigarettes. She has a 2.50 pack-year smoking history. She has never used smokeless tobacco. She reports that she does not drink alcohol and does not use drugs.  Allergies:  Allergies  Allergen Reactions   Asa [Aspirin] Hives   Nsaids Hives   Penicillins     Tolerated Ancef 01/08/2019 Has patient had a PCN reaction causing immediate rash, facial/tongue/throat swelling, SOB or lightheadedness with hypotension: Yesyes Has patient had a PCN reaction causing severe rash involving mucus membranes or skin necrosis: Nono Has patient had a PCN reaction that required hospitalization Nono Has patient had a PCN reaction occurring within the last 10 years: Nono If all of the above answers are "NO",  then may proceed with Cephalosporin    No medications prior to admission.    No results found for this or any previous visit (from the past 48 hour(s)).  No results found.   Pertinent items are noted in HPI.  Height 5\' 5"  (1.651 m), weight 113.4 kg.  General appearance: alert, cooperative, and appears stated age Head: Normocephalic, without obvious abnormality Neck: no JVD Resp: clear to auscultation bilaterally Cardio: regular rate and rhythm, S1, S2 normal, no murmur, click, rub or gallop GI: soft, non-tender; bowel sounds normal; no masses,  no organomegaly Extremities:  Negative for Pulses: 2+ and symmetric Skin: Skin color, texture, turgor normal. No rashes or lesions Neurologic: Grossly normal Incision/Wound: na  Assessment/Plan Diagnosis: mass left palm Plan: She would like to have this excised. Prepare postoperative course are discussed along with risk and complications. She is aware there is no guarantee to the surgery possibility of infection recurrence injury to arteries nerves tendons possibility that this could be a Dupuytren's nodule which may exacerbate the possibility of a flexion deformity to her finger. The schedules an outpatient under regional anesthesia for excision mass left palm under regional anesthesia.   Daryll Brod 10/01/2021, 5:06 AM

## 2021-10-01 NOTE — Anesthesia Procedure Notes (Signed)
Anesthesia Regional Block: Bier block (IV Regional)   Pre-Anesthetic Checklist: , timeout performed,  Correct Patient, Correct Site, Correct Laterality,  Correct Procedure, Correct Position, site marked,  Risks and benefits discussed,  Surgical consent,  Pre-op evaluation,  At surgeon's request and post-op pain management  Laterality: Left  Prep: alcohol swabs        Procedures:,,,,, intact distal pulses, Esmarch exsanguination,  Single tourniquet utilized    Narrative:  Start time: 10/01/2021 1:50 PM End time: 10/01/2021 1:51 PM

## 2021-10-01 NOTE — Op Note (Signed)
NAME: ADIE VILAR MEDICAL RECORD NO: [id number] DATE OF BIRTH: 19-Sep-1960 FACILITY: [facility] LOCATION: [facility] PHYSICIAN: Wynonia Sours, MD   OPERATIVE REPORT   DATE OF PROCEDURE: 10/01/21    PREOPERATIVE DIAGNOSIS: Mass left palm   POSTOPERATIVE DIAGNOSIS: Same   PROCEDURE: Excision mass left palm   SURGEON: Daryll Brod, M.D.   ASSISTANT: none   ANESTHESIA:  Bier block with sedation and Local   INTRAVENOUS FLUIDS:  Per anesthesia flow sheet.   ESTIMATED BLOOD LOSS:  Minimal.   COMPLICATIONS:  None.   SPECIMENS: Mass 16.0 x by 7 mm   TOURNIQUET TIME:    Total Tourniquet Time Documented: Forearm (Left) - 18 minutes Total: Forearm (Left) - 18 minutes    DISPOSITION:  Stable to PACU.   INDICATIONS: Patient is a 61 year old female with a history of a mass in the palm of the left hand.  This has been painful for her every time she grips or pinches and is desires having it removed.  Ultrasound reveals a mass 16 x 7 mm in size which appears to be in the palmar fascia is aware that this may be a Dupuytren's nodule but is desirous of having it removed despite warnings that it may increase chance of fibromatosis.  The operative area the patient is seen extremity marked by both patient and surgeon antibiotic  OPERATIVE COURSE: Patient brought the operating room placed in the supine position with the left arm free.  Forearm IV regional anesthetic was carried out without difficulty under the direction the anesthesia department.  Prep was done with ChloraPrep.  A 3-minute dry time allowed timeout taken confirm patient procedure.  A oblique incision was made over the mass carried down through subcutaneous tissue.  Bleeders were electrocauterized necessary with bipolar.  The mass was immediately encountered in the palmar fascia.  The palmar fascia was then isolated with care to protect the neurovascular structures.  This was then excised proximally and distally with the  palmar fascia to the middle finger.  The specimen was sent to pathology.  The wound was copiously irrigated with saline.  Neurovascular bundles were each identified found to be intact over the entire course of the incision.  The wound was irrigated and closed with interrupted 4-0 nylon sutures.  Local infiltration with quarter percent bupivacaine without epinephrine was given 7 cc was used.  A sterile compressive dressing with fingers 3 was applied.  Deflation of the tourniquet all fingers immediately pink.  She was taken to the recovery room for observation in satisfactory condition.  She will be discharged home to return to the hand center of [facility] in 1 week on Tylenol for pain with Ultram for breakthrough.   Daryll Brod, MD Electronically signed, 10/01/21

## 2021-10-01 NOTE — Discharge Instructions (Addendum)

## 2021-10-01 NOTE — Anesthesia Preprocedure Evaluation (Signed)
Anesthesia Evaluation  Patient identified by MRN, date of birth, ID band Patient awake    Reviewed: Allergy & Precautions, H&P , NPO status , Patient's Chart, lab work & pertinent test results  Airway Mallampati: II   Neck ROM: full    Dental   Pulmonary shortness of breath, sleep apnea , Current Smoker and Patient abstained from smoking.,    breath sounds clear to auscultation       Cardiovascular hypertension,  Rhythm:regular Rate:Normal     Neuro/Psych  Headaches, PSYCHIATRIC DISORDERS Anxiety Depression    GI/Hepatic GERD  ,  Endo/Other  diabetes, Type 2Morbid obesity  Renal/GU      Musculoskeletal  (+) Arthritis ,   Abdominal   Peds  Hematology   Anesthesia Other Findings   Reproductive/Obstetrics                             Anesthesia Physical Anesthesia Plan  ASA: 3  Anesthesia Plan: Bier Block and MAC and Bier Block-LIDOCAINE ONLY   Post-op Pain Management:    Induction: Intravenous  PONV Risk Score and Plan: 1 and Propofol infusion and Treatment may vary due to age or medical condition  Airway Management Planned: Simple Face Mask  Additional Equipment:   Intra-op Plan:   Post-operative Plan:   Informed Consent: I have reviewed the patients History and Physical, chart, labs and discussed the procedure including the risks, benefits and alternatives for the proposed anesthesia with the patient or authorized representative who has indicated his/her understanding and acceptance.     Dental advisory given  Plan Discussed with: CRNA, Anesthesiologist and Surgeon  Anesthesia Plan Comments:         Anesthesia Quick Evaluation

## 2021-10-02 NOTE — Anesthesia Postprocedure Evaluation (Signed)
Anesthesia Post Note  Patient: [name]  Procedure(s) Performed: EXCISION MASS LEFT PALM (Left: Hand)     Patient location during evaluation: PACU Anesthesia Type: MAC and Bier Block Level of consciousness: awake and alert Pain management: pain level controlled Vital Signs Assessment: post-procedure vital signs reviewed and stable Respiratory status: spontaneous breathing, nonlabored ventilation, respiratory function stable and patient connected to nasal cannula oxygen Cardiovascular status: stable and blood pressure returned to baseline Postop Assessment: no apparent nausea or vomiting Anesthetic complications: no   No notable events documented.  Last Vitals:  Vitals:   10/01/21 1430 10/01/21 1448  BP: 121/75 (!) 151/81  Pulse: 74 85  Resp: 14 18  Temp:  36.8 C  SpO2: 96% 96%    Last Pain:  Vitals:   10/01/21 1448  TempSrc:   PainSc: 0-No pain                 Geraldina Parrott S

## 2021-10-04 ENCOUNTER — Encounter (HOSPITAL_BASED_OUTPATIENT_CLINIC_OR_DEPARTMENT_OTHER): Payer: Self-pay | Admitting: Orthopedic Surgery

## 2021-10-04 LAB — SURGICAL PATHOLOGY

## 2021-12-24 ENCOUNTER — Encounter: Payer: Self-pay | Admitting: Hematology

## 2021-12-27 ENCOUNTER — Other Ambulatory Visit: Payer: Self-pay | Admitting: Hematology

## 2021-12-27 DIAGNOSIS — Z853 Personal history of malignant neoplasm of breast: Secondary | ICD-10-CM

## 2021-12-28 ENCOUNTER — Other Ambulatory Visit: Payer: Self-pay

## 2021-12-29 ENCOUNTER — Other Ambulatory Visit: Payer: Self-pay

## 2022-01-16 NOTE — Progress Notes (Unsigned)
[facility]   [phone] [phone]) [phone]   Clinic Follow up Note   Patient Care Team: Leeanne Rio, MD as PCP - General (Family Medicine) Mauro Kaufmann, RN as Oncology Nurse Navigator Rockwell Germany, RN as Oncology Nurse Navigator Fanny Skates, MD as Consulting Physician (General Surgery) Truitt Merle, MD as Consulting Physician (Hematology) Kyung Rudd, MD as Consulting Physician (Radiation Oncology) Alla Feeling, NP as Nurse Practitioner (Nurse Practitioner) 01/20/2022  CHIEF COMPLAINT: Follow up left breast cancer   SUMMARY OF ONCOLOGIC HISTORY: Oncology History Overview Note  Cancer Staging Malignant neoplasm of upper-inner quadrant of left breast in female, estrogen receptor positive ([place]) Staging form: Breast, AJCC 8th Edition - Clinical stage from 12/17/2018: Stage Unknown (cTX, cN0, cM0, G2, ER+, PR+, HER2-) - Signed by Truitt Merle, MD on 12/25/2018 - Pathologic stage from 01/08/2019: Stage IA (pT1c, pN0, cM0, G3, ER+, PR+, HER2-) - Signed by Truitt Merle, MD on 01/22/2019     Malignant neoplasm of upper-inner quadrant of left breast in female, estrogen receptor positive ([place])  11/28/2018 Mammogram   Diagnostic Mammogram 11/28/18 IMPRESSION:  Suspcious mass in upper inner quadrant of the left breast  at posterior depth.     12/17/2018 Initial Biopsy   Diagnosis 12/17/18 Breast, left, needle core biopsy, upper inner quadrant - INVASIVE DUCTAL CARCINOMA, SEE COMMENT. - DUCTAL CARCINOMA IN SITU.    12/17/2018 Cancer Staging   Staging form: Breast, AJCC 8th Edition - Clinical stage from 12/17/2018: Stage Unknown (cTX, cN0, cM0, G2, ER+, PR+, HER2-) - Signed by Truitt Merle, MD on 12/25/2018    12/20/2018 Initial Diagnosis   Malignant neoplasm of upper-inner quadrant of left breast in female, estrogen receptor positive ([place])    12/20/2018 Receptors her2   Results: IMMUNOHISTOCHEMICAL AND MORPHOMETRIC ANALYSIS PERFORMED MANUALLY The tumor cells  are NEGATIVE for Her2 (1+). Estrogen Receptor: 95%, POSITIVE, STRONG STAINING INTENSITY Progesterone Receptor: 30%, POSITIVE, STRONG STAINING INTENSITY Proliferation Marker Ki67: 20%    01/08/2019 Surgery   LEFT BREAST LUMPECTOMY WITH RADIOACTIVE SEED AND LEFT AXILLARY DEEP SENTINEL LYMPH NODE BIOPSY WITH BLUE DYE INJECTION by Dr [name] 01/08/19     01/08/2019 Pathology Results   Diagnosis 01/08/19 1. Breast, lumpectomy, left - INVASIVE DUCTAL CARCINOMA, GRADE III/III, SPANNING 1.8 CM. - DUCTAL CARCINOMA IN SITU, HIGH GRADE. - DUCTAL CARCINOMA IN SITU IS FOCALLY PRESENT AT THE ANTERIOR MARGIN OF SPECIMEN #1. - DUCTAL CARCINOMA IN SITU IS FOCALLY LESS THAN 0.1 CM FROM THE INFERIOR MARGIN OF SPECIMEN # 1. - SEE ONCOLOGY TABLE BELOW. 2. Breast, excision, left medial margin - DUCTAL CARCINOMA IN SITU, FOCALLY PRESENT AT THE MEDIAL MARGIN OF SPECIMEN # 2. 3. Breast, excision, left posterior margin - BENIGN BREAST PARENCHYMA. - THERE IS NO EVIDENCE OF MALIGNANCY. - SEE COMMENT. 4. Lymph node, sentinel, biopsy, left axillary - THERE IS NO EVIDENCE OF CARCINOMA IN 1 OF 1 LYMPH NODE (0/1). 5. Lymph node, sentinel, biopsy, left axillary - THERE IS NO EVIDENCE OF CARCINOMA IN 1 OF 1 LYMPH NODE (0/1). 6. Lymph node, sentinel, biopsy, left axillary - THERE IS NO EVIDENCE OF CARCINOMA IN 1 OF 1 LYMPH NODE (0/1)    01/08/2019 Oncotype testing   recurrence score of 27 with risk of recurrence 16% with Tamoxifen or AI alone. There is a benefit of chemotherapy >15%    01/08/2019 Cancer Staging   Staging form: Breast, AJCC 8th Edition - Pathologic stage from 01/08/2019: Stage IA (pT1c, pN0, cM0, G3, ER+, PR+, HER2-) - Signed by  Truitt Merle, MD on 01/22/2019    01/29/2019 Genetic Testing   Negative genetic testing on the common hereditary cancer panel.  The Common Hereditary Gene Panel offered by [facility] includes sequencing and/or deletion duplication testing of the following 48 genes: APC, ATM, AXIN2,  BARD1, BMPR1A, BRCA1, BRCA2, BRIP1, CDH1, CDK4, CDKN2A (p14ARF), CDKN2A (p16INK4a), CHEK2, CTNNA1, DICER1, EPCAM (Deletion/duplication testing only), GREM1 (promoter region deletion/duplication testing only), KIT, MEN1, MLH1, MSH2, MSH3, MSH6, MUTYH, NBN, NF1, NHTL1, PALB2, PDGFRA, PMS2, POLD1, POLE, PTEN, RAD50, RAD51C, RAD51D, RNF43, SDHB, SDHC, SDHD, SMAD4, SMARCA4. STK11, TP53, TSC1, TSC2, and VHL.  The following genes were evaluated for sequence changes only: SDHA and HOXB13 c.251G>A variant only. The report date is January 29, 2019.    03/01/2019 Surgery   RE-EXCISION OF LEFT BREAST LUMPECTOMY MULITPLE MARGINS by Dr. [name]  Diagnosis 1. Breast, excision, re-excision left anterior margin - BENIGN BREAST PARENCHYMA WITH PREVIOUS PROCEDURE-RELATED CHANGES - NEGATIVE FOR IN SITU OR INVASIVE CARCINOMA 2. 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 SEE NOTE - DCIS IS FOCALLY LESS THAN 1 MM FROM NEW INFERIOR MARGIN - NEGATIVE FOR INVASIVE CARCINOMA - PREVIOUS PROCEDURE-RELATED CHANGES 3. Breast, excision, re-excision left medial margin - FOCAL DUCTAL CARCINOMA IN SITU (DCIS), 0.2 CM. SEE NOTE - DCIS IS 1.5 MM FROM NEW MEDIAL MARGIN - NEGATIVE FOR INVASIVE CARCINOMA - PREVIOUS PROCEDURE-RELATED CHANGES 4. Breast, excision, re-excision left final medial argin - BENIGN BREAST PARENCHYMA WITH MILD FOCAL PREVIOUS PROCEDURE-RELATED CHANGES - NEGATIVE FOR IN SITU OR INVASIVE CARCINOMA    03/22/2019 - 05/24/2019 Chemotherapy   TC q3weeks for 4 cycles starting 03/22/19 and completed on 05/24/19    06/24/2019 - 08/09/2019 Radiation Therapy   Radiation therapy with Dr. Lisbeth Renshaw 06/24/19-08/09/19   08/2019 -  Anti-estrogen oral therapy   Letrozole 2.55m starting 08/2019   10/23/2019 Survivorship   SCP delivered by LCira Rue NP      CURRENT THERAPY: Letrozole 2.5 mg starting 08/2019  INTERVAL HISTORY: Ms. MInouereturns for follow up as  scheduled. Last seen by Dr. FBurr Medico12/2/22. Mammogram scheduled on 03/16/22.  She continues to have intermittent left breast pain that is sharp but can be severe, she stopped wearing bras and this has helped some.  She plans to discuss breast reduction at her next visit with Dr. BBarry Dienesin July.  Denies new lump/mass, nipple discharge or inversion, or skin change.  She had a cyst removed from her left palm.  Diabetic neuropathy is getting worse, takes gabapentin 3 times a day at times.  Her chemo related nausea never resolved, and is worse at night with allergies and postnasal drip.  She ran out of Zofran and Compazine and has been having more coughing spells with vomiting.  She is requesting a refill.  She tolerates letrozole without significant side effects.  Denies hot flashes, mood change, worsening/new bone or joint pain, or any other new complaints.  She continues to cut back smoking.  All other systems were reviewed with the patient and are negative.  MEDICAL HISTORY:  Past Medical History:  Diagnosis Date   Anxiety    Arthritis    right hand   Back pain    Breast cancer ([place]    Bronchitis    Cancer ([facility] 11/2018   left breast ca   Chronic fatigue syndrome    Constipation    Depression    Diabetes mellitus without complication (HCC)    Family history of breast cancer  GERD (gastroesophageal reflux disease)    Headache    Heart murmur    Herpes 1&2    Hyperlipidemia    Hypertension    Joint pain    Leg fracture, left    as a child   Lymphedema    Multiple food allergies    OSA on CPAP    has CPAP but has not used in over a month   Personal history of chemotherapy    Personal history of radiation therapy    Prediabetes    Shortness of breath     SURGICAL HISTORY: Past Surgical History:  Procedure Laterality Date   ABDOMINAL HYSTERECTOMY     BREAST LUMPECTOMY WITH RADIOACTIVE SEED AND SENTINEL LYMPH NODE BIOPSY Left 01/08/2019   Procedure: LEFT BREAST LUMPECTOMY WITH  RADIOACTIVE SEED AND LEFT AXILLARY DEEP SENTINEL LYMPH NODE BIOPSY WITH BLUE DYE INJECTION;  Surgeon: Fanny Skates, MD;  Location: [facility];  Service: General;  Laterality: Left;   COLONOSCOPY N/A 09/02/2015   Procedure: COLONOSCOPY;  Surgeon: Danie Binder, MD;  [facility];  Service: Endoscopy;  Laterality: N/A;  11:15 Am   EYE SURGERY Right    cataract   HAND SURGERY Right    broken bone   MASS EXCISION Left 10/01/2021   Procedure: EXCISION MASS LEFT PALM;  Surgeon: Daryll Brod, MD;  Location: [place];  Service: Orthopedics;  Laterality: Left;   PORT-A-CATH REMOVAL Right 08/21/2019   Procedure: REMOVAL PORT-A-CATH;  Surgeon: Fanny Skates, MD;  Location: [facility];  Service: General;  Laterality: Right;   PORTACATH PLACEMENT N/A 03/01/2019   Procedure: INSERTION PORT-A-CATH;  Surgeon: Fanny Skates, MD;  Location: [place];  Service: General;  Laterality: N/A;   RE-EXCISION OF BREAST LUMPECTOMY Left 03/01/2019   Procedure: RE-EXCISION OF LEFT BREAST LUMPECTOMY MULITPLE MARGINS;  Surgeon: Fanny Skates, MD;  Location: [place];  Service: General;  Laterality: Left;   TUBAL LIGATION      I have reviewed the social history and family history with the patient and they are unchanged from previous note.  ALLERGIES:  is allergic to asa [aspirin], nsaids, and penicillins.  MEDICATIONS:  Current Outpatient Medications  Medication Sig Dispense Refill   acyclovir (ZOVIRAX) 400 MG tablet Take 400 mg by mouth 2 (two) times daily.     amLODipine (NORVASC) 5 MG tablet Take 5 mg by mouth daily.     Ascorbic Acid (VITAMIN C) 1000 MG tablet Take 1,000 mg by mouth daily.     atorvastatin (LIPITOR) 20 MG tablet Take 20 mg by mouth daily.     Cholecalciferol (VITAMIN D) 50 MCG (2000 UT) CAPS Take 2,000 Units by mouth daily.     diphenhydrAMINE (BENADRYL) 25 MG tablet Take 25 mg by mouth every 6 (six) hours as needed.     Dulaglutide  (TRULICITY) 0.25 EN/2.7PO SOPN Inject 0.5 mLs (0.75 mg total) into the skin once a week. (Patient not taking: Reported on 05/13/2020) 2 mL 0   fluticasone (FLONASE) 50 MCG/ACT nasal spray Place 2 sprays into both nostrils daily.      folic acid (FOLVITE) 242 MCG tablet Take 400 mcg by mouth daily.      gabapentin (NEURONTIN) 300 MG capsule TAKE 1 CAPSULE BY MOUTH  TWICE DAILY 180 capsule 3   lactulose (CHRONULAC) 10 GM/15ML solution Take 15 mLs (10 g total) by mouth 2 (two) times daily. 236 mL 0   letrozole (FEMARA) 2.5 MG tablet TAKE 1 TABLET BY  MOUTH  DAILY 90 tablet 3   lisinopril (ZESTRIL) 2.5 MG tablet Take 2.5 mg by mouth at bedtime.     loratadine (CLARITIN) 10 MG tablet Take 10 mg by mouth daily.     metFORMIN (GLUCOPHAGE-XR) 500 MG 24 hr tablet      montelukast (SINGULAIR) 10 MG tablet Take 10 mg by mouth at bedtime.     Multiple Vitamin (MULTIVITAMIN) capsule Take 1 capsule by mouth daily.     omeprazole (PRILOSEC) 20 MG capsule Take 20 mg by mouth daily.     ondansetron (ZOFRAN) 8 MG tablet Take 1 tablet (8 mg total) by mouth 2 (two) times daily as needed for refractory nausea / vomiting. Start on day 3 after chemo. 30 tablet 1   PARoxetine (PAXIL) 10 MG tablet Take 10 mg by mouth at bedtime.     potassium chloride (MICRO-K) 10 MEQ CR capsule Take 10 mEq by mouth 2 (two) times daily.     prochlorperazine (COMPAZINE) 10 MG tablet Take 1 tablet (10 mg total) by mouth every 6 (six) hours as needed (Nausea or vomiting). 30 tablet 1   tiZANidine (ZANAFLEX) 4 MG tablet Take 4 mg by mouth 3 (three) times daily.     topiramate (TOPAMAX) 100 MG tablet Take 100 mg by mouth daily.     traMADol (ULTRAM) 50 MG tablet Take 1 tablet (50 mg total) by mouth every 6 (six) hours as needed. 20 tablet 0   vitamin B-12 (CYANOCOBALAMIN) 500 MCG tablet Take 500 mcg by mouth daily.      Vitamin D, Ergocalciferol, (DRISDOL) 1.25 MG (50000 UNIT) CAPS capsule Take 1 capsule (50,000 Units total) by mouth every 7  (seven) days. (Patient not taking: Reported on 05/13/2020) 4 capsule 0   No current facility-administered medications for this visit.    PHYSICAL EXAMINATION: ECOG PERFORMANCE STATUS: 1 - Symptomatic but completely ambulatory  Vitals:   01/20/22 0913  BP: (!) 151/88  Pulse: 74  Resp: 20  Temp: (!) 97.5 F (36.4 C)  SpO2: 100%   Filed Weights   01/20/22 0913  Weight: 254 lb 3.2 oz (115.3 kg)    GENERAL:alert, no distress and comfortable SKIN: No rash EYES: sclera clear NECK: Without mass LYMPH:  no palpable cervical or supraclavicular lymphadenopathy  LUNGS: clear with normal breathing effort HEART: regular rate & rhythm, no lower extremity edema ABDOMEN:abdomen soft, non-tender and normal bowel sounds NEURO: alert & oriented x 3 with fluent speech Breast exam: Pendulous breasts are symmetrical without nipple discharge or inversion.  S/p left lumpectomy, incisions completely healed with minimal scar tissue.  No palpable mass or nodularity in either breast or axilla that I could appreciate.  LABORATORY DATA:  I have reviewed the data as listed    Latest Ref Rng & Units 01/20/2022    9:08 AM 07/23/2021    2:30 PM 01/21/2021   12:17 PM  CBC  WBC 4.0 - 10.5 K/uL 7.7   8.9   7.1    Hemoglobin 12.0 - 15.0 g/dL 12.7   13.2   12.5    Hematocrit 36.0 - 46.0 % 41.4   43.1   40.7    Platelets 150 - 400 K/uL 257   275   289          Latest Ref Rng & Units 01/20/2022    9:08 AM 09/28/2021    9:55 AM 07/23/2021    2:30 PM  CMP  Glucose 70 - 99 mg/dL 228   121  121    BUN 8 - 23 mg/dL 11   <5   7    Creatinine 0.44 - 1.00 mg/dL 1.26   1.26   1.20    Sodium 135 - 145 mmol/L 142   141   140    Potassium 3.5 - 5.1 mmol/L 4.4   4.3   3.7    Chloride 98 - 111 mmol/L 108   105   106    CO2 22 - 32 mmol/L 29   28   25     Calcium 8.9 - 10.3 mg/dL 9.7   9.4   9.1    Total Protein 6.5 - 8.1 g/dL 6.7    7.1    Total Bilirubin 0.3 - 1.2 mg/dL 0.6    0.5    Alkaline Phos 38 - 126 U/L 71     87    AST 15 - 41 U/L 11    8    ALT 0 - 44 U/L 7    7        RADIOGRAPHIC STUDIES: I have personally reviewed the radiological images as listed and agreed with the findings in the report. No results found.   ASSESSMENT & PLAN: Emma Casey is a 61 y.o. female with    1. Malignant neoplasm of upper-inner quadrant of left breast, pT1cN0, stage I, ER/PR+,  HER2-, Grade III, Oncotype RS 27 -Diagnosed in 11/2018. S/p left breast lumpectomy and SLN biopsy on 01/08/19. Given positive margins she underwent left re-excision on 03/01/19. Oncotype showed RS 27, high risk.  -she completed 4 cycles of adjuvant chemotherapy with TC in 05/2019 and adjuvant Radiation.  -she started antiestrogen therapy with letrozole in 08/2019. she is tolerating well  -most recent MM on 03/11/21 was benign. -Ms. Seefeld is clinically doing well.  Tolerating letrozole without significant side effects.  Breast exam is benign, labs are stable.  Overall there is no clinical concern for breast cancer recurrence. -Continue surveillance and letrozole.  Next mammo 03/16/2022 -She will see Dr. Barry Dienes in July -We will see her back in 6 months for next routine visit, or sooner if needed   2. Osteopenia -DEXA 12/02/2019 showed lowest T score -2.1, FRAX showed 11.1% risk of major osteoporotic fracture and 1.1% risk of hip fracture in the next 10 years  -DEXA on 04/13/21 showed improvement in osteopenia (T-score -1.9). -she is taking daily calcium and vit D -Difficulty with weightbearing exercise due to knee pain   3. HTN, DM with hyperglycemia, Obesity -On Amlodipine, lisinopril, metformin 3 tabs once daily.   -HTN controlled and poorly controlled hyperglycemia.  -Dr. Burr Medico previously referred her to [facility] weight [facility] -She desires weight loss, I reviewed a healthy nutritious diet, hydrate, limit alcohol, quit smoking, and engage in exercise.   -Due to her knee pain I recommend aqua aerobics, [facility]    4. CKD stage III -secondary to DM and HTN. Will monitor  -Overall stable   5. Smoking Cessation  -She smoked for 10 years before she quit. She restarted after recent cancer diagnosis  -She continues to try to quit smoking  6. Genetic Testing was negative for pathogenetic mutations   7. B/l leg pain, neuropathy of feet, G1 -Likely secondary to chemo and DM -Overall improved some, takes gabapentin 3 times daily, I discussed she can increase to take 2 tabs at night if needed  Plan: -Labs reviewed -Continue letrozole and breast cancer surveillance -Mammogram  03/16/2022.   -Follow-up with Dr. Barry Dienes in July as scheduled, patient wants to discuss breast reduction -Breast cancer surveillance visit in 6 months, or sooner if needed -can increase gabapentin to 300 mg AM, 300 mg PM, and 600 mg HS -Refilled zofran, compazine    All questions were answered. The patient knows to call the clinic with any problems, questions or concerns. No barriers to learning was detected. I spent 20 minutes counseling the patient face to face. The total time spent in the appointment was 30 minutes and more than 50% was on counseling and review of test results     Alla Feeling, NP 01/20/22

## 2022-01-20 ENCOUNTER — Inpatient Hospital Stay: Payer: Medicare Other

## 2022-01-20 ENCOUNTER — Inpatient Hospital Stay: Payer: Medicare Other | Attending: Nurse Practitioner | Admitting: Nurse Practitioner

## 2022-01-20 ENCOUNTER — Encounter: Payer: Self-pay | Admitting: Nurse Practitioner

## 2022-01-20 ENCOUNTER — Other Ambulatory Visit: Payer: Self-pay

## 2022-01-20 VITALS — BP 151/88 | HR 74 | Temp 97.5°F | Resp 20 | Wt 254.2 lb

## 2022-01-20 DIAGNOSIS — I10 Essential (primary) hypertension: Secondary | ICD-10-CM | POA: Insufficient documentation

## 2022-01-20 DIAGNOSIS — M79604 Pain in right leg: Secondary | ICD-10-CM | POA: Diagnosis not present

## 2022-01-20 DIAGNOSIS — M79605 Pain in left leg: Secondary | ICD-10-CM | POA: Insufficient documentation

## 2022-01-20 DIAGNOSIS — F17210 Nicotine dependence, cigarettes, uncomplicated: Secondary | ICD-10-CM | POA: Diagnosis not present

## 2022-01-20 DIAGNOSIS — M8580 Other specified disorders of bone density and structure, unspecified site: Secondary | ICD-10-CM | POA: Insufficient documentation

## 2022-01-20 DIAGNOSIS — C50212 Malignant neoplasm of upper-inner quadrant of left female breast: Secondary | ICD-10-CM

## 2022-01-20 DIAGNOSIS — Z170 Estrogen receptor positive status [ER+]: Secondary | ICD-10-CM

## 2022-01-20 DIAGNOSIS — E1140 Type 2 diabetes mellitus with diabetic neuropathy, unspecified: Secondary | ICD-10-CM | POA: Diagnosis not present

## 2022-01-20 DIAGNOSIS — E669 Obesity, unspecified: Secondary | ICD-10-CM | POA: Insufficient documentation

## 2022-01-20 DIAGNOSIS — Z853 Personal history of malignant neoplasm of breast: Secondary | ICD-10-CM | POA: Insufficient documentation

## 2022-01-20 DIAGNOSIS — E1165 Type 2 diabetes mellitus with hyperglycemia: Secondary | ICD-10-CM | POA: Insufficient documentation

## 2022-01-20 DIAGNOSIS — Z90710 Acquired absence of both cervix and uterus: Secondary | ICD-10-CM | POA: Diagnosis not present

## 2022-01-20 LAB — CMP (CANCER CENTER ONLY)
ALT: 7 U/L (ref 0–44)
AST: 11 U/L — ABNORMAL LOW (ref 15–41)
Albumin: 4.1 g/dL (ref 3.5–5.0)
Alkaline Phosphatase: 71 U/L (ref 38–126)
Anion gap: 5 (ref 5–15)
BUN: 11 mg/dL (ref 8–23)
CO2: 29 mmol/L (ref 22–32)
Calcium: 9.7 mg/dL (ref 8.9–10.3)
Chloride: 108 mmol/L (ref 98–111)
Creatinine: 1.26 mg/dL — ABNORMAL HIGH (ref 0.44–1.00)
GFR, Estimated: 49 mL/min — ABNORMAL LOW
Glucose, Bld: 228 mg/dL — ABNORMAL HIGH (ref 70–99)
Potassium: 4.4 mmol/L (ref 3.5–5.1)
Sodium: 142 mmol/L (ref 135–145)
Total Bilirubin: 0.6 mg/dL (ref 0.3–1.2)
Total Protein: 6.7 g/dL (ref 6.5–8.1)

## 2022-01-20 LAB — CBC WITH DIFFERENTIAL (CANCER CENTER ONLY)
Abs Immature Granulocytes: 0.01 10*3/uL (ref 0.00–0.07)
Basophils Absolute: 0 10*3/uL (ref 0.0–0.1)
Basophils Relative: 0 %
Eosinophils Absolute: 0.2 10*3/uL (ref 0.0–0.5)
Eosinophils Relative: 3 %
HCT: 41.4 % (ref 36.0–46.0)
Hemoglobin: 12.7 g/dL (ref 12.0–15.0)
Immature Granulocytes: 0 %
Lymphocytes Relative: 26 %
Lymphs Abs: 2 10*3/uL (ref 0.7–4.0)
MCH: 29.5 pg (ref 26.0–34.0)
MCHC: 30.7 g/dL (ref 30.0–36.0)
MCV: 96.3 fL (ref 80.0–100.0)
Monocytes Absolute: 0.4 10*3/uL (ref 0.1–1.0)
Monocytes Relative: 5 %
Neutro Abs: 5 10*3/uL (ref 1.7–7.7)
Neutrophils Relative %: 66 %
Platelet Count: 257 10*3/uL (ref 150–400)
RBC: 4.3 MIL/uL (ref 3.87–5.11)
RDW: 13.8 % (ref 11.5–15.5)
WBC Count: 7.7 10*3/uL (ref 4.0–10.5)
nRBC: 0 % (ref 0.0–0.2)

## 2022-01-20 MED ORDER — PROCHLORPERAZINE MALEATE 10 MG PO TABS: 10.0000 mg | ORAL_TABLET | Freq: Four times a day (QID) | ORAL | 1 refills | Status: DC | PRN

## 2022-01-20 MED ORDER — ONDANSETRON HCL 8 MG PO TABS: 8.0000 mg | ORAL_TABLET | Freq: Two times a day (BID) | ORAL | 1 refills | Status: DC | PRN

## 2022-01-21 ENCOUNTER — Telehealth: Payer: Self-pay | Admitting: Hematology

## 2022-01-21 NOTE — Telephone Encounter (Signed)
Scheduled follow-up appointment per 6/1 los. Patient is aware.

## 2022-01-25 ENCOUNTER — Other Ambulatory Visit: Payer: Self-pay | Admitting: Nurse Practitioner

## 2022-01-25 ENCOUNTER — Other Ambulatory Visit: Payer: Self-pay

## 2022-01-25 DIAGNOSIS — C50212 Malignant neoplasm of upper-inner quadrant of left female breast: Secondary | ICD-10-CM

## 2022-01-25 MED ORDER — ONDANSETRON HCL 8 MG PO TABS: 8.0000 mg | ORAL_TABLET | Freq: Two times a day (BID) | ORAL | 1 refills | Status: DC | PRN

## 2022-01-25 MED ORDER — LACTULOSE 10 GM/15ML PO SOLN: 10.0000 g | Freq: Two times a day (BID) | ORAL | 0 refills | Status: AC

## 2022-01-25 MED ORDER — PROCHLORPERAZINE MALEATE 10 MG PO TABS: 10.0000 mg | ORAL_TABLET | Freq: Four times a day (QID) | ORAL | 1 refills | Status: DC | PRN

## 2022-01-25 NOTE — Progress Notes (Signed)
Pt is requesting her prescriptions to be sent to [place] and for immediate/emergent medications prescriptions to be sent to [facility] in [place], [place].  Pt called requesting the refill prescriptions for Zofran, Compazine, and lactulose be sent to [place].  Forwarded prescriptions to [facility].

## 2022-01-25 NOTE — Telephone Encounter (Signed)
Pt called stating she needs all her prescriptions to be filled by [facility] unless it's an emergent medication which will need to be filled by [facility] in [place], [place].

## 2022-02-14 ENCOUNTER — Other Ambulatory Visit: Payer: Self-pay | Admitting: Hematology

## 2022-03-01 ENCOUNTER — Encounter: Payer: Self-pay | Admitting: *Deleted

## 2022-03-02 ENCOUNTER — Encounter: Payer: Self-pay | Admitting: Neurology

## 2022-03-02 ENCOUNTER — Ambulatory Visit (INDEPENDENT_AMBULATORY_CARE_PROVIDER_SITE_OTHER): Payer: Medicare Other | Admitting: Neurology

## 2022-03-02 VITALS — BP 126/78 | HR 83 | Ht 66.5 in | Wt 256.0 lb

## 2022-03-02 DIAGNOSIS — R351 Nocturia: Secondary | ICD-10-CM | POA: Diagnosis not present

## 2022-03-02 DIAGNOSIS — R400 Somnolence: Secondary | ICD-10-CM | POA: Diagnosis not present

## 2022-03-02 DIAGNOSIS — G4733 Obstructive sleep apnea (adult) (pediatric): Secondary | ICD-10-CM

## 2022-03-02 NOTE — Patient Instructions (Signed)

## 2022-03-02 NOTE — Progress Notes (Signed)
Subjective:    Patient ID: Emma Casey is a 61 y.o. female.  HPI    Star Age, MD, PhD [facility] [address] [place], [place] [address]  Dear Dr. Huel Cote,   I saw your patient, Emma Casey, upon your kind request in my sleep clinic today for initial consultation of her sleep disorder, in particular, evaluation of her prior diagnosis of obstructive sleep apnea.  The patient is unaccompanied today.  As you know, Emma Casey is a 61 year old right-handed woman with an underlying medical history of breast cancer (s/p L sided surgery in 22-Nov-2018, chemotherapy and radiation), history of arthritis, back pain, depression, anxiety, reflux disease, hypertension, hyperlipidemia, lymphedema, prediabetes, and severe obesity with a BMI of over 40, who was previously diagnosed with obstructive sleep apnea and placed on PAP therapy. I was able to review some of her prior sleep study records. She had a sleep study at [facility] on 09/06/2010, reportedly showing severe obstructive sleep apnea.  She had a titration study at [facility] sleep center on 09/29/2010 which indicated resolution of her sleep disordered breathing with 17 cm of water pressure.  Study was interpreted by Dr. Brandon Melnick. I reviewed your office note from 01/05/2022.  She was unable to tolerate CPAP therapy at the time. She reports that she tried different types of mask options, most recently nasal pillows by her description.  She has tried using her machine about a month ago, she did not bring her machine for today's visit, a compliance download is not available.   Her Epworth sleepiness score is 22 out of 24, fatigue severity score is 59 out of 63. She lives with her roommate, she is widowed, husband passed away in 2013/11/21, she has 2 stepsons.  She quit smoking yesterday, she does not drink alcohol currently, does not drink caffeine daily.  Bedtime is generally around 9 and rise time around  8.  He has significant nocturia about 6 or 7 times per average night.  She does not have recurrent morning headaches or nocturnal headaches.  Weight has been more or less stable lately.  At the time of her sleep testing in 11-22-10 she weighed around 293 pounds.  Her highest weight has been around 300 pounds.  Her Past Medical History Is Significant For: Past Medical History:  Diagnosis Date   Anxiety    Arthritis    right hand   Back pain    Breast cancer ([place])    Bronchitis    Cancer ([place]) 11/2018   left breast ca   Chronic fatigue syndrome    Constipation    Depression    Diabetes mellitus without complication (HCC)    Family history of breast cancer    GERD (gastroesophageal reflux disease)    Headache    Heart murmur    Herpes 1&2    Hyperlipidemia    Hypertension    Joint pain    Leg fracture, left    as a child   Lymphedema    Multiple food allergies    OSA on CPAP    has CPAP but has not used in over a month   Personal history of chemotherapy    Personal history of radiation therapy    Prediabetes    Shortness of breath     Her Past Surgical History Is Significant For: Past Surgical History:  Procedure Laterality Date   ABDOMINAL HYSTERECTOMY     BREAST LUMPECTOMY WITH RADIOACTIVE SEED AND SENTINEL  LYMPH NODE BIOPSY Left 01/08/2019   Procedure: LEFT BREAST LUMPECTOMY WITH RADIOACTIVE SEED AND LEFT AXILLARY DEEP SENTINEL LYMPH NODE BIOPSY WITH BLUE DYE INJECTION;  Surgeon: Fanny Skates, MD;  Location: [place];  Service: General;  Laterality: Left;   COLONOSCOPY N/A 09/02/2015   Procedure: COLONOSCOPY;  Surgeon: Danie Binder, MD;  [facility];  Service: Endoscopy;  Laterality: N/A;  11:15 Am   EYE SURGERY Right    cataract   HAND SURGERY Right    broken bone   MASS EXCISION Left 10/01/2021   Procedure: EXCISION MASS LEFT PALM;  Surgeon: Daryll Brod, MD;  Location: [place];  Service: Orthopedics;  Laterality: Left;    PORT-A-CATH REMOVAL Right 08/21/2019   Procedure: REMOVAL PORT-A-CATH;  Surgeon: Fanny Skates, MD;  Location: [place];  Service: General;  Laterality: Right;   PORTACATH PLACEMENT N/A 03/01/2019   Procedure: INSERTION PORT-A-CATH;  Surgeon: Fanny Skates, MD;  Location: [place];  Service: General;  Laterality: N/A;   RE-EXCISION OF BREAST LUMPECTOMY Left 03/01/2019   Procedure: RE-EXCISION OF LEFT BREAST LUMPECTOMY MULITPLE MARGINS;  Surgeon: Fanny Skates, MD;  Location: [place];  Service: General;  Laterality: Left;   TUBAL LIGATION      Her Family History Is Significant For: Family History  Problem Relation Age of Onset   Stroke Mother    Transient ischemic attack Father    Heart attack Father    Hypertension Father    Hyperlipidemia Father    Heart disease Father    Sleep apnea Father    Stroke Brother    Breast cancer Maternal Aunt 26   Breast cancer Cousin        mat first cousin   Breast cancer Cousin        mat first cousin   Cancer Cousin        mat first cousin    Her Social History Is Significant For: Social History   Socioeconomic History   Marital status: Widowed    Spouse name: Not on file   Number of children: Not on file   Years of education: Not on file   Highest education level: Not on file  Occupational History   Occupation: on disability  Tobacco Use   Smoking status: Former    Packs/day: 0.25    Years: 10.00    Total pack years: 2.50    Types: Cigarettes    Quit date: 03/01/2022   Smokeless tobacco: Never   Tobacco comments:    1-2 cigarette per day  Vaping Use   Vaping Use: Never used  Substance and Sexual Activity   Alcohol use: No   Drug use: No   Sexual activity: Not on file  Other Topics Concern   Not on file  Social History Narrative   Caffeine 1 cup coffee once week, soda- Sprite.    Education: 12th grade.   Work disabilty-broke R Acupuncturist. CMA work,  L hand csyt removed.  Breast Ca/ lymphedema.    Social  Determinants of Health   Financial Resource Strain: Not on file  Food Insecurity: Not on file  Transportation Needs: No Transportation Needs (06/11/2019)   PRAPARE - Hydrologist (Medical): No    Lack of Transportation (Non-Medical): No  Physical Activity: Not on file  Stress: Not on file  Social Connections: Not on file    Her Allergies Are:  Allergies  Allergen Reactions   Asa [Aspirin] Hives  Nsaids Hives   Penicillins     Tolerated Ancef 01/08/2019 Has patient had a PCN reaction causing immediate rash, facial/tongue/throat swelling, SOB or lightheadedness with hypotension: Yesyes Has patient had a PCN reaction causing severe rash involving mucus membranes or skin necrosis: Nono Has patient had a PCN reaction that required hospitalization Nono Has patient had a PCN reaction occurring within the last 10 years: Nono If all of the above answers are "NO", then may proceed with Cephalosporin  :   Her Current Medications Are:  Outpatient Encounter Medications as of 03/02/2022  Medication Sig   acyclovir (ZOVIRAX) 400 MG tablet Take 400 mg by mouth 2 (two) times daily.   amLODipine (NORVASC) 5 MG tablet Take 5 mg by mouth daily.   Ascorbic Acid (VITAMIN C) 1000 MG tablet Take 1,000 mg by mouth daily.   atorvastatin (LIPITOR) 20 MG tablet Take 20 mg by mouth daily.   diphenhydrAMINE (BENADRYL) 25 MG tablet Take 25 mg by mouth every 6 (six) hours as needed.   fluticasone (FLONASE) 50 MCG/ACT nasal spray Place 2 sprays into both nostrils daily.    folic acid (FOLVITE) 366 MCG tablet Take 400 mcg by mouth daily.    gabapentin (NEURONTIN) 300 MG capsule TAKE 1 CAPSULE BY MOUTH  TWICE DAILY (Patient taking differently: '600mg'$  in AM and PM, '300mg'$  at 1200 if needed.)   lactulose (CHRONULAC) 10 GM/15ML solution Take 15 mLs (10 g total) by mouth 2 (two) times daily.   letrozole (FEMARA) 2.5 MG tablet TAKE 1 TABLET BY MOUTH  DAILY   lisinopril (ZESTRIL) 2.5 MG  tablet Take 2.5 mg by mouth at bedtime.   loratadine (CLARITIN) 10 MG tablet Take 10 mg by mouth daily.   metFORMIN (GLUCOPHAGE-XR) 500 MG 24 hr tablet 1,000 mg 2 (two) times daily.   montelukast (SINGULAIR) 10 MG tablet Take 10 mg by mouth at bedtime.   Multiple Vitamin (MULTIVITAMIN) capsule Take 1 capsule by mouth daily.   omeprazole (PRILOSEC) 20 MG capsule Take 20 mg by mouth daily.   ondansetron (ZOFRAN) 8 MG tablet Take 1 tablet (8 mg total) by mouth 2 (two) times daily as needed for refractory nausea / vomiting.   PARoxetine (PAXIL) 10 MG tablet Take 10 mg by mouth at bedtime.   potassium chloride (MICRO-K) 10 MEQ CR capsule Take 10 mEq by mouth 2 (two) times daily.   prochlorperazine (COMPAZINE) 10 MG tablet Take 1 tablet (10 mg total) by mouth every 6 (six) hours as needed (Nausea or vomiting).   tiZANidine (ZANAFLEX) 4 MG tablet Take 4 mg by mouth 3 (three) times daily.   topiramate (TOPAMAX) 100 MG tablet Take 100 mg by mouth daily.   vitamin B-12 (CYANOCOBALAMIN) 500 MCG tablet Take 500 mcg by mouth daily.    No facility-administered encounter medications on file as of 03/02/2022.  :   Review of Systems:  Out of a complete 14 point review of systems, all are reviewed and negative with the exception of these symptoms as listed below:  Review of Systems  Neurological:        Hx of OSA not tolerating CPAP due to feeling like she is suffocating with mask. Not sleep well, snoring, waking frequently, apnea.  Last SS in 2012. (Dr. Scotty Court, retired, in [place]).  ESS 22, FSS 59.    Objective:  Neurological Exam  Physical Exam Physical Examination:   Vitals:   03/02/22 1158  BP: 126/78  Pulse: 83    General Examination: The patient is a  very pleasant 61 y.o. female in no acute distress. She appears well-developed and well-nourished and well groomed.   HEENT: Normocephalic, atraumatic, pupils are equal, round and reactive to light, extraocular tracking is good without limitation  to gaze excursion or nystagmus noted. Hearing is grossly intact. Face is symmetric with normal facial animation. Speech is clear with no dysarthria noted. There is no hypophonia. There is no lip, neck/head, jaw or voice tremor. Neck is supple with full range of passive and active motion. There are no carotid bruits on auscultation. Oropharynx exam reveals: moderate mouth dryness, edentulous, marked airway crowding secondary to redundant soft palate and wider tongue.  Mallampati class IV, tonsils and uvula not visualized.  Tongue protrudes centrally.  Neck circumference 15-3/8 inches.   Chest: Clear to auscultation without wheezing, rhonchi or crackles noted.  Heart: S1+S2+0, regular and normal without murmurs, rubs or gallops noted.   Abdomen: Soft, non-tender and non-distended.  Extremities: There is no pitting edema in the distal lower extremities bilaterally.   Skin: Warm and dry without trophic changes noted.   Musculoskeletal: exam reveals no obvious joint deformities.   Neurologically:  Mental status: The patient is awake, alert and oriented in all 4 spheres. Her immediate and remote memory, attention, language skills and fund of knowledge are appropriate. There is no evidence of aphasia, agnosia, apraxia or anomia. Speech is clear with normal prosody and enunciation. Thought process is linear. Mood is normal and affect is normal.  Cranial nerves II - XII are as described above under HEENT exam.  Motor exam: Normal bulk, strength and tone is noted. There is no obvious tremor. Fine motor skills and coordination: grossly intact.  Cerebellar testing: No dysmetria or intention tremor. There is no truncal or gait ataxia.  Sensory exam: intact to light touch in the upper and lower extremities.  Gait, station and balance: She stands easily. No veering to one side is noted. No leaning to one side is noted. Posture is age-appropriate and stance is narrow based. Gait shows normal stride length and  normal pace. No problems turning are noted. Assessment and Plan:  In summary, Emma Casey is a very pleasant 61 y.o.-year old female with an underlying medical history of breast cancer (s/p surgery in 2020, chemotherapy and radiation), history of arthritis, back pain, depression, anxiety, reflux disease, hypertension, hyperlipidemia, lymphedema, prediabetes, and severe obesity with a BMI of over 62, who presents for evaluation of her obstructive sleep apnea which was in the severe range over 10 years ago.  She has not used CPAP consistently due to difficulty tolerating the interface and perhaps also the pressure.  She has had some weight fluctuation over time. She is advised to proceed with reevaluation with sleep testing.  I had a long chat with the patient about my findings and the diagnosis of sleep apnea , particularly OSA, its prognosis and treatment options. We talked about medical/conservative treatments, surgical interventions and non-pharmacological approaches for symptom control. I explained, in particular, the risks and ramifications of untreated moderate to severe OSA, especially with respect to developing cardiovascular disease down the road, including congestive heart failure (CHF), difficult to treat hypertension, cardiac arrhythmias (particularly A-fib), neurovascular complications including TIA, stroke and dementia. Even type 2 diabetes has, in part, been linked to untreated OSA. Symptoms of untreated OSA may include (but may not be limited to) daytime sleepiness, nocturia (i.e. frequent nighttime urination), memory problems, mood irritability and suboptimally controlled or worsening mood disorder such as depression and/or anxiety, lack of  energy, lack of motivation, physical discomfort, as well as recurrent headaches, especially morning or nocturnal headaches. We talked about the importance of maintaining a healthy lifestyle and striving for healthy weight.  The importance of complete  smoking cessation was also addressed.  In addition, we talked about the importance of striving for and maintaining good sleep hygiene.  She is strongly advised not to drive, she is very sleepy during the day. I recommended the following at this time: sleep study.  I outlined the differences between a laboratory attended sleep study which is considered more comprehensive and accurate over the option of a home sleep test (HST); the latter may lead to underestimation of sleep disordered breathing in some instances and does not help with diagnosing upper airway resistance syndrome and is not accurate enough to diagnose primary central sleep apnea typically. I explained the different sleep test procedures to the patient in detail and also outlined possible surgical and non-surgical treatment options of OSA, including the use of a pressure airway pressure (PAP) device (ie CPAP, AutoPAP/APAP or BiPAP in certain circumstances), a custom-made dental device (aka oral appliance, which would require a referral to a specialist dentist or orthodontist typically, and is generally speaking not considered a good choice for patients with full dentures or edentulous state), upper airway surgical options, such as traditional UPPP (which is not considered a first-line treatment) or the Inspire device (hypoglossal nerve stimulator, which would involve a referral for consultation with an ENT surgeon, after careful selection, following inclusion criteria).  I explained to her that inspire requires prior CPAP trial and it is recommended that patient is doing his weight before embarking on inspire treatment.  I currently would not recommend inspire for her.  She is not a candidate for dental device due to being edentulous.  We may be able to help her with PAP tolerance; she indicated that she would be willing to try PAP therapy again, if the need arises.   We will pick up our discussion about the next steps and treatment options after  testing.  We will keep her posted as to the test results by phone call and/or MyChart messaging where possible.  We will plan to follow-up in sleep clinic accordingly as well.  I answered all her questions today and the patient was in agreement.   I encouraged her to call with any interim questions, concerns, problems or updates or email [place] through [place].  Generally speaking, sleep test authorizations may take up to 2 weeks, sometimes less, sometimes longer, the patient is encouraged to get in touch with [place] if they do not hear back from the sleep lab staff directly within the next 2 weeks.  Thank you very much for allowing me to participate in the care of this nice patient. If I can be of any further assistance to you please do not hesitate to call me at [phone].  Sincerely,   Star Age, MD, PhD

## 2022-03-03 ENCOUNTER — Telehealth: Payer: Self-pay | Admitting: Neurology

## 2022-03-03 NOTE — Telephone Encounter (Signed)
[employer]- [employer] medicare no auth req. Patient is scheduled at [facility] for 03/23/22 at 8 pm.  I mailed the packet to the patient.

## 2022-03-14 ENCOUNTER — Other Ambulatory Visit: Payer: Self-pay

## 2022-03-16 ENCOUNTER — Ambulatory Visit
Admission: RE | Admit: 2022-03-16 | Discharge: 2022-03-16 | Disposition: A | Discharge status: Home / Self Care | Payer: Medicare Other | Source: Ambulatory Visit | Attending: Hematology | Admitting: Hematology

## 2022-03-16 DIAGNOSIS — Z853 Personal history of malignant neoplasm of breast: Secondary | ICD-10-CM

## 2022-03-18 ENCOUNTER — Other Ambulatory Visit: Payer: Self-pay

## 2022-03-18 DIAGNOSIS — Z170 Estrogen receptor positive status [ER+]: Secondary | ICD-10-CM

## 2022-03-18 MED ORDER — PROCHLORPERAZINE MALEATE 10 MG PO TABS: 10.0000 mg | ORAL_TABLET | Freq: Four times a day (QID) | ORAL | 1 refills | Status: DC | PRN

## 2022-03-19 ENCOUNTER — Other Ambulatory Visit: Payer: Self-pay

## 2022-03-23 ENCOUNTER — Ambulatory Visit (INDEPENDENT_AMBULATORY_CARE_PROVIDER_SITE_OTHER): Payer: Medicare Other | Admitting: Neurology

## 2022-03-23 DIAGNOSIS — R351 Nocturia: Secondary | ICD-10-CM

## 2022-03-23 DIAGNOSIS — G4733 Obstructive sleep apnea (adult) (pediatric): Secondary | ICD-10-CM

## 2022-03-23 DIAGNOSIS — Z9989 Dependence on other enabling machines and devices: Secondary | ICD-10-CM

## 2022-03-23 DIAGNOSIS — R400 Somnolence: Secondary | ICD-10-CM

## 2022-03-30 ENCOUNTER — Encounter (INDEPENDENT_AMBULATORY_CARE_PROVIDER_SITE_OTHER): Payer: Self-pay

## 2022-03-30 NOTE — Procedures (Signed)
See under Media tab.

## 2022-03-30 NOTE — Addendum Note (Signed)
Addended by: Star Age on: 03/30/2022 05:28 PM   Modules accepted: Orders

## 2022-03-31 ENCOUNTER — Encounter: Payer: Self-pay | Admitting: Nurse Practitioner

## 2022-04-01 ENCOUNTER — Other Ambulatory Visit: Payer: Self-pay

## 2022-04-05 ENCOUNTER — Telehealth: Payer: Self-pay | Admitting: *Deleted

## 2022-04-05 ENCOUNTER — Other Ambulatory Visit: Payer: Self-pay | Admitting: Nurse Practitioner

## 2022-04-05 ENCOUNTER — Encounter: Payer: Self-pay | Admitting: *Deleted

## 2022-04-05 DIAGNOSIS — C50212 Malignant neoplasm of upper-inner quadrant of left female breast: Secondary | ICD-10-CM

## 2022-04-05 DIAGNOSIS — Z170 Estrogen receptor positive status [ER+]: Secondary | ICD-10-CM

## 2022-04-05 MED ORDER — PROCHLORPERAZINE MALEATE 10 MG PO TABS: 10.0000 mg | ORAL_TABLET | Freq: Four times a day (QID) | ORAL | 3 refills | Status: DC | PRN

## 2022-04-05 MED ORDER — ONDANSETRON HCL 8 MG PO TABS: 8.0000 mg | ORAL_TABLET | Freq: Two times a day (BID) | ORAL | 3 refills | Status: AC | PRN

## 2022-04-05 NOTE — Telephone Encounter (Signed)
I called pt. I advised pt that Dr. Rexene Alberts reviewed their sleep study results and found that pt has severe OSA. Dr. Rexene Alberts recommends that pt start BIPAP. I reviewed PAP compliance expectations with the pt. Pt is agreeable to starting a BiPAP. I advised pt that an order will be sent to a DME, [facility], and they will call the pt within about one week after they file with the pt's insurance. [facility]  will show the pt how to use the machine, fit for masks, and troubleshoot the CPAP if needed. A follow up appt was made for insurance purposes with Dr. Rexene Alberts on 06-13-2022 at 1045. Pt verbalized understanding to arrive 15 minutes early and bring their CPAP. A letter with all of this information in it will be mailed to the pt as a reminder. I verified with the pt that the address we have on file is correct. Pt verbalized understanding of results. Pt had no questions at this time but was encouraged to call back if questions arise. I have sent the order to [facility]  and have received confirmation that they have received the order.

## 2022-04-05 NOTE — Telephone Encounter (Signed)
-----   Message from Star Age, MD sent at 03/30/2022  5:28 PM EDT ----- Patient referred by Dr. Huel Cote for re-eval of her OSA, seen by me on 03/02/22, patient had a split night sleep study on 03/23/22.  New report did not come through Epic, will have to be scanned under Media. Please call and notify patient that the recent sleep study showed severe obstructive sleep apnea (OSA). She did fairly well with a BiPAP machine and CPAP was not enough to treat her severe OSA. She did not get full resolution of her sleep apnea on the BiPAP trial, but I would like to get her on home autoBiPAP for now. I will write for a machine for home use. I placed the order in the chart.  Please advise patient that we will need a follow up appointment with either myself or one of our nurse practitioners in about 2-3 months post set-up to check for how they are doing on treatment and how well it's going with the machine in general. Most insurance company require a certain compliance percentage to continue to cover/pay for the machine. Please ask patient to schedule this FU appointment, according to the set-up date, which is the day they receive the machine. Please make sure, the patient understands the importance of keeping this window for the FU appointment, as it is often an Designer, industrial/product and not our rule. Failing to adhere to this may result in losing coverage for sleep apnea treatment, at which point some insurances require repeating the whole process. Plus, monitoring compliance data is usually good feedback for the patient as far as how they are doing, how many hours they are on it, how well the mask fits, etc.  Also remind patient, that any PAP machine or mask issues should be first addressed with the DME company, who provided the machine/supplies.  Please ask if patient has a preference regarding DME company, may depend on the insurance too.  Star Age, MD, PhD [facility])

## 2022-04-19 ENCOUNTER — Other Ambulatory Visit: Payer: Self-pay

## 2022-04-19 ENCOUNTER — Encounter: Payer: Self-pay | Admitting: Physical Therapy

## 2022-04-19 ENCOUNTER — Ambulatory Visit: Payer: Medicare Other | Attending: General Surgery | Admitting: Physical Therapy

## 2022-04-19 DIAGNOSIS — R293 Abnormal posture: Secondary | ICD-10-CM | POA: Diagnosis present

## 2022-04-19 DIAGNOSIS — I890 Lymphedema, not elsewhere classified: Secondary | ICD-10-CM | POA: Diagnosis present

## 2022-04-19 DIAGNOSIS — C50212 Malignant neoplasm of upper-inner quadrant of left female breast: Secondary | ICD-10-CM | POA: Diagnosis present

## 2022-04-19 DIAGNOSIS — M25612 Stiffness of left shoulder, not elsewhere classified: Secondary | ICD-10-CM | POA: Insufficient documentation

## 2022-04-19 NOTE — Therapy (Signed)
OUTPATIENT PHYSICAL THERAPY ONCOLOGY EVALUATION  Patient Name: Emma Casey MRN: [id number] DOB:[birth year], 61 y.o., female Today's Date: 04/19/2022   PT End of Session - 04/19/22 1354     Visit Number 1    Number of Visits 9    Date for PT Re-Evaluation 05/17/22    PT Start Time 2446    PT Stop Time 1441    PT Time Calculation (min) 47 min    Activity Tolerance Patient tolerated treatment well    Behavior During Therapy [facility] for tasks assessed/performed             Past Medical History:  Diagnosis Date   Anxiety    Arthritis    right hand   Back pain    Breast cancer ([place])    Bronchitis    Cancer ([place]) 11/2018   left breast ca   Chronic fatigue syndrome    Constipation    Depression    Diabetes mellitus without complication ([place])    Family history of breast cancer    GERD (gastroesophageal reflux disease)    Headache    Heart murmur    Herpes 1&2    Hyperlipidemia    Hypertension    Joint pain    Leg fracture, left    as a child   Lymphedema    Multiple food allergies    OSA on CPAP    has CPAP but has not used in over a month   Personal history of chemotherapy    Personal history of radiation therapy    Prediabetes    Shortness of breath    Past Surgical History:  Procedure Laterality Date   ABDOMINAL HYSTERECTOMY     BREAST LUMPECTOMY WITH RADIOACTIVE SEED AND SENTINEL LYMPH NODE BIOPSY Left 01/08/2019   Procedure: LEFT BREAST LUMPECTOMY WITH RADIOACTIVE SEED AND LEFT AXILLARY DEEP SENTINEL LYMPH NODE BIOPSY WITH BLUE DYE INJECTION;  Surgeon: Fanny Skates, MD;  Location: [place];  Service: General;  Laterality: Left;   COLONOSCOPY N/A 09/02/2015   Procedure: COLONOSCOPY;  Surgeon: Danie Binder, MD;  [facility];  Service: Endoscopy;  Laterality: N/A;  11:15 Am   EYE SURGERY Right    cataract   HAND SURGERY Right    broken bone   MASS EXCISION Left 10/01/2021   Procedure: EXCISION MASS LEFT PALM;  Surgeon: Daryll Brod, MD;  Location: [place];  Service: Orthopedics;  Laterality: Left;   PORT-A-CATH REMOVAL Right 08/21/2019   Procedure: REMOVAL PORT-A-CATH;  Surgeon: Fanny Skates, MD;  Location: [place];  Service: General;  Laterality: Right;   PORTACATH PLACEMENT N/A 03/01/2019   Procedure: INSERTION PORT-A-CATH;  Surgeon: Fanny Skates, MD;  Location: [place];  Service: General;  Laterality: N/A;   RE-EXCISION OF BREAST LUMPECTOMY Left 03/01/2019   Procedure: RE-EXCISION OF LEFT BREAST LUMPECTOMY MULITPLE MARGINS;  Surgeon: Fanny Skates, MD;  Location: [place];  Service: General;  Laterality: Left;   TUBAL LIGATION     Patient Active Problem List   Diagnosis Date Noted   Morbid (severe) obesity due to excess calories ([place]) 11/25/2019   Port-A-Cath in place 03/22/2019   Chemotherapy-induced fatigue 03/22/2019   Genetic testing 01/29/2019   Family history of breast cancer    Infiltrating ductal carcinoma of left breast ([place]) 01/03/2019   Malignant neoplasm of upper-inner quadrant of left breast in female, estrogen receptor positive ([place]) 12/20/2018   Essential hypertension 10/02/2017   Gastroesophageal reflux disease without esophagitis  10/02/2017   Hyperlipidemia 10/02/2017   OSA (obstructive sleep apnea) 10/02/2017   Type 2 diabetes mellitus with complication, without long-term current use of insulin ([place]) 10/02/2017   Special screening for malignant neoplasms, colon     PCP: Catalina Antigua, MD  REFERRING PROVIDER: Stark Klein, MD  REFERRING DIAG: N64.4 Breast pain, L; Malignant neoplasm of upper-inner quadrant of left breast in female, estrogen receptor positive C50.212  THERAPY DIAG:  Lymphedema, not elsewhere classified  Stiffness of left shoulder, not elsewhere classified  Abnormal posture  Malignant neoplasm of upper-inner quadrant of left female breast, unspecified estrogen receptor status ([place])  ONSET DATE: 12/2018  Rationale for  Evaluation and Treatment Rehabilitation  SUBJECTIVE                                                                                                                                                                                           SUBJECTIVE STATEMENT: The pain has been going on a while. The doctor told me to stop wearing the bra. The doctor told me there was some lymphedema in the breast.  PERTINENT HISTORY:   DCIS of the L breast with L breast lumpectomy and 1 lymph node removal and then re-excision,ER+, PR+, HER2-, 03/01/2019 re-excision due to unclean margins without any significant findings. Chemotherapy and radiation were performed.   PAIN:  Are you having pain? Yes NPRS scale: 5/10 Pain location: L breast Pain orientation: Left  PAIN TYPE: sometimes pain is stabbing, other times is dull Pain description: intermittent  Aggravating factors: nothing  Relieving factors: gabapentin, muscle relaxer  PRECAUTIONS: Other: L breast lymphedema  WEIGHT BEARING RESTRICTIONS No  FALLS:  Has patient fallen in last 6 months? No  LIVING ENVIRONMENT: Lives with:  room mate but will be moving soon Lives in: House/apartment Has following equipment at home: [place] - 4 wheeled  OCCUPATION: on disability  LEISURE: pt reports she walks at her sisters about every day for 10 minutes  HAND DOMINANCE : right   PRIOR LEVEL OF FUNCTION: Independent  PATIENT GOALS to decrease pain and learn how to manage swelling   OBJECTIVE  COGNITION:  Overall cognitive status: Within functional limits for tasks assessed   PALPATION: Increased muscle tightness throughout L pec  OBSERVATIONS / OTHER ASSESSMENTS: increased pore size in L breast due to swelling  POSTURE: forward head, rounded shoulders  UPPER EXTREMITY AROM/PROM:  A/PROM RIGHT   eval   Shoulder extension 57  Shoulder flexion 141  Shoulder abduction 101  Shoulder internal rotation 30  Shoulder external rotation 89    (Blank  rows = not tested)  A/PROM LEFT   eval  Shoulder extension 50  Shoulder flexion 125  Shoulder abduction 83  Shoulder internal rotation 64  Shoulder external rotation 71    (Blank rows = not tested)    LYMPHEDEMA ASSESSMENTS:   SURGERY TYPE/DATE: L lumpectomy and SLNB 01/08/2019 with re-exicision on 03/01/2019  NUMBER OF LYMPH NODES REMOVED: 1  CHEMOTHERAPY: completed  RADIATION:completed  HORMONE TREATMENT: yes - letrozole  INFECTIONS: none  LYMPHEDEMA ASSESSMENTS:   LANDMARK RIGHT  eval  10 cm proximal to olecranon process 41.2  Olecranon process 28  10 cm proximal to ulnar styloid process 19  Just proximal to ulnar styloid process 16.3  Across hand at thumb web space 19.5  At base of 2nd digit 6.5  (Blank rows = not tested)  LANDMARK LEFT  eval  10 cm proximal to olecranon process 43  Olecranon process 29.5  10 cm proximal to ulnar styloid process 20  Just proximal to ulnar styloid process 17  Across hand at thumb web space 19.9  At base of 2nd digit 6.6  (Blank rows = not tested)   BREAST IMPAIRMENT SURVEY: 74   TODAY'S TREATMENT  04/19/22: Began MLD to L breast as follows in supine while educating pt throughout: short neck, 5 diaphragmatic breaths, R axillary nodes and establishment of interaxillary pathway, L inguinal nodes and establishment of axillo inguinal pathway, L breast moving fluid towards pathways then retracing all steps STM to L pec in areas of tightness  PATIENT EDUCATION:  Education details: need for compression bra, basic principles of self MLD Person educated: Patient Education method: Explanation Education comprehension: verbalized understanding   HOME EXERCISE PROGRAM: Self MLD and massage to L pec  ASSESSMENT:  CLINICAL IMPRESSION: Patient is a 61 y.o. female who was seen today for physical therapy evaluation and treatment for left breast pain. Pt reports she has had L breast pain since her surgery in 2020 and it has been  worsening lately to the point she cries at night. She is unable to sleep due to pain. She tries to massage her breast to decrease pain. She has increased edema in L breast and also increased muscle tightness across L pec. Her L shoulder ROM is limited. Pt would benefit from skilled PT services to improve L shoulder ROM, decrease L breast swelling and pain and educate pt in being able to independently manage her lymphedema.     OBJECTIVE IMPAIRMENTS decreased ROM, decreased strength, increased edema, increased fascial restrictions, impaired UE functional use, postural dysfunction, and pain.   ACTIVITY LIMITATIONS lifting and reach over head  PARTICIPATION LIMITATIONS:  none reported  PERSONAL FACTORS Time since onset of injury/illness/exacerbation and Transportation are also affecting patient's functional outcome.   REHAB POTENTIAL: Good  CLINICAL DECISION MAKING: Stable/uncomplicated  EVALUATION COMPLEXITY: Low  GOALS: Goals reviewed with patient? Yes  SHORT TERM GOALS=LONG TERM GOALS Target date: 05/17/2022    Pt will report a 75% improvement in pain in L breast to allow improved comfort and allow pt to sleep at night.  Baseline: Goal status: INITIAL  2.  Pt will be independent in self MLD for long term management of lymphedema.  Baseline:  Goal status: INITIAL  3.  Pt will obtain appropriately fitting compression bra for long term management of lymphedema.  Baseline:  Goal status: INITIAL  4.  Pt will be independent in a home exercise program for continued stretching/strengthening of L shoulder.  Baseline:  Goal status: INITIAL  PLAN: PT FREQUENCY: 2x/week  PT DURATION: 4 weeks  PLANNED INTERVENTIONS: Therapeutic exercises,  Therapeutic activity, Patient/Family education, Self Care, Joint mobilization, Orthotic/Fit training, Manual lymph drainage, Compression bandaging, scar mobilization, Taping, Vasopneumatic device, and Manual therapy  PLAN FOR NEXT SESSION: instruct in  self MLD and give handout, assess current compression bra for fit, give ROM exercises for L shoulder   Manus Gunning, PT 04/19/2022, 2:51 PM

## 2022-04-29 ENCOUNTER — Encounter: Payer: Self-pay | Admitting: Plastic Surgery

## 2022-04-29 ENCOUNTER — Ambulatory Visit (INDEPENDENT_AMBULATORY_CARE_PROVIDER_SITE_OTHER): Payer: Medicare Other | Admitting: Plastic Surgery

## 2022-04-29 DIAGNOSIS — N651 Disproportion of reconstructed breast: Secondary | ICD-10-CM

## 2022-04-29 DIAGNOSIS — M549 Dorsalgia, unspecified: Secondary | ICD-10-CM | POA: Diagnosis not present

## 2022-04-29 DIAGNOSIS — N6489 Other specified disorders of breast: Secondary | ICD-10-CM

## 2022-04-29 DIAGNOSIS — F17210 Nicotine dependence, cigarettes, uncomplicated: Secondary | ICD-10-CM

## 2022-04-29 DIAGNOSIS — C50212 Malignant neoplasm of upper-inner quadrant of left female breast: Secondary | ICD-10-CM | POA: Diagnosis not present

## 2022-04-29 DIAGNOSIS — Z803 Family history of malignant neoplasm of breast: Secondary | ICD-10-CM

## 2022-04-29 DIAGNOSIS — E118 Type 2 diabetes mellitus with unspecified complications: Secondary | ICD-10-CM

## 2022-04-29 DIAGNOSIS — Z170 Estrogen receptor positive status [ER+]: Secondary | ICD-10-CM

## 2022-04-29 NOTE — Progress Notes (Signed)
Patient ID: Emma Casey, female    DOB: Sep 10, 1960, 61 y.o.   MRN: [id number]   Chief Complaint  Patient presents with   Consult    The patient is a 61 year old female here for evaluation of her breasts.  She underwent a partial mastectomy of the left breast 3 years ago with Dr. [name].  She was then followed by radiation.  She does have a family history of breast cancer.  She smokes occasionally.  She does have diabetes and this year her hemoglobin A1c was 7.5.  Last mammogram was July 2023.  She has asymmetry with the left breast being larger and smaller than the right.  She finds it extremely difficult to fit into one bra.    Review of Systems  Constitutional: Negative.   HENT: Negative.    Eyes: Negative.   Respiratory: Negative.  Negative for chest tightness and shortness of breath.   Cardiovascular: Negative.   Gastrointestinal: Negative.   Endocrine: Negative.   Genitourinary: Negative.   Musculoskeletal:  Positive for back pain.  Skin: Negative.     Past Medical History:  Diagnosis Date   Anxiety    Arthritis    right hand   Back pain    Breast cancer ([place])    Bronchitis    Cancer ([place]) 11/2018   left breast ca   Chronic fatigue syndrome    Constipation    Depression    Diabetes mellitus without complication ([facility])    Family history of breast cancer    GERD (gastroesophageal reflux disease)    Headache    Heart murmur    Herpes 1&2    Hyperlipidemia    Hypertension    Joint pain    Leg fracture, left    as a child   Lymphedema    Multiple food allergies    OSA on CPAP    has CPAP but has not used in over a month   Personal history of chemotherapy    Personal history of radiation therapy    Prediabetes    Shortness of breath     Past Surgical History:  Procedure Laterality Date   ABDOMINAL HYSTERECTOMY     BREAST LUMPECTOMY WITH RADIOACTIVE SEED AND SENTINEL LYMPH NODE BIOPSY Left 01/08/2019   Procedure: LEFT BREAST LUMPECTOMY WITH  RADIOACTIVE SEED AND LEFT AXILLARY DEEP SENTINEL LYMPH NODE BIOPSY WITH BLUE DYE INJECTION;  Surgeon: Fanny Skates, MD;  Location: [place];  Service: General;  Laterality: Left;   COLONOSCOPY N/A 09/02/2015   Procedure: COLONOSCOPY;  Surgeon: Danie Binder, MD;  [facility];  Service: Endoscopy;  Laterality: N/A;  11:15 Am   EYE SURGERY Right    cataract   HAND SURGERY Right    broken bone   MASS EXCISION Left 10/01/2021   Procedure: EXCISION MASS LEFT PALM;  Surgeon: Daryll Brod, MD;  Location: [place];  Service: Orthopedics;  Laterality: Left;   PORT-A-CATH REMOVAL Right 08/21/2019   Procedure: REMOVAL PORT-A-CATH;  Surgeon: Fanny Skates, MD;  Location: [place];  Service: General;  Laterality: Right;   PORTACATH PLACEMENT N/A 03/01/2019   Procedure: INSERTION PORT-A-CATH;  Surgeon: Fanny Skates, MD;  Location: [place];  Service: General;  Laterality: N/A;   RE-EXCISION OF BREAST LUMPECTOMY Left 03/01/2019   Procedure: RE-EXCISION OF LEFT BREAST LUMPECTOMY MULITPLE MARGINS;  Surgeon: Fanny Skates, MD;  Location: [place];  Service: General;  Laterality: Left;   TUBAL LIGATION  Current Outpatient Medications:    acyclovir (ZOVIRAX) 400 MG tablet, Take 400 mg by mouth 2 (two) times daily., Disp: , Rfl:    amLODipine (NORVASC) 5 MG tablet, Take 5 mg by mouth daily., Disp: , Rfl:    Ascorbic Acid (VITAMIN C) 1000 MG tablet, Take 1,000 mg by mouth daily., Disp: , Rfl:    atorvastatin (LIPITOR) 20 MG tablet, Take 20 mg by mouth daily., Disp: , Rfl:    diphenhydrAMINE (BENADRYL) 25 MG tablet, Take 25 mg by mouth every 6 (six) hours as needed., Disp: , Rfl:    fluticasone (FLONASE) 50 MCG/ACT nasal spray, Place 2 sprays into both nostrils daily. , Disp: , Rfl:    folic acid (FOLVITE) 540 MCG tablet, Take 400 mcg by mouth daily. , Disp: , Rfl:    gabapentin (NEURONTIN) 300 MG capsule, TAKE 1 CAPSULE BY MOUTH  TWICE DAILY  (Patient taking differently: '600mg'$  in AM and PM, '300mg'$  at 1200 if needed.), Disp: 180 capsule, Rfl: 3   lactulose (CHRONULAC) 10 GM/15ML solution, Take 15 mLs (10 g total) by mouth 2 (two) times daily., Disp: 236 mL, Rfl: 0   letrozole (FEMARA) 2.5 MG tablet, TAKE 1 TABLET BY MOUTH  DAILY, Disp: 100 tablet, Rfl: 2   lisinopril (ZESTRIL) 2.5 MG tablet, Take 2.5 mg by mouth at bedtime., Disp: , Rfl:    loratadine (CLARITIN) 10 MG tablet, Take 10 mg by mouth daily., Disp: , Rfl:    metFORMIN (GLUCOPHAGE-XR) 500 MG 24 hr tablet, 1,000 mg 2 (two) times daily., Disp: , Rfl:    montelukast (SINGULAIR) 10 MG tablet, Take 10 mg by mouth at bedtime., Disp: , Rfl:    Multiple Vitamin (MULTIVITAMIN) capsule, Take 1 capsule by mouth daily., Disp: , Rfl:    omeprazole (PRILOSEC) 20 MG capsule, Take 20 mg by mouth daily., Disp: , Rfl:    ondansetron (ZOFRAN) 8 MG tablet, Take 1 tablet (8 mg total) by mouth 2 (two) times daily as needed for refractory nausea / vomiting., Disp: 30 tablet, Rfl: 3   PARoxetine (PAXIL) 10 MG tablet, Take 10 mg by mouth at bedtime., Disp: , Rfl:    potassium chloride (MICRO-K) 10 MEQ CR capsule, Take 10 mEq by mouth 2 (two) times daily., Disp: , Rfl:    prochlorperazine (COMPAZINE) 10 MG tablet, Take 1 tablet (10 mg total) by mouth every 6 (six) hours as needed (Nausea or vomiting)., Disp: 30 tablet, Rfl: 3   tiZANidine (ZANAFLEX) 4 MG tablet, Take 4 mg by mouth 3 (three) times daily., Disp: , Rfl:    topiramate (TOPAMAX) 100 MG tablet, Take 100 mg by mouth daily., Disp: , Rfl:    vitamin B-12 (CYANOCOBALAMIN) 500 MCG tablet, Take 500 mcg by mouth daily. , Disp: , Rfl:    Objective:   Vitals:   04/29/22 1036  BP: (!) 152/96  Pulse: 88  SpO2: 94%    Physical Exam Vitals and nursing note reviewed.  Constitutional:      Appearance: Normal appearance.  HENT:     Head: Normocephalic and atraumatic.  Cardiovascular:     Rate and Rhythm: Normal rate.     Pulses: Normal pulses.   Pulmonary:     Effort: Pulmonary effort is normal.  Abdominal:     Palpations: Abdomen is soft.  Musculoskeletal:        General: Swelling present. No deformity.  Skin:    General: Skin is warm.     Capillary Refill: Capillary refill takes less than  2 seconds.     Coloration: Skin is not jaundiced.     Findings: No bruising or lesion.  Neurological:     Mental Status: She is alert and oriented to person, place, and time.  Psychiatric:        Mood and Affect: Mood normal.        Behavior: Behavior normal.        Thought Content: Thought content normal.        Judgment: Judgment normal.     Assessment & Plan:  Morbid (severe) obesity due to excess calories ([facility])  Malignant neoplasm of upper-inner quadrant of left breast in female, estrogen receptor positive ([place])  Type 2 diabetes mellitus with complication, without long-term current use of insulin ([facility])  Postoperative breast asymmetry  The patient is a good candidate for a right breast reduction for symmetry.  She could undergo a left breast reduction as well however the risks are much higher because of her diabetes and her radiation.  She could lose part of her breast, the nipple or even the areola.  I would like her to think about it and we can do a telemetry visit in the next few weeks and talk about it and see what she would like to do.  I think the risks are quite high for skin breakdown on the left side.  Pictures were obtained of the patient and placed in the chart with the patient's or guardian's permission.   [place], DO

## 2022-05-03 ENCOUNTER — Ambulatory Visit: Payer: Medicare Other | Admitting: Physical Therapy

## 2022-05-05 ENCOUNTER — Encounter: Payer: Medicare Other | Admitting: Physical Therapy

## 2022-05-10 ENCOUNTER — Encounter: Payer: Medicare Other | Admitting: Physical Therapy

## 2022-05-12 ENCOUNTER — Encounter: Payer: Medicare Other | Admitting: Rehabilitation

## 2022-05-17 ENCOUNTER — Ambulatory Visit (INDEPENDENT_AMBULATORY_CARE_PROVIDER_SITE_OTHER): Payer: Medicare Other | Admitting: Plastic Surgery

## 2022-05-17 ENCOUNTER — Encounter: Payer: Self-pay | Admitting: Plastic Surgery

## 2022-05-17 ENCOUNTER — Encounter: Payer: Medicare Other | Admitting: Physical Therapy

## 2022-05-17 DIAGNOSIS — N651 Disproportion of reconstructed breast: Secondary | ICD-10-CM | POA: Diagnosis not present

## 2022-05-17 DIAGNOSIS — C50212 Malignant neoplasm of upper-inner quadrant of left female breast: Secondary | ICD-10-CM

## 2022-05-17 DIAGNOSIS — Z923 Personal history of irradiation: Secondary | ICD-10-CM

## 2022-05-17 DIAGNOSIS — Z853 Personal history of malignant neoplasm of breast: Secondary | ICD-10-CM | POA: Diagnosis not present

## 2022-05-17 DIAGNOSIS — Z170 Estrogen receptor positive status [ER+]: Secondary | ICD-10-CM

## 2022-05-17 DIAGNOSIS — N6489 Other specified disorders of breast: Secondary | ICD-10-CM

## 2022-05-17 NOTE — Progress Notes (Signed)
   Subjective:    Patient ID: Emma Casey, female    DOB: 11/09/1960, 61 y.o.   MRN: [id number]  The patient is a 61 year old female joining me by phone for further discussion about her breasts.  She had a mastectomy of the left breast that was partial by Dr. [name] 3 years ago.  She then had radiation which is ended 2-1/2 years ago.  She is diabetic and her last hemoglobin A1c was 7.5.  She has an updated mammogram which was negative.  She has some severe asymmetry of her breasts with the left being smaller.  She finds it very difficult to fit into one bra because of the asymmetry.  We had a long discussion about the risks of her left breast not healing with surgery because of the radiation.  Today she says she has thought it over and wants to take the risk and have both breasts reduced.  She understands this could mean loss of her left nipple areola.  Review of Systems  Constitutional: Negative.   Eyes: Negative.   Respiratory: Negative.    Cardiovascular: Negative.   Gastrointestinal: Negative.   Endocrine: Negative.   Genitourinary: Negative.   Musculoskeletal: Negative.        Objective:   Physical Exam      Assessment & Plan:   I connected with  QUINTANA CANELO on 05/17/22 by phone and verified that I am speaking with the correct person using two identifiers. The patient was at home and I was at the office.  We spent 5 minutes in discussion.  The patient would like to move ahead with bilateral breast reduction.  She will need a hemoglobin A1c and be 6 weeks smoke-free prior to the surgery.  Plan for bilateral breast reduction with possible liposuction.  She will likely require amputation technique.    I discussed the limitations of evaluation and management by telemedicine. The patient expressed understanding and agreed to proceed.

## 2022-05-19 ENCOUNTER — Encounter: Payer: Medicare Other | Admitting: Physical Therapy

## 2022-05-24 ENCOUNTER — Encounter: Payer: Medicare Other | Admitting: Physical Therapy

## 2022-05-26 ENCOUNTER — Encounter: Payer: Medicare Other | Admitting: Physical Therapy

## 2022-06-09 ENCOUNTER — Telehealth: Payer: Self-pay | Admitting: Neurology

## 2022-06-09 NOTE — Telephone Encounter (Signed)
Pt was scheduled for her initial BIPAP on (08/11/22) Pt was informed to bring machine and power cord to the appointment.   DME in snapshot between dates are 07/20/22-08/18/22

## 2022-06-10 ENCOUNTER — Other Ambulatory Visit: Payer: Self-pay

## 2022-06-13 ENCOUNTER — Ambulatory Visit: Payer: Medicare Other | Admitting: Neurology

## 2022-06-20 ENCOUNTER — Other Ambulatory Visit: Payer: Self-pay

## 2022-06-20 ENCOUNTER — Encounter: Payer: Self-pay | Admitting: Nurse Practitioner

## 2022-06-20 ENCOUNTER — Telehealth: Payer: Self-pay | Admitting: Plastic Surgery

## 2022-06-20 NOTE — Telephone Encounter (Signed)
Returned pt call asking about when her appt is to be scheduled.

## 2022-06-21 ENCOUNTER — Telehealth: Payer: Self-pay

## 2022-06-21 NOTE — Telephone Encounter (Signed)
Returned patients call in regards to scheduling her surgery. Confirmed she has not stopped smoking. Advised her to call us back when she is 6 week tobacco free, and we will order a nicotine test. Also, she will need to have her A1C checked 6 weeks from her last lab done on 10/2 which was 7.3 at that time.  Suggested to work on cutting more carbs.  She will call our office back once our request has been completed.

## 2022-07-24 ENCOUNTER — Other Ambulatory Visit: Payer: Self-pay | Admitting: Nurse Practitioner

## 2022-07-24 DIAGNOSIS — Z170 Estrogen receptor positive status [ER+]: Secondary | ICD-10-CM

## 2022-07-24 NOTE — Progress Notes (Unsigned)
[place]   [phone] [phone]   Clinic Follow up Note   Patient Care Team: Stana Bunting, MD as PCP - General (Family Medicine) Mauro Kaufmann, RN as Oncology Nurse Navigator Rockwell Germany, RN as Oncology Nurse Navigator Fanny Skates, MD as Consulting Physician (General Surgery) Truitt Merle, MD as Consulting Physician (Hematology) Kyung Rudd, MD as Consulting Physician (Radiation Oncology) Alla Feeling, NP as Nurse Practitioner (Nurse Practitioner) 07/25/2022  CHIEF COMPLAINT: Follow up left breast cancer   SUMMARY OF ONCOLOGIC HISTORY: Oncology History Overview Note  Cancer Staging Malignant neoplasm of upper-inner quadrant of left breast in female, estrogen receptor positive ([place]) Staging form: Breast, AJCC 8th Edition - Clinical stage from 12/17/2018: Stage Unknown (cTX, cN0, cM0, G2, ER+, PR+, HER2-) - Signed by Truitt Merle, MD on 12/25/2018 - Pathologic stage from 01/08/2019: Stage IA (pT1c, pN0, cM0, G3, ER+, PR+, HER2-) - Signed by Truitt Merle, MD on 01/22/2019     Malignant neoplasm of upper-inner quadrant of left breast in female, estrogen receptor positive ([place])  11/28/2018 Mammogram   Diagnostic Mammogram 11/28/18 IMPRESSION:  Suspcious mass in upper inner quadrant of the left breast  at posterior depth.    12/17/2018 Initial Biopsy   Diagnosis 12/17/18 Breast, left, needle core biopsy, upper inner quadrant - INVASIVE DUCTAL CARCINOMA, SEE COMMENT. - DUCTAL CARCINOMA IN SITU.   12/17/2018 Cancer Staging   Staging form: Breast, AJCC 8th Edition - Clinical stage from 12/17/2018: Stage Unknown (cTX, cN0, cM0, G2, ER+, PR+, HER2-) - Signed by Truitt Merle, MD on 12/25/2018   12/20/2018 Initial Diagnosis   Malignant neoplasm of upper-inner quadrant of left breast in female, estrogen receptor positive ([place])   12/20/2018 Receptors her2   Results: IMMUNOHISTOCHEMICAL AND MORPHOMETRIC ANALYSIS PERFORMED MANUALLY The tumor cells are  NEGATIVE for Her2 (1+). Estrogen Receptor: 95%, POSITIVE, STRONG STAINING INTENSITY Progesterone Receptor: 30%, POSITIVE, STRONG STAINING INTENSITY Proliferation Marker Ki67: 20%   01/08/2019 Surgery   LEFT BREAST LUMPECTOMY WITH RADIOACTIVE SEED AND LEFT AXILLARY DEEP SENTINEL LYMPH NODE BIOPSY WITH BLUE DYE INJECTION by Dr [name] 01/08/19    01/08/2019 Pathology Results   Diagnosis 01/08/19 1. Breast, lumpectomy, left - INVASIVE DUCTAL CARCINOMA, GRADE III/III, SPANNING 1.8 CM. - DUCTAL CARCINOMA IN SITU, HIGH GRADE. - DUCTAL CARCINOMA IN SITU IS FOCALLY PRESENT AT THE ANTERIOR MARGIN OF SPECIMEN #1. - DUCTAL CARCINOMA IN SITU IS FOCALLY LESS THAN 0.1 CM FROM THE INFERIOR MARGIN OF SPECIMEN # 1. - SEE ONCOLOGY TABLE BELOW. 2. Breast, excision, left medial margin - DUCTAL CARCINOMA IN SITU, FOCALLY PRESENT AT THE MEDIAL MARGIN OF SPECIMEN # 2. 3. Breast, excision, left posterior margin - BENIGN BREAST PARENCHYMA. - THERE IS NO EVIDENCE OF MALIGNANCY. - SEE COMMENT. 4. Lymph node, sentinel, biopsy, left axillary - THERE IS NO EVIDENCE OF CARCINOMA IN 1 OF 1 LYMPH NODE (0/1). 5. Lymph node, sentinel, biopsy, left axillary - THERE IS NO EVIDENCE OF CARCINOMA IN 1 OF 1 LYMPH NODE (0/1). 6. Lymph node, sentinel, biopsy, left axillary - THERE IS NO EVIDENCE OF CARCINOMA IN 1 OF 1 LYMPH NODE (0/1)   01/08/2019 Oncotype testing   recurrence score of 27 with risk of recurrence 16% with Tamoxifen or AI alone. There is a benefit of chemotherapy >15%    01/08/2019 Cancer Staging   Staging form: Breast, AJCC 8th Edition - Pathologic stage from 01/08/2019: Stage IA (pT1c, pN0, cM0, G3, ER+, PR+, HER2-) - Signed by Truitt Merle, MD on 01/22/2019   01/29/2019  Genetic Testing   Negative genetic testing on the common hereditary cancer panel.  The Common Hereditary Gene Panel offered by [facility] includes sequencing and/or deletion duplication testing of the following 48 genes: APC, ATM, AXIN2, BARD1, BMPR1A,  BRCA1, BRCA2, BRIP1, CDH1, CDK4, CDKN2A (p14ARF), CDKN2A (p16INK4a), CHEK2, CTNNA1, DICER1, EPCAM (Deletion/duplication testing only), GREM1 (promoter region deletion/duplication testing only), KIT, MEN1, MLH1, MSH2, MSH3, MSH6, MUTYH, NBN, NF1, NHTL1, PALB2, PDGFRA, PMS2, POLD1, POLE, PTEN, RAD50, RAD51C, RAD51D, RNF43, SDHB, SDHC, SDHD, SMAD4, SMARCA4. STK11, TP53, TSC1, TSC2, and VHL.  The following genes were evaluated for sequence changes only: SDHA and HOXB13 c.251G>A variant only. The report date is January 29, 2019.   03/01/2019 Surgery   RE-EXCISION OF LEFT BREAST LUMPECTOMY MULITPLE MARGINS by Dr. [name]  Diagnosis 1. Breast, excision, re-excision left anterior margin - BENIGN BREAST PARENCHYMA WITH PREVIOUS PROCEDURE-RELATED CHANGES - NEGATIVE FOR IN SITU OR INVASIVE CARCINOMA 2. 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 SEE NOTE - DCIS IS FOCALLY LESS THAN 1 MM FROM NEW INFERIOR MARGIN - NEGATIVE FOR INVASIVE CARCINOMA - PREVIOUS PROCEDURE-RELATED CHANGES 3. Breast, excision, re-excision left medial margin - FOCAL DUCTAL CARCINOMA IN SITU (DCIS), 0.2 CM. SEE NOTE - DCIS IS 1.5 MM FROM NEW MEDIAL MARGIN - NEGATIVE FOR INVASIVE CARCINOMA - PREVIOUS PROCEDURE-RELATED CHANGES 4. Breast, excision, re-excision left final medial argin - BENIGN BREAST PARENCHYMA WITH MILD FOCAL PREVIOUS PROCEDURE-RELATED CHANGES - NEGATIVE FOR IN SITU OR INVASIVE CARCINOMA    03/22/2019 - 05/24/2019 Chemotherapy   TC q3weeks for 4 cycles starting 03/22/19 and completed on 05/24/19    06/24/2019 - 08/09/2019 Radiation Therapy   Radiation therapy with Dr. Lisbeth Renshaw 06/24/19-08/09/19   08/2019 -  Anti-estrogen oral therapy   Letrozole 2.42m starting 08/2019   10/23/2019 Survivorship   SCP delivered by LCira Rue NP      CURRENT THERAPY: Letrozole, starting 08/2019  INTERVAL HISTORY: [name] for follow up as scheduled, last seen by me  01/20/22. Mammogram 03/16/22 showed breast density cat C, no evidence of malignancy.  She continues to have bilateral breast pain and is planning to proceed with a left by Dr. DMarla Roe  She quit smoking 11/8.  She recently had bronchitis and is still recovering.  She also has 3 new cysts in the left palm and recently felt a nodule on her right upper arm.  Neuropathy is also a little worse, takes gabapentin 300 mg twice a day and occasionally an extra midday if she has a flare.  This helps.  She had urinary urgency/incontinence prior to breast cancer diagnosis which is worsened in the last year, now requiring depends.  Denies dysuria, fever, hematuria.  She denies bowel incontinence or saddle paresthesia.  Denies new bone pain.  Continues letrozole, no other side effects.  All other systems were reviewed with the patient and are negative.  MEDICAL HISTORY:  Past Medical History:  Diagnosis Date   Anxiety    Arthritis    right hand   Back pain    [place]ast cancer [place]    Bronchitis    [place]   left breast ca   Chronic fatigue syndrome    Constipation    Depression    Diabetes mellitus without complication (H[facility])    Family history of breast cancer    GERD (gastroesophageal reflux disease)    Headache    Heart murmur    Herpes 1&2    Hyperlipidemia    Hypertension  Joint pain    Leg fracture, left    as a child   Lymphedema    Multiple food allergies    OSA on CPAP    has CPAP but has not used in over a month   Personal history of chemotherapy    Personal history of radiation therapy    Prediabetes    Shortness of breath     SURGICAL HISTORY: Past Surgical History:  Procedure Laterality Date   ABDOMINAL HYSTERECTOMY     BREAST LUMPECTOMY WITH RADIOACTIVE SEED AND SENTINEL LYMPH NODE BIOPSY Left 01/08/2019   Procedure: LEFT BREAST LUMPECTOMY WITH RADIOACTIVE SEED AND LEFT AXILLARY DEEP SENTINEL LYMPH NODE BIOPSY WITH BLUE DYE INJECTION;  Surgeon: Fanny Skates,  MD;  Location: [facility];  Service: General;  Laterality: Left;   COLONOSCOPY N/A 09/02/2015   Procedure: COLONOSCOPY;  Surgeon: Danie Binder, MD;  [facility];  Service: Endoscopy;  Laterality: N/A;  11:15 Am   EYE SURGERY Right    cataract   HAND SURGERY Right    broken bone   MASS EXCISION Left 10/01/2021   Procedure: EXCISION MASS LEFT PALM;  Surgeon: Daryll Brod, MD;  Location: [facility];  Service: Orthopedics;  Laterality: Left;   PORT-A-CATH REMOVAL Right 08/21/2019   Procedure: REMOVAL PORT-A-CATH;  Surgeon: Fanny Skates, MD;  Location: [facility];  Service: General;  Laterality: Right;   PORTACATH PLACEMENT N/A 03/01/2019   Procedure: INSERTION PORT-A-CATH;  Surgeon: Fanny Skates, MD;  Location: [facility];  Service: General;  Laterality: N/A;   RE-EXCISION OF BREAST LUMPECTOMY Left 03/01/2019   Procedure: RE-EXCISION OF LEFT BREAST LUMPECTOMY MULITPLE MARGINS;  Surgeon: Fanny Skates, MD;  Location: [facility];  Service: General;  Laterality: Left;   TUBAL LIGATION      I have reviewed the social history and family history with the patient and they are unchanged from previous note.  ALLERGIES:  is allergic to asa [aspirin], nsaids, and penicillins.  MEDICATIONS:  Current Outpatient Medications  Medication Sig Dispense Refill   acyclovir (ZOVIRAX) 400 MG tablet Take 400 mg by mouth 2 (two) times daily.     amLODipine (NORVASC) 5 MG tablet Take 5 mg by mouth daily.     Ascorbic Acid (VITAMIN C) 1000 MG tablet Take 1,000 mg by mouth daily.     atorvastatin (LIPITOR) 20 MG tablet Take 20 mg by mouth daily.     diphenhydrAMINE (BENADRYL) 25 MG tablet Take 25 mg by mouth every 6 (six) hours as needed.     fluticasone (FLONASE) 50 MCG/ACT nasal spray Place 2 sprays into both nostrils daily.      folic acid (FOLVITE) 417 MCG tablet Take 400 mcg by mouth daily.      gabapentin (NEURONTIN) 300 MG capsule TAKE 1 CAPSULE BY MOUTH   TWICE DAILY (Patient taking differently: 662m in AM and PM, 3063mat 1200 if needed.) 180 capsule 3   lactulose (CHRONULAC) 10 GM/15ML solution Take 15 mLs (10 g total) by mouth 2 (two) times daily. 236 mL 0   letrozole (FEMARA) 2.5 MG tablet TAKE 1 TABLET BY MOUTH  DAILY 100 tablet 2   lisinopril (ZESTRIL) 2.5 MG tablet Take 2.5 mg by mouth at bedtime.     loratadine (CLARITIN) 10 MG tablet Take 10 mg by mouth daily.     metFORMIN (GLUCOPHAGE-XR) 500 MG 24 hr tablet 1,000 mg 2 (two) times daily.     montelukast (SINGULAIR) 10 MG tablet Take  10 mg by mouth at bedtime.     Multiple Vitamin (MULTIVITAMIN) capsule Take 1 capsule by mouth daily.     omeprazole (PRILOSEC) 20 MG capsule Take 20 mg by mouth daily.     PARoxetine (PAXIL) 10 MG tablet Take 10 mg by mouth at bedtime.     potassium chloride (MICRO-K) 10 MEQ CR capsule Take 10 mEq by mouth 2 (two) times daily.     tiZANidine (ZANAFLEX) 4 MG tablet Take 4 mg by mouth 3 (three) times daily.     topiramate (TOPAMAX) 100 MG tablet Take 100 mg by mouth daily.     vitamin B-12 (CYANOCOBALAMIN) 500 MCG tablet Take 500 mcg by mouth daily.      prochlorperazine (COMPAZINE) 10 MG tablet Take 1 tablet (10 mg total) by mouth every 6 (six) hours as needed (Nausea or vomiting). 30 tablet 3   No current facility-administered medications for this visit.    PHYSICAL EXAMINATION: ECOG PERFORMANCE STATUS: 1 - Symptomatic but completely ambulatory  Vitals:   07/25/22 1034  BP: 130/82  Pulse: 90  Resp: 17  Temp: 98.4 F (36.9 C)  SpO2: 97%   Filed Weights   07/25/22 1034  Weight: 246 lb 12.8 oz (111.9 kg)    GENERAL:alert, no distress and comfortable SKIN: no rash  EYES: sclera clear NECK: Without mass LYMPH:  no palpable cervical or supraclavicular lymphadenopathy LUNGS: clear with normal breathing effort HEART: regular rate & rhythm, no lower extremity edema ABDOMEN:abdomen soft, non-tender and normal bowel sounds Musculoskeletal:  Small round 1 cm superficial nodule to the right upper arm NEURO: alert & oriented x 3 with fluent speech, no focal motor/sensory deficits Breast exam: Symmetrical without nipple discharge or inversion.  S/p left lumpectomy, incision completely healed with minimal scar tissue.  No palpable mass or nodularity in either breast or axilla that I could appreciate  LABORATORY DATA:  I have reviewed the data as listed    Latest Ref Rng & Units 07/25/2022    9:36 AM 01/20/2022    9:08 AM 07/23/2021    2:30 PM  CBC  WBC 4.0 - 10.5 K/uL 8.3  7.7  8.9   Hemoglobin 12.0 - 15.0 g/dL 13.2  12.7  13.2   Hematocrit 36.0 - 46.0 % 41.4  41.4  43.1   Platelets 150 - 400 K/uL 269  257  275         Latest Ref Rng & Units 07/25/2022    9:36 AM 01/20/2022    9:08 AM 09/28/2021    9:55 AM  CMP  Glucose 70 - 99 mg/dL 178  228  121   BUN 8 - 23 mg/dL 12  11  <5   Creatinine 0.44 - 1.00 mg/dL 1.27  1.26  1.26   Sodium 135 - 145 mmol/L 141  142  141   Potassium 3.5 - 5.1 mmol/L 3.5  4.4  4.3   Chloride 98 - 111 mmol/L 105  108  105   CO2 22 - 32 mmol/L _0 Calcium 8.9 - 10.3 mg/dL 9.9  9.7  9.4   Total Protein 6.5 - 8.1 g/dL 7.2  6.7    Total Bilirubin 0.3 - 1.2 mg/dL 0.6  0.6    Alkaline Phos 38 - 126 U/L 74  71    AST 15 - 41 U/L 10  11    ALT 0 - 44 U/L 7  7        RADIOGRAPHIC  STUDIES: I have personally reviewed the radiological images as listed and agreed with the findings in the report. No results found.   ASSESSMENT & PLAN: Emma Casey is a 61 y.o. female with    1. Malignant neoplasm of upper-inner quadrant of left breast, pT1cN0, stage I, ER/PR+,  HER2-, Grade III, Oncotype RS 27 -Diagnosed in 11/2018. S/p left breast lumpectomy and SLN biopsy on 01/08/19. Given positive margins she underwent left re-excision on 03/01/19. Oncotype showed RS 27, high risk.  -she completed 4 cycles of adjuvant chemotherapy with TC in 05/2019 and adjuvant Radiation.  -she started antiestrogen therapy  with letrozole in 08/2019. she is tolerating well  -most recent MM on 03/16/22 was benign -Emma Casey is clinically doing well.  Tolerating letrozole.  Breast exam is benign, labs are stable.  Overall no clinical concern for breast cancer recurrence. -She is planning breast lift by Dr. Marla Roe -Continue breast cancer surveillance and letrozole -Follow-up in 6 months, or sooner if needed  2.  Urinary urgency and incontinence -Given prior to breast cancer diagnosis, worsened in the past year requiring depends now -no back pain, bowel incontinence, or paresthesia -will r/o UTI; if negative possibly related to anti-estrogen -I recommend pelvic floor PT, she will think about it   3. Osteopenia -DEXA 12/02/2019 showed lowest T score -2.1, FRAX showed 11.1% risk of major osteoporotic fracture and 1.1% risk of hip fracture in the next 10 years  -DEXA on 04/13/21 showed improvement in osteopenia (T-score -1.9). -she is taking daily calcium and vit D -Difficulty with weightbearing exercise due to knee pain   4. HTN, DM with hyperglycemia, Obesity -On Amlodipine, lisinopril, metformin 3 tabs once daily.   -Dr. Burr Medico previously referred her to [facility] weight management clinic -Ongoing work to control weight and lower her A1c; previously recommended healthy weight loss clinic and aqua aerobics    5. CKD stage III -secondary to DM and HTN. Will monitor  -Overall stable   6. Smoking Cessation  -She smoked for 10 years before she quit. She restarted after recent cancer diagnosis  -She was able to quit smoking 06/29/2022   7. Genetic Testing was negative for pathogenetic mutations   8. B/l leg pain, neuropathy of feet, G1 -Likely secondary to chemo and DM -Overall improved some, takes gabapentin 2-3 times daily   Plan: -Recent mammogram and today's labs reviewed -Continue breast cancer surveillance and letrozole -UA and culture today, will call with results -Discussed referral to pelvic  floor PT, patient will think about it -Follow-up in 6 months, or sooner if needed    Orders Placed This Encounter  Procedures   Urine Culture    Standing Status:   Future    Number of Occurrences:   1    Standing Expiration Date:   07/25/2023   Urinalysis, Complete w Microscopic    Standing Status:   Future    Number of Occurrences:   1    Standing Expiration Date:   07/26/2023   All questions were answered. The patient knows to call the clinic with any problems, questions or concerns. No barriers to learning was detected. I spent 20 minutes counseling the patient face to face. The total time spent in the appointment was 30 minutes and more than 50% was on counseling and review of test results     Alla Feeling, NP 07/25/22

## 2022-07-25 ENCOUNTER — Inpatient Hospital Stay: Payer: Medicare Other | Attending: Nurse Practitioner | Admitting: Nurse Practitioner

## 2022-07-25 ENCOUNTER — Inpatient Hospital Stay: Payer: Medicare Other

## 2022-07-25 ENCOUNTER — Telehealth: Payer: Self-pay | Admitting: Plastic Surgery

## 2022-07-25 ENCOUNTER — Encounter: Payer: Self-pay | Admitting: Nurse Practitioner

## 2022-07-25 VITALS — BP 130/82 | HR 90 | Temp 98.4°F | Resp 17 | Wt 246.8 lb

## 2022-07-25 DIAGNOSIS — N183 Chronic kidney disease, stage 3 unspecified: Secondary | ICD-10-CM | POA: Insufficient documentation

## 2022-07-25 DIAGNOSIS — E1140 Type 2 diabetes mellitus with diabetic neuropathy, unspecified: Secondary | ICD-10-CM | POA: Diagnosis not present

## 2022-07-25 DIAGNOSIS — E2839 Other primary ovarian failure: Secondary | ICD-10-CM

## 2022-07-25 DIAGNOSIS — M8580 Other specified disorders of bone density and structure, unspecified site: Secondary | ICD-10-CM | POA: Diagnosis not present

## 2022-07-25 DIAGNOSIS — M79605 Pain in left leg: Secondary | ICD-10-CM | POA: Diagnosis not present

## 2022-07-25 DIAGNOSIS — C50212 Malignant neoplasm of upper-inner quadrant of left female breast: Secondary | ICD-10-CM | POA: Insufficient documentation

## 2022-07-25 DIAGNOSIS — Z79811 Long term (current) use of aromatase inhibitors: Secondary | ICD-10-CM | POA: Diagnosis not present

## 2022-07-25 DIAGNOSIS — I129 Hypertensive chronic kidney disease with stage 1 through stage 4 chronic kidney disease, or unspecified chronic kidney disease: Secondary | ICD-10-CM | POA: Diagnosis not present

## 2022-07-25 DIAGNOSIS — Z170 Estrogen receptor positive status [ER+]: Secondary | ICD-10-CM | POA: Diagnosis not present

## 2022-07-25 DIAGNOSIS — E1122 Type 2 diabetes mellitus with diabetic chronic kidney disease: Secondary | ICD-10-CM | POA: Diagnosis not present

## 2022-07-25 DIAGNOSIS — Z90710 Acquired absence of both cervix and uterus: Secondary | ICD-10-CM | POA: Insufficient documentation

## 2022-07-25 DIAGNOSIS — M79604 Pain in right leg: Secondary | ICD-10-CM | POA: Insufficient documentation

## 2022-07-25 DIAGNOSIS — E669 Obesity, unspecified: Secondary | ICD-10-CM | POA: Diagnosis not present

## 2022-07-25 DIAGNOSIS — N3941 Urge incontinence: Secondary | ICD-10-CM | POA: Diagnosis not present

## 2022-07-25 DIAGNOSIS — Z87891 Personal history of nicotine dependence: Secondary | ICD-10-CM | POA: Diagnosis not present

## 2022-07-25 LAB — CMP (CANCER CENTER ONLY)
ALT: 7 U/L (ref 0–44)
AST: 10 U/L — ABNORMAL LOW (ref 15–41)
Albumin: 4.4 g/dL (ref 3.5–5.0)
Alkaline Phosphatase: 74 U/L (ref 38–126)
Anion gap: 7 (ref 5–15)
BUN: 12 mg/dL (ref 8–23)
CO2: 29 mmol/L (ref 22–32)
Calcium: 9.9 mg/dL (ref 8.9–10.3)
Chloride: 105 mmol/L (ref 98–111)
Creatinine: 1.27 mg/dL — ABNORMAL HIGH (ref 0.44–1.00)
GFR, Estimated: 48 mL/min — ABNORMAL LOW (ref >60)
Glucose, Bld: 178 mg/dL — ABNORMAL HIGH (ref 70–99)
Potassium: 3.5 mmol/L (ref 3.5–5.1)
Sodium: 141 mmol/L (ref 135–145)
Total Bilirubin: 0.6 mg/dL (ref 0.3–1.2)
Total Protein: 7.2 g/dL (ref 6.5–8.1)

## 2022-07-25 LAB — URINALYSIS, COMPLETE (UACMP) WITH MICROSCOPIC
Bilirubin Urine: NEGATIVE
Glucose, UA: NEGATIVE mg/dL
Hgb urine dipstick: NEGATIVE
Ketones, ur: NEGATIVE mg/dL
Leukocytes,Ua: NEGATIVE
Nitrite: NEGATIVE
Protein, ur: NEGATIVE mg/dL
Specific Gravity, Urine: 1.018 (ref 1.005–1.030)
pH: 5 (ref 5.0–8.0)

## 2022-07-25 LAB — CBC WITH DIFFERENTIAL (CANCER CENTER ONLY)
Abs Immature Granulocytes: 0.03 10*3/uL (ref 0.00–0.07)
Basophils Absolute: 0 10*3/uL (ref 0.0–0.1)
Basophils Relative: 1 %
Eosinophils Absolute: 0.1 10*3/uL (ref 0.0–0.5)
Eosinophils Relative: 1 %
HCT: 41.4 % (ref 36.0–46.0)
Hemoglobin: 13.2 g/dL (ref 12.0–15.0)
Immature Granulocytes: 0 %
Lymphocytes Relative: 26 %
Lymphs Abs: 2.2 10*3/uL (ref 0.7–4.0)
MCH: 29.8 pg (ref 26.0–34.0)
MCHC: 31.9 g/dL (ref 30.0–36.0)
MCV: 93.5 fL (ref 80.0–100.0)
Monocytes Absolute: 0.3 10*3/uL (ref 0.1–1.0)
Monocytes Relative: 4 %
Neutro Abs: 5.6 10*3/uL (ref 1.7–7.7)
Neutrophils Relative %: 68 %
Platelet Count: 269 10*3/uL (ref 150–400)
RBC: 4.43 MIL/uL (ref 3.87–5.11)
RDW: 14 % (ref 11.5–15.5)
WBC Count: 8.3 10*3/uL (ref 4.0–10.5)
nRBC: 0 % (ref 0.0–0.2)

## 2022-07-25 NOTE — Telephone Encounter (Signed)
Pt called in and no cigarettes since Jun 29, 2022 and her primary said her A!C does not need to be checked it's been the same since last six months.  Dr. Marla Roe wanted her to come in 6 weeks after stopping smoking to take smoking test and would like a follow up call from PA to confirm.

## 2022-07-26 ENCOUNTER — Ambulatory Visit (INDEPENDENT_AMBULATORY_CARE_PROVIDER_SITE_OTHER): Payer: Medicare Other | Admitting: Surgical

## 2022-07-26 ENCOUNTER — Encounter: Payer: Self-pay | Admitting: Surgical

## 2022-07-26 VITALS — BP 121/79 | HR 86 | Ht 65.0 in | Wt 245.0 lb

## 2022-07-26 DIAGNOSIS — Z923 Personal history of irradiation: Secondary | ICD-10-CM | POA: Diagnosis not present

## 2022-07-26 DIAGNOSIS — N651 Disproportion of reconstructed breast: Secondary | ICD-10-CM

## 2022-07-26 DIAGNOSIS — N6489 Other specified disorders of breast: Secondary | ICD-10-CM

## 2022-07-26 DIAGNOSIS — C50212 Malignant neoplasm of upper-inner quadrant of left female breast: Secondary | ICD-10-CM

## 2022-07-26 DIAGNOSIS — F17211 Nicotine dependence, cigarettes, in remission: Secondary | ICD-10-CM

## 2022-07-26 DIAGNOSIS — E118 Type 2 diabetes mellitus with unspecified complications: Secondary | ICD-10-CM

## 2022-07-26 DIAGNOSIS — F17200 Nicotine dependence, unspecified, uncomplicated: Secondary | ICD-10-CM

## 2022-07-26 LAB — URINE CULTURE: Culture: <10000 — AB

## 2022-07-26 NOTE — Progress Notes (Signed)
   Referring Provider No referring provider defined for this encounter.   CC:  Chief Complaint  Patient presents with   Follow-up      Emma Casey is an 61 y.o. female.  HPI: Patient is a 61 year old female who presents today to discuss surgical scheduling for bilateral breast reduction.  She was previously smoking cigarettes, but was informed that she needed to stop for at least 6 weeks prior to surgery and that she would require a nicotine test.    She reports that she stopped smoking November 8, she has been doing well with this and has been approximately 4 weeks.  She reports that she went to see her PCP to discuss her diabetes and he stated that she did not need any additional testing as her A1c has been stable.  I did check her labs and her last A1c was 7.3 2 months ago.  Patient is interested in pursuing surgical intervention for bilateral breast reduction for improved symmetry after left breast lumpectomy and radiation.  She does report that she is recovering from some bronchitis, reports that she overall feels much better than she did  Review of Systems General: No fevers or chills  Physical Exam    07/26/2022    9:49 AM 07/25/2022   10:34 AM 04/29/2022   10:36 AM  Vitals with BMI  Height '5\' 5"'$   '5\' 5"'$   Weight 245 lbs 246 lbs 13 oz 249 lbs 3 oz  BMI 41.74  08.14  Systolic 481 856 314  Diastolic 79 82 96  Pulse 86 90 88    General:  No acute distress,  Alert and oriented, Non-Toxic, Normal speech and affect   Assessment/Plan Patient is a 61 year old female with breast asymmetry due to left breast lumpectomy and radiation.  She discussed with Dr. Marla Roe bilateral breast reduction, however they discussed need for tobacco cessation and negative nicotine test prior to surgery.  She is here today to discuss this.  Patient provided with nicotine cotinine test for her to get at [facility] in approximately 2 weeks. We also discussed that she may need to repeat her A1c before  surgery depending on surgical timeline.  She reported that her PCP did not want to recheck it for 6 months from her last check, I discussed with her that we could submit an order for that depending on surgical timeline.  Discussed with patient to please call us when she completes her nicotine test and we will notify her once we get the results and discuss surgical planning at that time.  All of her questions were answered to her content.  Emma Casey 07/26/2022, 10:00 AM

## 2022-07-29 ENCOUNTER — Encounter: Payer: Self-pay | Admitting: Nurse Practitioner

## 2022-08-01 ENCOUNTER — Other Ambulatory Visit: Payer: Self-pay | Admitting: Nurse Practitioner

## 2022-08-01 DIAGNOSIS — E2839 Other primary ovarian failure: Secondary | ICD-10-CM

## 2022-08-01 DIAGNOSIS — Z170 Estrogen receptor positive status [ER+]: Secondary | ICD-10-CM

## 2022-08-11 ENCOUNTER — Ambulatory Visit: Payer: Medicare Other | Admitting: Neurology

## 2022-08-11 ENCOUNTER — Encounter: Payer: Self-pay | Admitting: Neurology

## 2022-08-11 VITALS — BP 116/72 | HR 92 | Ht 66.0 in | Wt 249.4 lb

## 2022-08-11 DIAGNOSIS — Z789 Other specified health status: Secondary | ICD-10-CM | POA: Diagnosis not present

## 2022-08-11 DIAGNOSIS — Z9989 Dependence on other enabling machines and devices: Secondary | ICD-10-CM

## 2022-08-11 DIAGNOSIS — G4733 Obstructive sleep apnea (adult) (pediatric): Secondary | ICD-10-CM

## 2022-08-11 NOTE — Patient Instructions (Signed)
Please continue using your BiPAP regularly. While your insurance requires that you use BiPAP at least 4 hours each night on 70% of the nights, I recommend, that you not skip any nights and use it throughout the night if you can. Getting used to BiPAP and staying with the treatment long term does take time and patience and discipline. Untreated obstructive sleep apnea when it is moderate to severe can have an adverse impact on cardiovascular health and raise her risk for heart disease, arrhythmias, hypertension, congestive heart failure, stroke and diabetes. Untreated obstructive sleep apnea causes sleep disruption, nonrestorative sleep, and sleep deprivation. This can have an impact on your day to day functioning and cause daytime sleepiness and impairment of cognitive function, memory loss, mood disturbance, and problems focussing. Using BiPAP regularly can improve these symptoms.   We will bring you in for a repeat sleep study at night to treat you with a BiPAP machine and optimize your treatment settings hopefully.  We will plan a follow-up after testing.

## 2022-08-11 NOTE — Progress Notes (Signed)
Subjective:    Patient ID: Emma Casey is a 61 y.o. female.  HPI    Interim history:   Emma Casey is a 61 year old right-handed woman with an underlying medical history of breast cancer (s/p L sided surgery in 2020, chemotherapy and radiation), history of arthritis, back pain, depression, anxiety, reflux disease, hypertension, hyperlipidemia, lymphedema, prediabetes, and severe obesity with a BMI of over 49, who presents for follow-up consultation of her obstructive sleep apnea after interim testing and starting BiPAP therapy.  The patient is unaccompanied today.  I first met her at the request of her primary care physician on 03/02/2022, at which time she reported a prior diagnosis of obstructive sleep apnea.  She had severe sleep apnea and sleep testing was several years ago, she had CPAP intolerance.  She was not compliant with her CPAP.  She was advised to proceed with sleep testing.  She had an emergency split study on 03/23/2022 which showed a baseline AHI of 55.8/h, O2 nadir was 72%.  CPAP alone was not sufficient to reduce her severe sleep apnea and she was treated with BiPAP therapy, requiring higher pressures.  Full resolution of her sleep apnea was not achieved during the study.  She was advised to proceed with home auto BiPAP therapy.  Her set up date was 05/19/2022.  She has a Retail banker.  Today, 08/11/2022: I reviewed her auto BiPAP compliance data from 05/19/2022 through 06/17/2022, which is a total of 30 days, during which time she used her machine 26 days with percent use days greater than 4 hours at 80%, indicating very good compliance with an average usage of 7 hours and 51 minutes for days on treatment, residual AHI elevated at 23.5/h, leak on the high side with the 95th percentile at 31.8 L/min, maximum IPAP of 20, minimum EPAP of 8, pressure support 5 cm.  95th percentile of IPAP was 19.9 cm, 95th percentile of EPAP was 14.9 cm. She reports feeling better.   She had a bout of bronchitis, she was treated with Ladona Ridgel and a Z-Pak, she had significant coughing but it is better now.  She is motivated to get back on her BiPAP machine, she does feel that she has benefited from treatment and that her sleep consolidation and sleep quality are better and that her daytime somnolence was also better when she was on the machine.  She uses a fullface mask.  Her current Epworth sleepiness score is 20 out of 24 but she does report feeling better when she was actually on her BiPAP therapy.  She would be willing to come in for a formal titration study as she still has residual events, we talked about her compliance data and her sleep study results in detail today.  The patient's allergies, current medications, family history, past medical history, past social history, past surgical history and problem list were reviewed and updated as appropriate.   Previously:   03/02/22: (She) was previously diagnosed with obstructive sleep apnea and placed on PAP therapy. I was able to review some of her prior sleep study records. She had a sleep study at [facility] on 09/06/2010, reportedly showing severe obstructive sleep apnea.  She had a titration study at [facility] on 09/29/2010 which indicated resolution of her sleep disordered breathing with 17 cm of water pressure.  Study was interpreted by Dr. Brandon Melnick. I reviewed your office note from 01/05/2022.  She was unable to tolerate CPAP therapy at the  time. She reports that she tried different types of mask options, most recently nasal pillows by her description.  She has tried using her machine about a month ago, she did not bring her machine for today's visit, a compliance download is not available.   Her Epworth sleepiness score is 22 out of 24, fatigue severity score is 59 out of 63. She lives with her roommate, she is widowed, husband passed away in 10-06-2013, she has 2 stepsons.  She quit smoking yesterday,  she does not drink alcohol currently, does not drink caffeine daily.  Bedtime is generally around 9 and rise time around 8.  He has significant nocturia about 6 or 7 times per average night.  She does not have recurrent morning headaches or nocturnal headaches.  Weight has been more or less stable lately.  At the time of her sleep testing in 10/06/2010 she weighed around 293 pounds.  Her highest weight has been around 300 pounds.   Her Past Medical History Is Significant For: Past Medical History:  Diagnosis Date   Anxiety    Arthritis    right hand   Back pain    Breast cancer ([facility])    Bronchitis    Cancer ([place]) 11/2018   left breast ca   Chronic fatigue syndrome    Constipation    Depression    Diabetes mellitus without complication (HCC)    Family history of breast cancer    GERD (gastroesophageal reflux disease)    Headache    Heart murmur    Herpes 1&2    Hyperlipidemia    Hypertension    Joint pain    Leg fracture, left    as a child   Lymphedema    Multiple food allergies    OSA on CPAP    has CPAP but has not used in over a month   Personal history of chemotherapy    Personal history of radiation therapy    Prediabetes    Shortness of breath     Her Past Surgical History Is Significant For: Past Surgical History:  Procedure Laterality Date   ABDOMINAL HYSTERECTOMY     BREAST LUMPECTOMY WITH RADIOACTIVE SEED AND SENTINEL LYMPH NODE BIOPSY Left 01/08/2019   Procedure: LEFT BREAST LUMPECTOMY WITH RADIOACTIVE SEED AND LEFT AXILLARY DEEP SENTINEL LYMPH NODE BIOPSY WITH BLUE DYE INJECTION;  Surgeon: Fanny Skates, MD;  Location: [place];  Service: General;  Laterality: Left;   COLONOSCOPY N/A 09/02/2015   Procedure: COLONOSCOPY;  Surgeon: Danie Binder, MD;  [facility];  Service: Endoscopy;  Laterality: N/A;  11:15 Am   EYE SURGERY Right    cataract   HAND SURGERY Right    broken bone   MASS EXCISION Left 10/01/2021   Procedure: EXCISION  MASS LEFT PALM;  Surgeon: Daryll Brod, MD;  Location: [place];  Service: Orthopedics;  Laterality: Left;   PORT-A-CATH REMOVAL Right 08/21/2019   Procedure: REMOVAL PORT-A-CATH;  Surgeon: Fanny Skates, MD;  Location: [place];  Service: General;  Laterality: Right;   PORTACATH PLACEMENT N/A 03/01/2019   Procedure: INSERTION PORT-A-CATH;  Surgeon: Fanny Skates, MD;  Location: [place];  Service: General;  Laterality: N/A;   RE-EXCISION OF BREAST LUMPECTOMY Left 03/01/2019   Procedure: RE-EXCISION OF LEFT BREAST LUMPECTOMY MULITPLE MARGINS;  Surgeon: Fanny Skates, MD;  Location: [place];  Service: General;  Laterality: Left;   TUBAL LIGATION      Her Family History Is Significant  For: Family History  Problem Relation Age of Onset   Stroke Mother    Transient ischemic attack Father    Heart attack Father    Hypertension Father    Hyperlipidemia Father    Heart disease Father    Sleep apnea Father    Stroke Brother    Breast cancer Maternal Aunt 42   Breast cancer Cousin        mat first cousin   Breast cancer Cousin        mat first cousin   Cancer Cousin        mat first cousin    Her Social History Is Significant For: Social History   Socioeconomic History   Marital status: Widowed    Spouse name: Not on file   Number of children: Not on file   Years of education: Not on file   Highest education level: Not on file  Occupational History   Occupation: on disability  Tobacco Use   Smoking status: Former    Packs/day: 0.25    Years: 10.00    Total pack years: 2.50    Types: Cigarettes    Quit date: 06/29/2022    Years since quitting: 0.1   Smokeless tobacco: Never   Tobacco comments:    1-2 cigarette per day  Vaping Use   Vaping Use: Never used  Substance and Sexual Activity   Alcohol use: No   Drug use: No   Sexual activity: Not on file  Other Topics Concern   Not on file  Social History Narrative   Caffeine 1 cup coffee  once week, soda- Sprite.    Education: 12th grade.   Work disabilty-broke R Acupuncturist. CMA work,  L hand csyt removed.  Breast Ca/ lymphedema.    Social Determinants of Health   Financial Resource Strain: Not on file  Food Insecurity: Not on file  Transportation Needs: No Transportation Needs (06/11/2019)   PRAPARE - Hydrologist (Medical): No    Lack of Transportation (Non-Medical): No  Physical Activity: Not on file  Stress: Not on file  Social Connections: Not on file    Her Allergies Are:  Allergies  Allergen Reactions   Asa [Aspirin] Hives   Nsaids Hives   Penicillins     Tolerated Ancef 01/08/2019 Has patient had a PCN reaction causing immediate rash, facial/tongue/throat swelling, SOB or lightheadedness with hypotension: Yesyes Has patient had a PCN reaction causing severe rash involving mucus membranes or skin necrosis: Nono Has patient had a PCN reaction that required hospitalization Nono Has patient had a PCN reaction occurring within the last 10 years: Nono If all of the above answers are "NO", then may proceed with Cephalosporin  :   Her Current Medications Are:  Outpatient Encounter Medications as of 08/11/2022  Medication Sig   acyclovir (ZOVIRAX) 400 MG tablet Take 400 mg by mouth 2 (two) times daily.   amLODipine (NORVASC) 5 MG tablet Take 5 mg by mouth daily.   Ascorbic Acid (VITAMIN C) 1000 MG tablet Take 1,000 mg by mouth daily.   atorvastatin (LIPITOR) 20 MG tablet Take 20 mg by mouth daily.   diphenhydrAMINE (BENADRYL) 25 MG tablet Take 25 mg by mouth every 6 (six) hours as needed.   fluticasone (FLONASE) 50 MCG/ACT nasal spray Place 2 sprays into both nostrils daily.    folic acid (FOLVITE) 407 MCG tablet Take 400 mcg by mouth daily.    gabapentin (NEURONTIN) 300 MG capsule TAKE 1  CAPSULE BY MOUTH  TWICE DAILY (Patient taking differently: 670m in AM and PM, 3014mat 1200 if needed.)   lactulose (CHRONULAC) 10 GM/15ML solution  Take 15 mLs (10 g total) by mouth 2 (two) times daily.   letrozole (FEMARA) 2.5 MG tablet TAKE 1 TABLET BY MOUTH  DAILY   lisinopril (ZESTRIL) 2.5 MG tablet Take 2.5 mg by mouth at bedtime.   loratadine (CLARITIN) 10 MG tablet Take 10 mg by mouth daily.   metFORMIN (GLUCOPHAGE-XR) 500 MG 24 hr tablet 1,000 mg 2 (two) times daily.   montelukast (SINGULAIR) 10 MG tablet Take 10 mg by mouth at bedtime.   Multiple Vitamin (MULTIVITAMIN) capsule Take 1 capsule by mouth daily.   omeprazole (PRILOSEC) 20 MG capsule Take 20 mg by mouth daily.   PARoxetine (PAXIL) 10 MG tablet Take 10 mg by mouth at bedtime.   potassium chloride (MICRO-K) 10 MEQ CR capsule Take 10 mEq by mouth 2 (two) times daily.   tiZANidine (ZANAFLEX) 4 MG tablet Take 4 mg by mouth 3 (three) times daily.   topiramate (TOPAMAX) 100 MG tablet Take 100 mg by mouth daily.   vitamin B-12 (CYANOCOBALAMIN) 500 MCG tablet Take 500 mcg by mouth daily.    prochlorperazine (COMPAZINE) 10 MG tablet Take 1 tablet (10 mg total) by mouth every 6 (six) hours as needed (Nausea or vomiting).   No facility-administered encounter medications on file as of 08/11/2022.  :  Review of Systems:  Out of a complete 14 point review of systems, all are reviewed and negative with the exception of these symptoms as listed below:  Review of Systems  Neurological:        Intial cpap f/u , was doing ok  then got respiratory illness/ bronchitis and was not using since that.  ESS 20.    Objective:  Neurological Exam  Physical Exam Physical Examination:   Vitals:   08/11/22 1006  BP: 116/72  Pulse: 92    General Examination: The patient is a very pleasant [age].o. female in no acute distress. She appears well-developed and well-nourished and well groomed.   HEENT: Normocephalic, atraumatic, pupils are equal, round and reactive to light, extraocular tracking is good without limitation to gaze excursion or nystagmus noted. Hearing is grossly intact. Face  is symmetric with normal facial animation. Speech is clear with no dysarthria noted. There is no hypophonia. There is no lip, neck/head, jaw or voice tremor. Neck is supple with full range of passive and active motion. There are no carotid bruits on auscultation. Oropharynx exam reveals: moderate mouth dryness, edentulous, marked airway crowding. Tongue protrudes centrally.    Chest: Clear to auscultation without wheezing, rhonchi or crackles noted.   Heart: S1+S2+0, regular and normal without murmurs, rubs or gallops noted.    Abdomen: Soft, non-tender and non-distended.   Extremities: There is no pitting edema in the distal lower extremities bilaterally.    Skin: Warm and dry without trophic changes noted.    Musculoskeletal: exam reveals no obvious joint deformities.    Neurologically:  Mental status: The patient is awake, alert and oriented in all 4 spheres. Her immediate and remote memory, attention, language skills and fund of knowledge are appropriate. There is no evidence of aphasia, agnosia, apraxia or anomia. Speech is clear with normal prosody and enunciation. Thought process is linear. Mood is normal and affect is normal.  Cranial nerves II - XII are as described above under HEENT exam.  Motor exam: Normal bulk, strength and tone  is noted. There is no obvious tremor. Fine motor skills and coordination: grossly intact.  Cerebellar testing: No dysmetria or intention tremor. There is no truncal or gait ataxia.  Sensory exam: intact to light touch in the upper and lower extremities.  Gait, station and balance: She stands easily. No veering to one side is noted. No leaning to one side is noted. Posture is age-appropriate and stance is narrow based. Gait shows normal stride length and normal pace. No problems turning are noted.  Assessment and Plan:  In summary, Emma Casey is a very pleasant 61 year old female with an underlying medical history of breast cancer (s/p surgery in 2020,  chemotherapy and radiation), history of arthritis, back pain, depression, anxiety, reflux disease, hypertension, hyperlipidemia, lymphedema, prediabetes, and severe obesity with a BMI of over 41, who presents for follow-up consultation of her obstructive sleep apnea after interim testing and starting autoBiPAP therapy.  She carries a prior diagnosis of obstructive sleep apnea in the severe range and prior CPAP intolerance some 10 years ago.  She had a emergency split-night sleep study on 03/23/2022 which showed a baseline AHI of 55.8/h, O2 nadir 72%. CPAP alone was not sufficient to treat her severe sleep apnea and an optimal pressure was not achieved during the treatment portion of the study with BiPAP.  She did require higher pressures and is currently on auto BiPAP with an average pressure of 20/15 for the 95th percentile.  She was compliant in the first month but then developed bronchitis and stopped using her machine.  She is very motivated to get back on it.  She is encouraged to be consistent with her current auto BiPAP.  We will proceed with a overnight laboratory attended BiPAP titration study to optimize her treatment settings, her residual AHI is in the moderate range primarily due to obstructive events.  She is agreeable to this plan.  We will plan a follow-up after her next sleep study.  In the interim she is advised to be consistent with her current AutoBiPAP.  I answered all her questions today and she was in agreement.   I spent 30 minutes in total face-to-face time and in reviewing records during pre-charting, more than 50% of which was spent in counseling and coordination of care, reviewing test results, reviewing medications and treatment regimen and/or in discussing or reviewing the diagnosis of OSA, the prognosis and treatment options. Pertinent laboratory and imaging test results that were available during this visit with the patient were reviewed by me and considered in my medical decision  making (see chart for details).

## 2022-08-16 LAB — NICOTINE/COTININE METABOLITES
Cotinine: <1 ng/mL
Nicotine: <1 ng/mL

## 2022-08-19 ENCOUNTER — Encounter: Payer: Self-pay | Admitting: Surgical

## 2022-08-19 ENCOUNTER — Telehealth (INDEPENDENT_AMBULATORY_CARE_PROVIDER_SITE_OTHER): Payer: Medicare Other | Admitting: Surgical

## 2022-08-19 DIAGNOSIS — Z87891 Personal history of nicotine dependence: Secondary | ICD-10-CM | POA: Diagnosis not present

## 2022-08-19 DIAGNOSIS — Z923 Personal history of irradiation: Secondary | ICD-10-CM | POA: Diagnosis not present

## 2022-08-19 DIAGNOSIS — E118 Type 2 diabetes mellitus with unspecified complications: Secondary | ICD-10-CM

## 2022-08-19 DIAGNOSIS — N651 Disproportion of reconstructed breast: Secondary | ICD-10-CM | POA: Diagnosis not present

## 2022-08-19 NOTE — Progress Notes (Signed)
   Referring Provider Luciano Cutter, DO [place],  [place] [address]   CC: No chief complaint on file.     Emma Casey is an 60 y.o. female.  HPI: Patient is a 61 year old female who presents via virtual visit to discuss recent nicotine test results and discuss surgical planning.  She has a history of left breast partial mastectomy approximately 3 years ago with Dr. [name], subsequently had radiation.  She has severe asymmetry of bilateral breast due to the left partial mastectomy.  She was previously a smoker but has since quit and has completed a nicotine cotinine test which was negative.  She has previously discussed breast reduction for improved symmetry with Dr. Marla Roe, patient is aware of the risks, she is aware of possible nipple areolar loss.  She is also aware that free nipple graft may be necessary.  She did have her A1c checked most recently on 05/23/2022, A1c was 7.3 at that time.  We will need an updated A1c prior to surgery, patient is aware of this.  The patient gave consent to have this visit done by telemedicine / virtual visit, two identifiers were used to identify patient. This is also consent for access the chart and treat the patient via this visit. The patient is located at home.  I, the provider, am at the office.  We spent 5 minutes together for the visit.  Joined by video visit.  Patient reports she is interested in moving forward with bilateral breast reduction for improved symmetry since she had a left partial mastectomy.  She reports that she is aware that she may need free nipple graft on the left side.  She is also aware that she may develop postoperative issues with healing due to the radiation.   Review of Systems General: No fevers or chills  Physical Exam    08/11/2022   10:06 AM 07/26/2022    9:49 AM 07/25/2022   10:34 AM  Vitals with BMI  Height '5\' 6"'$  '5\' 5"'$    Weight 249 lbs 6 oz 245 lbs 246 lbs 13 oz  BMI 23.34 35.68   Systolic 616 837 290   Diastolic 72 79 82  Pulse 92 86 90    General:  No acute distress,  Alert and oriented, Non-Toxic, Normal speech and affect Mood and behavior is normal  Assessment/Plan 61 year old female interested in bilateral breast reduction for improved symmetry after left partial mastectomy and radiation.  She had a negative nicotine cotinine test.  We will order an A1c to be sure this is under 7.5.  Once this has resulted we can schedule her for surgery and submit her for authorization for bilateral breast reduction with Dr. Marla Roe for improved symmetry.  All the patient's questions were answered to her content.    [name] 08/19/2022, 12:01 PM

## 2022-08-25 DIAGNOSIS — E118 Type 2 diabetes mellitus with unspecified complications: Secondary | ICD-10-CM | POA: Diagnosis not present

## 2022-08-26 LAB — HEMOGLOBIN A1C
Est. average glucose Bld gHb Est-mCnc: 171 mg/dL
Hgb A1c MFr Bld: 7.6 % — ABNORMAL HIGH (ref 4.8–5.6)

## 2022-08-30 ENCOUNTER — Ambulatory Visit (INDEPENDENT_AMBULATORY_CARE_PROVIDER_SITE_OTHER): Payer: Self-pay | Admitting: Surgical

## 2022-08-30 DIAGNOSIS — Z170 Estrogen receptor positive status [ER+]: Secondary | ICD-10-CM

## 2022-08-30 DIAGNOSIS — F17200 Nicotine dependence, unspecified, uncomplicated: Secondary | ICD-10-CM

## 2022-08-30 DIAGNOSIS — N6489 Other specified disorders of breast: Secondary | ICD-10-CM

## 2022-08-30 DIAGNOSIS — C50212 Malignant neoplasm of upper-inner quadrant of left female breast: Secondary | ICD-10-CM

## 2022-08-30 DIAGNOSIS — E118 Type 2 diabetes mellitus with unspecified complications: Secondary | ICD-10-CM

## 2022-08-30 NOTE — Progress Notes (Signed)
Patient is a 62 year old female here for telephone follow-up to discuss surgical planning.  She initially saw Dr. Marla Roe to discuss bilateral breast reduction for improvement in symmetry due to history of left breast partial mastectomy.  Of note she does have a history of radiation to the left breast.  She was previously a smoker, but had a recent negative nicotine test.  She is also diabetic, therefore we ordered an A1c to ensure A1c was less than 7.5 for surgical planning.  She is here today via telephone to discuss A1c results. Unfortunately A1c was 7.6.  The patient gave consent to have this visit done by telemedicine / virtual visit, two identifiers were used to identify patient. This is also consent for access the chart and treat the patient via this visit. The patient is located at home in [place].  I, the provider, am at the office.  We spent 4 minutes together for the visit.  Joined by telephone.   A/P:  Due to the patient's history of radiation on the left breast and A1c of 7.6, recommendation is for improvement in A1c prior to surgical intervention.  We discussed that patients with history of radiation and elevated A1c's are at a significantly increased risk of postoperative complications and significant wounds after breast reduction.  I discussed with the patient that Dr. Marla Roe and I discussed her case and the recommendation at this time is to reevaluate and plan for surgery after A1c is improved.  Recommend discussing her A1c with her PCP.  She reports she does not currently see an endocrinologist.  We discussed that she may need to discuss changes in management/discussed dietary changes with her PCP.  Patient was understanding of this and in agreement with following up in 9-month

## 2022-09-08 ENCOUNTER — Telehealth: Payer: Self-pay | Admitting: Neurology

## 2022-09-08 NOTE — Telephone Encounter (Signed)
BIPAP- [employer] medicare no auth req   Patient is scheduled at [facility] for 11/01/22 at 8 pm.  Mailed packet to the patient.

## 2022-09-13 ENCOUNTER — Ambulatory Visit: Payer: Medicare Other | Admitting: Physical Therapy

## 2022-09-18 DIAGNOSIS — G4733 Obstructive sleep apnea (adult) (pediatric): Secondary | ICD-10-CM | POA: Diagnosis not present

## 2022-09-23 DIAGNOSIS — J069 Acute upper respiratory infection, unspecified: Secondary | ICD-10-CM | POA: Diagnosis not present

## 2022-11-15 ENCOUNTER — Telehealth: Payer: Self-pay

## 2022-11-15 ENCOUNTER — Other Ambulatory Visit: Payer: Self-pay

## 2022-11-15 MED ORDER — LETROZOLE 2.5 MG PO TABS: 2.5000 mg | ORAL_TABLET | Freq: Every day | ORAL | 2 refills | Status: DC

## 2022-11-15 NOTE — Telephone Encounter (Signed)
Pt called confirming that her insurance is ending at the end of March 2024.  Pt stated she is switching to [facility] which will go in effect in April 2024.  Pt stated she will provide registration her new insurance information when she come in for her next appt in April 2024.  Pt also stated she is now using [facility] for her prescriptions.

## 2022-11-17 ENCOUNTER — Other Ambulatory Visit: Payer: Self-pay

## 2022-11-17 DIAGNOSIS — H26491 Other secondary cataract, right eye: Secondary | ICD-10-CM | POA: Diagnosis not present

## 2022-11-17 DIAGNOSIS — G4733 Obstructive sleep apnea (adult) (pediatric): Secondary | ICD-10-CM | POA: Diagnosis not present

## 2022-11-17 DIAGNOSIS — Z961 Presence of intraocular lens: Secondary | ICD-10-CM | POA: Diagnosis not present

## 2022-11-17 DIAGNOSIS — H25812 Combined forms of age-related cataract, left eye: Secondary | ICD-10-CM | POA: Diagnosis not present

## 2022-11-23 ENCOUNTER — Encounter: Payer: Self-pay | Admitting: Hematology

## 2022-12-13 ENCOUNTER — Encounter: Payer: Self-pay | Admitting: Hematology

## 2022-12-16 ENCOUNTER — Encounter: Payer: Self-pay | Admitting: Hematology

## 2022-12-20 ENCOUNTER — Encounter (HOSPITAL_COMMUNITY)
Admission: RE | Admit: 2022-12-20 | Discharge: 2022-12-20 | Disposition: A | Discharge status: Home / Self Care | Payer: Medicare PPO | Source: Ambulatory Visit | Attending: Ophthalmology | Admitting: Ophthalmology

## 2022-12-20 NOTE — Progress Notes (Signed)
Called pt for PAT phone call. No answer. Left voicemail

## 2022-12-20 NOTE — Pre-Procedure Instructions (Signed)
Attempted pre-op phone call. Left VM for her to cal [place] back.

## 2022-12-21 NOTE — H&P (View-Only) (Signed)
Surgical History & Physical  Patient Name: Emma Casey DOB: 02/21/1961  Surgery: Cataract extraction with intraocular lens implant phacoemulsification; Left Eye  Surgeon: Braylee Bosher MD Surgery Date:  12-23-22 Pre-Op Date:  12-15-22  HPI: A 62 Yr. old female patient is referred by Dr Martin for cataract eval. 1. 1. The patient complains of difficulty when recognizing people/driving/reading/seeing small captions on TV, which began 1 year ago. Both eyes are affected. The episode is gradual. The condition's severity increased since last visit. Symptoms occur when the patient is driving, inside, outside and reading. The complaint is associated with glare. This is negatively affecting the patient's quality of life and the patient is unable to function adequately in life with the current level of vision. HPI was performed by Charles Andringa .  Medical History: Retinal Detachment Cataracts Cancer Diabetes Gastroesophageal reflux, migraine, sleep apnea High Blood Pressure  Review of Systems Negative Allergic/Immunologic Negative Cardiovascular Negative Constitutional Negative Ear, Nose, Mouth & Throat Negative Endocrine Negative Eyes Negative Gastrointestinal Negative Genitourinary Negative Hemotologic/Lymphatic Negative Integumentary Negative Musculoskeletal Negative Neurological Negative Psychiatry Negative Respiratory  Social   Current every day smoker   Medication Hydrocodone/Acetaminophen, Lisinopril, Metformin, Omeprazole, Ondansetron, Paroxetine, Topamax, Potassium chloride, Prochlorperazine,   Sx/Procedures Cataract extraction OD, Retinal Detachment Repair,  Hysterectomy, Lumpectomy (Breast): chemo and radiation, Wrist Sx,   Drug Allergies  Pencilin, NSAIDs,   History & Physical: Heent: cataract, left eye NECK: supple without bruits LUNGS: lungs clear to auscultation CV: regular rate and rhythm Abdomen: soft and non-tender Impression & Plan: Assessment: 1.   COMBINED FORMS AGE RELATED CATARACT; Left Eye (H25.812) 2.  Myopia ; Right Eye (H52.11) - Resolved 3.  PCO; Right Eye (H26.491) 4.  INTRAOCULAR LENS IOL ; Right Eye (Z96.1)  Plan: 1.  Cataract accounts for the patient's decreased vision. This visual impairment is not correctable with a tolerable change in glasses or contact lenses. Cataract surgery with an implantation of a new lens should significantly improve the visual and functional status of the patient. Discussed all risks, benefits, alternatives, and potential complications. Discussed the procedures and recovery. Patient desires to have surgery. A-scan ordered and performed today for intra-ocular lens calculations. The surgery will be performed in order to improve vision for driving, reading, and for eye examinations. Recommend phacoemulsification with intra-ocular lens. Recommend Dextenza for post-operative pain and inflammation. Left Eye. Surgery required to correct imbalance of vision. Dilates poorly - shugarcaine by protocol. Vision Blue. Malyugin Ring. Omidira. Use [id number] for balance.  2.   3.  Asymptomatic. Findings, prognosis and treatment options reviewed. No indication for laser at this point, will observe for changes.  4.  mild PCO as above. Likely Alcon IOL - patient believes that the power was 22.0 

## 2022-12-21 NOTE — H&P (Signed)
Surgical History & Physical  Patient Name: Emma Casey DOB: [birth year]  Surgery: Cataract extraction with intraocular lens implant phacoemulsification; Left Eye  Surgeon: Fabio Pierce MD Surgery Date:  12-23-22 Pre-Op Date:  12-15-22  HPI: A 71 Yr. old female patient is referred by Dr Daphine Deutscher for cataract eval. 1. 1. The patient complains of difficulty when recognizing people/driving/reading/seeing small captions on TV, which began 1 year ago. Both eyes are affected. The episode is gradual. The condition's severity increased since last visit. Symptoms occur when the patient is driving, inside, outside and reading. The complaint is associated with glare. This is negatively affecting the patient's quality of life and the patient is unable to function adequately in life with the current level of vision. HPI was performed by Fabio Pierce .  Medical History: Retinal Detachment Cataracts Cancer Diabetes Gastroesophageal reflux, migraine, sleep apnea High Blood Pressure  Review of Systems Negative Allergic/Immunologic Negative Cardiovascular Negative Constitutional Negative Ear, Nose, Mouth & Throat Negative Endocrine Negative Eyes Negative Gastrointestinal Negative Genitourinary Negative Hemotologic/Lymphatic Negative Integumentary Negative Musculoskeletal Negative Neurological Negative Psychiatry Negative Respiratory  Social   Current every day smoker   Medication Hydrocodone/Acetaminophen, Lisinopril, Metformin, Omeprazole, Ondansetron, Paroxetine, Topamax, Potassium chloride, Prochlorperazine,   Sx/Procedures Cataract extraction OD, Retinal Detachment Repair,  Hysterectomy, Lumpectomy (Breast): chemo and radiation, Wrist Sx,   Drug Allergies  Pencilin, NSAIDs,   History & Physical: Heent: cataract, left eye NECK: supple without bruits LUNGS: lungs clear to auscultation CV: regular rate and rhythm Abdomen: soft and non-tender Impression & Plan: Assessment: 1.   COMBINED FORMS AGE RELATED CATARACT; Left Eye (H25.812) 2.  Myopia ; Right Eye (H52.11) - Resolved 3.  PCO; Right Eye (H26.491) 4.  INTRAOCULAR LENS IOL ; Right Eye (Z96.1)  Plan: 1.  Cataract accounts for the patient's decreased vision. This visual impairment is not correctable with a tolerable change in glasses or contact lenses. Cataract surgery with an implantation of a new lens should significantly improve the visual and functional status of the patient. Discussed all risks, benefits, alternatives, and potential complications. Discussed the procedures and recovery. Patient desires to have surgery. A-scan ordered and performed today for intra-ocular lens calculations. The surgery will be performed in order to improve vision for driving, reading, and for eye examinations. Recommend phacoemulsification with intra-ocular lens. Recommend Dextenza for post-operative pain and inflammation. Left Eye. Surgery required to correct imbalance of vision. Dilates poorly - shugarcaine by protocol. Vision [employer]. Malyugin Ring. Omidira. Use SY60WF for balance.  2.   3.  Asymptomatic. Findings, prognosis and treatment options reviewed. No indication for laser at this point, will observe for changes.  4.  mild PCO as above. Likely Alcon IOL - patient believes that the power was 22.0

## 2022-12-22 NOTE — Pre-Procedure Instructions (Signed)
Attempted pre-op phonecall. Left VM for her to call us back. 

## 2022-12-23 ENCOUNTER — Encounter (HOSPITAL_COMMUNITY): Admission: RE | Payer: Self-pay | Source: Home / Self Care

## 2022-12-23 ENCOUNTER — Ambulatory Visit (HOSPITAL_BASED_OUTPATIENT_CLINIC_OR_DEPARTMENT_OTHER): Payer: Medicare PPO | Admitting: Certified Registered Nurse Anesthetist

## 2022-12-23 ENCOUNTER — Encounter (HOSPITAL_COMMUNITY): Payer: Self-pay | Admitting: Ophthalmology

## 2022-12-23 ENCOUNTER — Encounter (HOSPITAL_COMMUNITY)
Admission: RE | Disposition: A | Discharge status: Home / Self Care | Payer: Self-pay | Source: Ambulatory Visit | Attending: Ophthalmology

## 2022-12-23 ENCOUNTER — Ambulatory Visit (HOSPITAL_COMMUNITY): Payer: Medicare PPO | Admitting: Certified Registered Nurse Anesthetist

## 2022-12-23 ENCOUNTER — Ambulatory Visit (HOSPITAL_COMMUNITY)
Admission: RE | Admit: 2022-12-23 | Discharge: 2022-12-23 | Disposition: A | Discharge status: Home / Self Care | Payer: Medicare PPO | Source: Ambulatory Visit | Attending: Ophthalmology | Admitting: Ophthalmology

## 2022-12-23 ENCOUNTER — Ambulatory Visit (HOSPITAL_COMMUNITY): Admission: RE | Admit: 2022-12-23 | Payer: Medicare PPO | Source: Home / Self Care | Admitting: Ophthalmology

## 2022-12-23 DIAGNOSIS — H25812 Combined forms of age-related cataract, left eye: Secondary | ICD-10-CM | POA: Insufficient documentation

## 2022-12-23 DIAGNOSIS — I10 Essential (primary) hypertension: Secondary | ICD-10-CM

## 2022-12-23 DIAGNOSIS — E119 Type 2 diabetes mellitus without complications: Secondary | ICD-10-CM | POA: Diagnosis not present

## 2022-12-23 DIAGNOSIS — Z87891 Personal history of nicotine dependence: Secondary | ICD-10-CM

## 2022-12-23 DIAGNOSIS — F418 Other specified anxiety disorders: Secondary | ICD-10-CM | POA: Diagnosis not present

## 2022-12-23 DIAGNOSIS — E1136 Type 2 diabetes mellitus with diabetic cataract: Secondary | ICD-10-CM | POA: Diagnosis not present

## 2022-12-23 DIAGNOSIS — F17200 Nicotine dependence, unspecified, uncomplicated: Secondary | ICD-10-CM | POA: Diagnosis not present

## 2022-12-23 DIAGNOSIS — Z6841 Body Mass Index (BMI) 40.0 and over, adult: Secondary | ICD-10-CM | POA: Insufficient documentation

## 2022-12-23 DIAGNOSIS — K219 Gastro-esophageal reflux disease without esophagitis: Secondary | ICD-10-CM | POA: Diagnosis not present

## 2022-12-23 HISTORY — PX: CATARACT EXTRACTION W/PHACO: SHX586

## 2022-12-23 LAB — GLUCOSE, CAPILLARY
Glucose-Capillary: 88 mg/dL (ref 70–99)
Glucose-Capillary: 92 mg/dL (ref 70–99)

## 2022-12-23 SURGERY — PHACOEMULSIFICATION, CATARACT, WITH IOL INSERTION
Anesthesia: Monitor Anesthesia Care | Site: Eye | Laterality: Left

## 2022-12-23 SURGERY — PHACOEMULSIFICATION, CATARACT, WITH IOL INSERTION
Anesthesia: Monitor Anesthesia Care | Laterality: Left

## 2022-12-23 MED ORDER — TROPICAMIDE 1 % OP SOLN
1.0000 [drp] | OPHTHALMIC | Status: AC | PRN
  Administered 2022-12-23 (×3): 1 [drp] via OPHTHALMIC

## 2022-12-23 MED ORDER — MIDAZOLAM HCL 5 MG/5ML IJ SOLN
INTRAMUSCULAR | Status: DC | PRN
  Administered 2022-12-23: 1 mg via INTRAVENOUS

## 2022-12-23 MED ORDER — FENTANYL CITRATE (PF) 100 MCG/2ML IJ SOLN
INTRAMUSCULAR | Status: AC
  Filled 2022-12-23: qty 2

## 2022-12-23 MED ORDER — FENTANYL CITRATE (PF) 100 MCG/2ML IJ SOLN
INTRAMUSCULAR | Status: DC | PRN
  Administered 2022-12-23: 50 ug via INTRAVENOUS

## 2022-12-23 MED ORDER — PHENYLEPHRINE-KETOROLAC 1-0.3 % IO SOLN
INTRAOCULAR | Status: DC | PRN
  Administered 2022-12-23: 500 mL via OPHTHALMIC

## 2022-12-23 MED ORDER — BSS IO SOLN
INTRAOCULAR | Status: DC | PRN
  Administered 2022-12-23: 15 mL via INTRAOCULAR

## 2022-12-23 MED ORDER — POVIDONE-IODINE 5 % OP SOLN
OPHTHALMIC | Status: DC | PRN
  Administered 2022-12-23: 1 via OPHTHALMIC

## 2022-12-23 MED ORDER — SODIUM HYALURONATE 23MG/ML IO SOSY
PREFILLED_SYRINGE | INTRAOCULAR | Status: DC | PRN
  Administered 2022-12-23: .6 mL via INTRAOCULAR

## 2022-12-23 MED ORDER — PHENYLEPHRINE-KETOROLAC 1-0.3 % IO SOLN
INTRAOCULAR | Status: AC
  Filled 2022-12-23: qty 4

## 2022-12-23 MED ORDER — TRYPAN BLUE 0.06 % IO SOSY
PREFILLED_SYRINGE | INTRAOCULAR | Status: DC | PRN
  Administered 2022-12-23: .5 mL via INTRAOCULAR

## 2022-12-23 MED ORDER — LIDOCAINE HCL 3.5 % OP GEL
1.0000 | Freq: Once | OPHTHALMIC | Status: AC
  Administered 2022-12-23: 1 via OPHTHALMIC

## 2022-12-23 MED ORDER — MIDAZOLAM HCL 2 MG/2ML IJ SOLN
INTRAMUSCULAR | Status: AC
  Filled 2022-12-23: qty 2

## 2022-12-23 MED ORDER — PHENYLEPHRINE HCL 2.5 % OP SOLN
1.0000 [drp] | OPHTHALMIC | Status: AC | PRN
  Administered 2022-12-23 (×3): 1 [drp] via OPHTHALMIC

## 2022-12-23 MED ORDER — NEOMYCIN-POLYMYXIN-DEXAMETH 3.5-10000-0.1 OP SUSP
OPHTHALMIC | Status: DC | PRN
  Administered 2022-12-23: 2 [drp] via OPHTHALMIC

## 2022-12-23 MED ORDER — SODIUM CHLORIDE 0.9% FLUSH
INTRAVENOUS | Status: DC | PRN
  Administered 2022-12-23: 10 mL via INTRAVENOUS

## 2022-12-23 MED ORDER — EPINEPHRINE PF 1 MG/ML IJ SOLN
INTRAMUSCULAR | Status: AC
  Filled 2022-12-23: qty 1

## 2022-12-23 MED ORDER — TRYPAN BLUE 0.06 % IO SOSY
PREFILLED_SYRINGE | INTRAOCULAR | Status: AC
  Filled 2022-12-23: qty 0.5

## 2022-12-23 MED ORDER — LIDOCAINE HCL (PF) 1 % IJ SOLN
INTRAOCULAR | Status: DC | PRN
  Administered 2022-12-23: 1 mL via OPHTHALMIC

## 2022-12-23 MED ORDER — STERILE WATER FOR IRRIGATION IR SOLN
Status: DC | PRN
  Administered 2022-12-23: 250 mL

## 2022-12-23 MED ORDER — SODIUM HYALURONATE 10 MG/ML IO SOLUTION
PREFILLED_SYRINGE | INTRAOCULAR | Status: DC | PRN
  Administered 2022-12-23: .85 mL via INTRAOCULAR

## 2022-12-23 MED ORDER — TETRACAINE HCL 0.5 % OP SOLN
1.0000 [drp] | OPHTHALMIC | Status: AC | PRN
  Administered 2022-12-23 (×3): 1 [drp] via OPHTHALMIC

## 2022-12-23 SURGICAL SUPPLY — 14 items
CATARACT SUITE SIGHTPATH (MISCELLANEOUS) ×1 IMPLANT
CLOTH BEACON ORANGE TIMEOUT ST (SAFETY) ×1 IMPLANT
EYE SHIELD UNIVERSAL CLEAR (GAUZE/BANDAGES/DRESSINGS) IMPLANT
FEE CATARACT SUITE SIGHTPATH (MISCELLANEOUS) ×1 IMPLANT
GLOVE BIOGEL PI IND STRL 7.0 (GLOVE) ×2 IMPLANT
GLOVE SURG SS PI 6.5 STRL IVOR (GLOVE) IMPLANT
LENS CLAREON WAGON WHEEL 21.0 (Intraocular Lens) ×1 IMPLANT
LENS IOL CLRN WGN WHL 21.0 (Intraocular Lens) IMPLANT
NDL HYPO 18GX1.5 BLUNT FILL (NEEDLE) ×1 IMPLANT
NEEDLE HYPO 18GX1.5 BLUNT FILL (NEEDLE) ×1 IMPLANT
PAD ARMBOARD 7.5X6 YLW CONV (MISCELLANEOUS) ×1 IMPLANT
SYR TB 1ML LL NO SAFETY (SYRINGE) ×1 IMPLANT
TAPE SURG TRANSPORE 1 IN (GAUZE/BANDAGES/DRESSINGS) IMPLANT
WATER STERILE IRR 250ML POUR (IV SOLUTION) ×1 IMPLANT

## 2022-12-23 NOTE — Interval H&P Note (Signed)
History and Physical Interval Note:  12/23/2022 12:50 PM  Emma Casey  has presented today for surgery, with the diagnosis of combined forms age related cataract; left.  The various methods of treatment have been discussed with the patient and family. After consideration of risks, benefits and other options for treatment, the patient has consented to  Procedure(s) with comments: CATARACT EXTRACTION PHACO AND INTRAOCULAR LENS PLACEMENT (IOC) (Left) - CDE: as a surgical intervention.  The patient's history has been reviewed, patient examined, no change in status, stable for surgery.  I have reviewed the patient's chart and labs.  Questions were answered to the patient's satisfaction.     Emma Casey

## 2022-12-23 NOTE — Discharge Instructions (Signed)
Please discharge patient when stable, will follow up today with Dr. Elvena Oyer at the [facility] [place] office immediately following discharge.  Leave shield in place until visit.  All paperwork with discharge instructions will be given at the office.  [facility] Address:  [address]  [place], [place] [address]  

## 2022-12-23 NOTE — Op Note (Signed)
Date of procedure: 12/23/22  Pre-operative diagnosis: Visually significant age-related combined cataract, Left Eye (H25.812)  Post-operative diagnosis: Visually significant age-related combined cataract, Left Eye (H25.812)  Procedure: Removal of cataract via phacoemulsification and insertion of intra-ocular lens Alcon SY60WF  +21.0D into the capsular bag of the Left Eye  Attending surgeon: Rudy Jew. Deon Duer, MD, MA  Anesthesia: MAC, Topical Akten  Complications: None  Estimated Blood Loss: <42mL (minimal)  Specimens: None  Implants: As above  Indications:  Mature Visually significant age-related cataract, Left Eye  Procedure:  The patient was seen and identified in the pre-operative area. The operative eye was identified and dilated.  The operative eye was marked.  Topical anesthesia was administered to the operative eye.     The patient was then to the operative suite and placed in the supine position.  A timeout was performed confirming the patient, procedure to be performed, and all other relevant information.   The patient's face was prepped and draped in the usual fashion for intra-ocular surgery.  A lid speculum was placed into the operative eye and the surgical microscope moved into place and focused.  A lack of red reflex due to a mature cataract was confirmed.  An inferotemporal paracentesis was created using a 20 gauge paracentesis blade.  Vision blue was injected into the anterior chamber.  Shugarcaine was injected into the anterior chamber.  Viscoelastic was injected into the anterior chamber.  A temporal clear-corneal main wound incision was created using a 2.9mm microkeratome.  A continuous curvilinear capsulorrhexis was initiated using an irrigating cystitome and completed using capsulorrhexis forceps.  Hydrodissection and hydrodeliniation were performed.  Viscoelastic was injected into the anterior chamber.  A phacoemulsification handpiece and a chopper as a second instrument  were used to remove the nucleus and epinucleus. The irrigation/aspiration handpiece was used to remove any remaining cortical material.   The capsular bag was reinflated with viscoelastic, checked, and found to be intact. The intraocular lens was inserted into the capsular bag and dialed into place using a kuglen hook.  The irrigation/aspiration handpiece was used to remove any remaining viscoelastic.  The clear corneal wound and paracentesis wounds were then hydrated and checked with Weck-Cels to be watertight. Maxitrol drops were placed in the eye. The lid-speculum and drape was removed, and the patient's face was cleaned with a wet and dry 4x4. A clear shield was taped over the eye. The patient was taken to the post-operative care unit in good condition, having tolerated the procedure well.  Post-Op Instructions: The patient will follow up at [facility] for a same day post-operative evaluation and will receive all other orders and instructions.

## 2022-12-23 NOTE — Transfer of Care (Signed)
Immediate Anesthesia Transfer of Care Note  Patient: Emma Casey  Procedure(s) Performed: CATARACT EXTRACTION PHACO AND INTRAOCULAR LENS PLACEMENT (IOC) (Left: Eye)  Patient Location: Short Stay  Anesthesia Type:MAC  Level of Consciousness: awake, alert , and oriented  Airway & Oxygen Therapy: Patient Spontanous Breathing  Post-op Assessment: Report given to RN, Post -op Vital signs reviewed and stable, Patient moving all extremities X 4, and Patient able to stick tongue midline  Post vital signs: Reviewed  Last Vitals:  Vitals Value Taken Time  BP 142/93   Temp 98.8   Pulse 18   Resp 18   SpO2 74     Last Pain:  Vitals:   12/23/22 1113  TempSrc: Oral  PainSc: 0-No pain         Complications: No notable events documented.

## 2022-12-23 NOTE — Anesthesia Preprocedure Evaluation (Signed)
Anesthesia Evaluation  Patient identified by MRN, date of birth, ID band Patient awake    Reviewed: Allergy & Precautions, H&P , NPO status , Patient's Chart, lab work & pertinent test results, reviewed documented beta blocker date and time   Airway Mallampati: II  TM Distance: >3 FB Neck ROM: full    Dental no notable dental hx.    Pulmonary neg pulmonary ROS, shortness of breath, sleep apnea , Patient abstained from smoking., former smoker   Pulmonary exam normal breath sounds clear to auscultation       Cardiovascular Exercise Tolerance: Good hypertension, negative cardio ROS + Valvular Problems/Murmurs  Rhythm:regular Rate:Normal     Neuro/Psych  Headaches PSYCHIATRIC DISORDERS Anxiety Depression    negative neurological ROS  negative psych ROS   GI/Hepatic negative GI ROS, Neg liver ROS,GERD  ,,  Endo/Other  diabetes  Morbid obesity  Renal/GU negative Renal ROS  negative genitourinary   Musculoskeletal   Abdominal   Peds  Hematology negative hematology ROS (+)   Anesthesia Other Findings   Reproductive/Obstetrics negative OB ROS                             Anesthesia Physical Anesthesia Plan  ASA: 3  Anesthesia Plan: MAC   Post-op Pain Management:    Induction:   PONV Risk Score and Plan:   Airway Management Planned:   Additional Equipment:   Intra-op Plan:   Post-operative Plan:   Informed Consent: I have reviewed the patients History and Physical, chart, labs and discussed the procedure including the risks, benefits and alternatives for the proposed anesthesia with the patient or authorized representative who has indicated his/her understanding and acceptance.     Dental Advisory Given  Plan Discussed with: CRNA  Anesthesia Plan Comments:        Anesthesia Quick Evaluation

## 2022-12-25 NOTE — Anesthesia Postprocedure Evaluation (Signed)
Anesthesia Post Note  Patient: Emma Casey  Procedure(s) Performed: CATARACT EXTRACTION PHACO AND INTRAOCULAR LENS PLACEMENT (IOC) (Left: Eye)  Patient location during evaluation: Phase II Anesthesia Type: MAC Level of consciousness: awake Pain management: pain level controlled Vital Signs Assessment: post-procedure vital signs reviewed and stable Respiratory status: spontaneous breathing and respiratory function stable Cardiovascular status: blood pressure returned to baseline and stable Postop Assessment: no headache and no apparent nausea or vomiting Anesthetic complications: no Comments: Late entry   No notable events documented.   Last Vitals:  Vitals:   12/23/22 1113 12/23/22 1321  BP: 126/81 134/77  Pulse: 68 83  Resp: 14 15  Temp: 36.7 C 37.1 C  SpO2: 100% 98%    Last Pain:  Vitals:   12/23/22 1321  TempSrc: Oral  PainSc: 0-No pain                 Windell Norfolk

## 2022-12-27 ENCOUNTER — Encounter (HOSPITAL_COMMUNITY): Payer: Self-pay | Admitting: Ophthalmology

## 2023-01-03 ENCOUNTER — Encounter: Payer: Self-pay | Admitting: Hematology

## 2023-01-04 ENCOUNTER — Other Ambulatory Visit: Payer: Self-pay | Admitting: Hematology and Oncology

## 2023-01-10 ENCOUNTER — Ambulatory Visit (INDEPENDENT_AMBULATORY_CARE_PROVIDER_SITE_OTHER): Payer: Medicare PPO | Admitting: Neurology

## 2023-01-10 ENCOUNTER — Encounter: Payer: Self-pay | Admitting: Neurology

## 2023-01-10 VITALS — BP 127/84 | HR 87 | Ht 66.0 in | Wt 239.2 lb

## 2023-01-10 DIAGNOSIS — G4733 Obstructive sleep apnea (adult) (pediatric): Secondary | ICD-10-CM

## 2023-01-10 DIAGNOSIS — Z9989 Dependence on other enabling machines and devices: Secondary | ICD-10-CM | POA: Diagnosis not present

## 2023-01-10 DIAGNOSIS — Z789 Other specified health status: Secondary | ICD-10-CM

## 2023-01-10 DIAGNOSIS — R400 Somnolence: Secondary | ICD-10-CM | POA: Diagnosis not present

## 2023-01-10 NOTE — Patient Instructions (Signed)
It was nice to see you again today.  As discussed, we will plan a proper sleep study for BiPAP titration overnight in our sleep lab, please start using your current auto BiPAP machine for now.  You should hear from our sleep lab coordinator in the next couple of weeks for scheduling your sleep study.  If you do not hear from [place] in about 2 weeks, please call us back or email [place] through MyChart.  We will plan a follow-up after testing.  I will try to adjust your treatment settings based on the sleep study results.

## 2023-01-10 NOTE — Progress Notes (Signed)
Subjective:    Patient ID: Emma Casey is a 62 y.o. female.  HPI    Interim history:   Ms. Nordine is a 63 year old right-handed woman with an underlying medical history of breast cancer (s/p L sided surgery in 2020, chemotherapy and radiation), history of arthritis, back pain, depression, anxiety, reflux disease, hypertension, hyperlipidemia, lymphedema, prediabetes, and severe obesity with a BMI of over 40, who presents for follow-up consultation of her obstructive sleep apnea, on home autoBiPAP therapy.  The patient is unaccompanied today.  I last saw her on 08/11/2022, at which time we talked about getting her back on BiPAP therapy.  She had stopped using her auto BiPAP because of a bout of bronchitis, on the auto BiPAP machine she had a moderately elevated residual AHI and she was advised to proceed with a laboratory attended sleep study with BiPAP titration.  She was scheduled for March 2024 but did not keep that appointment.  Today, 01/10/2023: She reports that she has not had her BiPAP titration because she has lack of transportation, she also moved in the interim, she also had an insurance change.  She does need a face-to-face visit, she just unpacked her auto BiPAP machine and is motivated to get back on it.  She is willing to restart using her auto BiPAP and come back for her BiPAP titration as we had originally planned at the last visit.  She had cataract surgery on 12/23/2022 with good success, she does have a follow-up appointment with her ophthalmologist.  Everything looks good.  She is still on eyedrops.  Bedtime is generally variable, she may go to bed around 6 PM and watch TV in bed, may fall asleep around 7:30 PM and rise time is around 6 AM.  She lives alone.  She does drive some but will probably ask her niece to drop her for her sleep study.    The patient's allergies, current medications, family history, past medical history, past social history, past surgical history and  problem list were reviewed and updated as appropriate.    Previously:     I first met her at the request of her primary care physician on 03/02/2022, at which time she reported a prior diagnosis of obstructive sleep apnea.  She had severe sleep apnea and sleep testing was several years ago, she had CPAP intolerance.  She was not compliant with her CPAP.  She was advised to proceed with sleep testing.  She had an emergency split study on 03/23/2022 which showed a baseline AHI of 55.8/h, O2 nadir was 72%.  CPAP alone was not sufficient to reduce her severe sleep apnea and she was treated with BiPAP therapy, requiring higher pressures.  Full resolution of her sleep apnea was not achieved during the study.  She was advised to proceed with home auto BiPAP therapy.  Her set up date was 05/19/2022.  She has a Physicist, medical.   I reviewed her auto BiPAP compliance data from 05/19/2022 through 06/17/2022, which is a total of 30 days, during which time she used her machine 26 days with percent use days greater than 4 hours at 80%, indicating very good compliance with an average usage of 7 hours and 51 minutes for days on treatment, residual AHI elevated at 23.5/h, leak on the high side with the 95th percentile at 31.8 L/min, maximum IPAP of 20, minimum EPAP of 8, pressure support 5 cm.  95th percentile of IPAP was 19.9 cm, 95th percentile of  EPAP was 14.9 cm.     03/02/22: (She) was previously diagnosed with obstructive sleep apnea and placed on PAP therapy. I was able to review some of her prior sleep study records. She had a sleep study at [facility] on 09/06/2010, reportedly showing severe obstructive sleep apnea.  She had a titration study at [facility] sleep center on 09/29/2010 which indicated resolution of her sleep disordered breathing with 17 cm of water pressure.  Study was interpreted by Dr. Ninetta Lights. I reviewed your office note from 01/05/2022.  She was unable to tolerate CPAP  therapy at the time. She reports that she tried different types of mask options, most recently nasal pillows by her description.  She has tried using her machine about a month ago, she did not bring her machine for today's visit, a compliance download is not available.   Her Epworth sleepiness score is 22 out of 24, fatigue severity score is 59 out of 63. She lives with her roommate, she is widowed, husband passed away in 01/17/2014, she has 2 stepsons.  She quit smoking yesterday, she does not drink alcohol currently, does not drink caffeine daily.  Bedtime is generally around 9 and rise time around 8.  He has significant nocturia about 6 or 7 times per average night.  She does not have recurrent morning headaches or nocturnal headaches.  Weight has been more or less stable lately.  At the time of her sleep testing in 01/18/2011 she weighed around 293 pounds.  Her highest weight has been around 300 pounds.   Her Past Medical History Is Significant For: Past Medical History:  Diagnosis Date   Anxiety    Arthritis    right hand   Back pain    Breast cancer (HCC)    Bronchitis    Cancer (HCC) 11/2018   left breast ca   Chronic fatigue syndrome    Constipation    Depression    Diabetes mellitus without complication (HCC)    Family history of breast cancer    GERD (gastroesophageal reflux disease)    Headache    Heart murmur    Herpes 1&2    Hyperlipidemia    Hypertension    Joint pain    Leg fracture, left    as a child   Lymphedema    Multiple food allergies    OSA on CPAP    has CPAP but has not used in over a month   Personal history of chemotherapy    Personal history of radiation therapy    Prediabetes    Shortness of breath     Her Past Surgical History Is Significant For: Past Surgical History:  Procedure Laterality Date   ABDOMINAL HYSTERECTOMY     BREAST LUMPECTOMY WITH RADIOACTIVE SEED AND SENTINEL LYMPH NODE BIOPSY Left 01/08/2019   Procedure: LEFT BREAST LUMPECTOMY WITH  RADIOACTIVE SEED AND LEFT AXILLARY DEEP SENTINEL LYMPH NODE BIOPSY WITH BLUE DYE INJECTION;  Surgeon: Claud Kelp, MD;  Location: [facility];  Service: General;  Laterality: Left;   CATARACT EXTRACTION W/PHACO Left 12/23/2022   Procedure: CATARACT EXTRACTION PHACO AND INTRAOCULAR LENS PLACEMENT (IOC);  Surgeon: Fabio Pierce, MD;  Location: [facility];  Service: Ophthalmology;  Laterality: Left;  CDE: 20.14   COLONOSCOPY N/A 09/02/2015   Procedure: COLONOSCOPY;  Surgeon: West Bali, MD;  Location: [facility];  Service: Endoscopy;  Laterality: N/A;  11:15 Am   EYE SURGERY Right    cataract   HAND SURGERY  Right    broken bone   MASS EXCISION Left 10/01/2021   Procedure: EXCISION MASS LEFT PALM;  Surgeon: Cindee Salt, MD;  Location: [facility];  Service: Orthopedics;  Laterality: Left;   PORT-A-CATH REMOVAL Right 08/21/2019   Procedure: REMOVAL PORT-A-CATH;  Surgeon: Claud Kelp, MD;  Location: [facility];  Service: General;  Laterality: Right;   PORTACATH PLACEMENT N/A 03/01/2019   Procedure: INSERTION PORT-A-CATH;  Surgeon: Claud Kelp, MD;  Location: [facility];  Service: General;  Laterality: N/A;   RE-EXCISION OF BREAST LUMPECTOMY Left 03/01/2019   Procedure: RE-EXCISION OF LEFT BREAST LUMPECTOMY MULITPLE MARGINS;  Surgeon: Claud Kelp, MD;  Location: [facility];  Service: General;  Laterality: Left;   TUBAL LIGATION      Her Family History Is Significant For: Family History  Problem Relation Age of Onset   Stroke Mother    Transient ischemic attack Father    Heart attack Father    Hypertension Father    Hyperlipidemia Father    Heart disease Father    Sleep apnea Father    Stroke Brother    Breast cancer Maternal Aunt 50   Breast cancer Cousin        mat first cousin   Breast cancer Cousin        mat first cousin   Cancer Cousin        mat first cousin    Her Social History Is Significant For: Social History    Socioeconomic History   Marital status: Widowed    Spouse name: Not on file   Number of children: Not on file   Years of education: Not on file   Highest education level: Not on file  Occupational History   Occupation: on disability  Tobacco Use   Smoking status: Former    Packs/day: 0.25    Years: 10.00    Additional pack years: 0.00    Total pack years: 2.50    Types: Cigarettes    Quit date: 06/29/2022    Years since quitting: 0.5   Smokeless tobacco: Never   Tobacco comments:    1-2 cigarette per day  Vaping Use   Vaping Use: Never used  Substance and Sexual Activity   Alcohol use: No   Drug use: No   Sexual activity: Not on file  Other Topics Concern   Not on file  Social History Narrative   Caffeine 1 cup coffee once week, soda- Sprite.    Education: 12th grade.   Work disabilty-broke R Merchandiser, retail. CMA work,  L hand csyt removed.  Breast Ca/ lymphedema.    Social Determinants of Health   Financial Resource Strain: Not on file  Food Insecurity: Not on file  Transportation Needs: No Transportation Needs (06/11/2019)   PRAPARE - Administrator, Civil Service (Medical): No    Lack of Transportation (Non-Medical): No  Physical Activity: Not on file  Stress: Not on file  Social Connections: Not on file    Her Allergies Are:  Allergies  Allergen Reactions   Asa [Aspirin] Hives   Augmentin [Amoxicillin-Pot Clavulanate] Hives   Nsaids Hives   Penicillins     Tolerated Ancef 01/08/2019 Has patient had a PCN reaction causing immediate rash, facial/tongue/throat swelling, SOB or lightheadedness with hypotension: Yesyes Has patient had a PCN reaction causing severe rash involving mucus membranes or skin necrosis: Nono Has patient had a PCN reaction that required hospitalization Nono Has patient had a PCN reaction occurring within the  last 10 years: Nono If all of the above answers are "NO", then may proceed with Cephalosporin  :   Her Current Medications  Are:  Outpatient Encounter Medications as of 01/10/2023  Medication Sig   acyclovir (ZOVIRAX) 400 MG tablet Take 400 mg by mouth 2 (two) times daily.   amLODipine (NORVASC) 5 MG tablet Take 5 mg by mouth daily.   Ascorbic Acid (VITAMIN C) 1000 MG tablet Take 1,000 mg by mouth daily.   atorvastatin (LIPITOR) 20 MG tablet Take 20 mg by mouth daily.   diphenhydrAMINE (BENADRYL) 25 MG tablet Take 25 mg by mouth every 6 (six) hours as needed.   fluticasone (FLONASE) 50 MCG/ACT nasal spray Place 2 sprays into both nostrils daily.    folic acid (FOLVITE) 400 MCG tablet Take 400 mcg by mouth daily.    gabapentin (NEURONTIN) 300 MG capsule TAKE 1 CAPSULE BY MOUTH  TWICE DAILY (Patient taking differently: 600mg  in AM and PM, 300mg  at 1200 if needed.)   lactulose (CHRONULAC) 10 GM/15ML solution Take 15 mLs (10 g total) by mouth 2 (two) times daily.   letrozole (FEMARA) 2.5 MG tablet Take 1 tablet (2.5 mg total) by mouth daily.   lisinopril (ZESTRIL) 2.5 MG tablet Take 2.5 mg by mouth at bedtime.   loratadine (CLARITIN) 10 MG tablet Take 10 mg by mouth daily.   metFORMIN (GLUCOPHAGE-XR) 500 MG 24 hr tablet 1,000 mg 2 (two) times daily.   montelukast (SINGULAIR) 10 MG tablet Take 10 mg by mouth at bedtime.   Multiple Vitamin (MULTIVITAMIN) capsule Take 1 capsule by mouth daily.   omeprazole (PRILOSEC) 20 MG capsule Take 20 mg by mouth daily.   PARoxetine (PAXIL) 10 MG tablet Take 10 mg by mouth at bedtime.   potassium chloride (MICRO-K) 10 MEQ CR capsule Take 10 mEq by mouth 2 (two) times daily.   tiZANidine (ZANAFLEX) 4 MG tablet Take 4 mg by mouth 3 (three) times daily.   topiramate (TOPAMAX) 100 MG tablet Take 100 mg by mouth daily.   vitamin B-12 (CYANOCOBALAMIN) 500 MCG tablet Take 500 mcg by mouth daily.    prochlorperazine (COMPAZINE) 10 MG tablet Take 1 tablet (10 mg total) by mouth every 6 (six) hours as needed (Nausea or vomiting).   No facility-administered encounter medications on file as of  01/10/2023.  :  Review of Systems:  Out of a complete 14 point review of systems, all are reviewed and negative with the exception of these symptoms as listed below:  Review of Systems  Neurological:        Here to start process again.  Last seen 07-2022. Due to medicare guidelines, have to have a visit and SS within 6 months.  She did not have bipap titration due to lack of transportation back in 10/2022. She moved since we saw her last and she just unpacked her machine.  She has not been using it. ESS 22.    Objective:  Neurological Exam  Physical Exam Physical Examination:   Vitals:   01/10/23 1036  BP: 127/84  Pulse: 87    General Examination: The patient is a very pleasant 62 y.o. female in no acute distress. She appears well-developed and well-nourished and well groomed.   HEENT: Normocephalic, atraumatic, pupils are equal, round and reactive to light, left eye is mildly irritated, status post cataract repair.  No photophobia.  Extraocular tracking is good without limitation to gaze excursion or nystagmus noted. Hearing is grossly intact. Face is symmetric with normal facial  animation. Speech is clear with no dysarthria noted, but speech is edentulous. There is no hypophonia. There is no lip, neck/head, jaw or voice tremor. Neck is supple with full range of passive and active motion. There are no carotid bruits on auscultation. Oropharynx exam reveals: moderate mouth dryness, edentulous, marked airway crowding. Tongue protrudes centrally.    Chest: Clear to auscultation without wheezing, rhonchi or crackles noted.   Heart: S1+S2+0, regular and normal without murmurs, rubs or gallops noted.    Abdomen: Soft, non-tender and non-distended.   Extremities: There is no pitting edema but puffiness noted in the distal lower extremities bilaterally.    Skin: Warm and dry without trophic changes noted.    Musculoskeletal: exam reveals no obvious joint deformities.    Neurologically:   Mental status: The patient is awake, alert and oriented in all 4 spheres. Her immediate and remote memory, attention, language skills and fund of knowledge are appropriate. There is no evidence of aphasia, agnosia, apraxia or anomia. Speech is clear with normal prosody and enunciation. Thought process is linear. Mood is normal and affect is normal.  Cranial nerves II - XII are as described above under HEENT exam.  Motor exam: Normal bulk, strength and tone is noted. There is no obvious tremor. Fine motor skills and coordination: grossly intact.  Cerebellar testing: No dysmetria or intention tremor. There is no truncal or gait ataxia.  Sensory exam: intact to light touch in the upper and lower extremities.  Gait, station and balance: She stands easily. No veering to one side is noted. No leaning to one side is noted. Posture is age-appropriate and stance is narrow based. Gait shows normal stride length and normal pace. No problems turning are noted.   Assessment and Plan:  In summary, PORCIA BALDONADO is a 62 year old right-handed woman with an underlying medical history of breast cancer (s/p L sided surgery in 2020, chemotherapy and radiation), history of arthritis, back pain, depression, anxiety, reflux disease, hypertension, hyperlipidemia, lymphedema, prediabetes, and severe obesity with a BMI of over 40, who presents for follow-up consultation of her obstructive sleep apnea.  She has just recently moved, she has not restarted using her auto BiPAP but is motivated to do so, she just unpacked her machine after her recent move.  She does report improvement of her sleepiness when she was on auto BiPAP.  While on treatment with auto BiPAP, she still had a moderately elevated residual AHI, mostly secondary to obstructive events with an average pressure of around 20/15 cm.  We had planned for an in lab titration study and she was scheduled for March 2024 but was not able to keep that appointment.  She is  willing to come in for a designated titration study with BiPAP therapy.  I will reorder a BiPAP titration study and we should be able to schedule her hopefully soon.  Of note, in the past she was compliant with her auto BiPAP and is certainly motivated to get back on treatment.  We will plan a follow-up after her BiPAP titration study.  In the interim, she is advised to be consistent with her current AutoBiPAP machine.  She will need a change in her DME provider due to insurance change.  I answered all her questions today and she was in agreement with our plan. I spent 30 minutes in total face-to-face time and in reviewing records during pre-charting, more than 50% of which was spent in counseling and coordination of care, reviewing test results, reviewing medications  and treatment regimen and/or in discussing or reviewing the diagnosis of OSA on biPAP, the prognosis and treatment options. Pertinent laboratory and imaging test results that were available during this visit with the patient were reviewed by me and considered in my medical decision making (see chart for details).

## 2023-01-23 ENCOUNTER — Other Ambulatory Visit: Payer: Self-pay

## 2023-01-23 DIAGNOSIS — Z170 Estrogen receptor positive status [ER+]: Secondary | ICD-10-CM

## 2023-01-23 DIAGNOSIS — C50212 Malignant neoplasm of upper-inner quadrant of left female breast: Secondary | ICD-10-CM

## 2023-01-24 ENCOUNTER — Encounter: Payer: Self-pay | Admitting: Nurse Practitioner

## 2023-01-24 ENCOUNTER — Inpatient Hospital Stay: Payer: Medicare PPO | Attending: Nurse Practitioner | Admitting: Nurse Practitioner

## 2023-01-24 ENCOUNTER — Inpatient Hospital Stay: Payer: Medicare PPO

## 2023-01-24 ENCOUNTER — Other Ambulatory Visit: Payer: Self-pay

## 2023-01-24 VITALS — BP 136/76 | HR 85 | Temp 98.4°F | Resp 15 | Wt 241.1 lb

## 2023-01-24 DIAGNOSIS — I129 Hypertensive chronic kidney disease with stage 1 through stage 4 chronic kidney disease, or unspecified chronic kidney disease: Secondary | ICD-10-CM | POA: Diagnosis not present

## 2023-01-24 DIAGNOSIS — Z79811 Long term (current) use of aromatase inhibitors: Secondary | ICD-10-CM | POA: Insufficient documentation

## 2023-01-24 DIAGNOSIS — Z90710 Acquired absence of both cervix and uterus: Secondary | ICD-10-CM | POA: Diagnosis not present

## 2023-01-24 DIAGNOSIS — N183 Chronic kidney disease, stage 3 unspecified: Secondary | ICD-10-CM | POA: Insufficient documentation

## 2023-01-24 DIAGNOSIS — Z87891 Personal history of nicotine dependence: Secondary | ICD-10-CM | POA: Diagnosis not present

## 2023-01-24 DIAGNOSIS — M8580 Other specified disorders of bone density and structure, unspecified site: Secondary | ICD-10-CM | POA: Insufficient documentation

## 2023-01-24 DIAGNOSIS — Z170 Estrogen receptor positive status [ER+]: Secondary | ICD-10-CM | POA: Diagnosis not present

## 2023-01-24 DIAGNOSIS — E1122 Type 2 diabetes mellitus with diabetic chronic kidney disease: Secondary | ICD-10-CM | POA: Diagnosis not present

## 2023-01-24 DIAGNOSIS — E1140 Type 2 diabetes mellitus with diabetic neuropathy, unspecified: Secondary | ICD-10-CM | POA: Diagnosis not present

## 2023-01-24 DIAGNOSIS — R296 Repeated falls: Secondary | ICD-10-CM | POA: Insufficient documentation

## 2023-01-24 DIAGNOSIS — E1165 Type 2 diabetes mellitus with hyperglycemia: Secondary | ICD-10-CM | POA: Diagnosis not present

## 2023-01-24 DIAGNOSIS — C50212 Malignant neoplasm of upper-inner quadrant of left female breast: Secondary | ICD-10-CM

## 2023-01-24 LAB — CMP (CANCER CENTER ONLY)
ALT: 8 U/L (ref 0–44)
AST: 10 U/L — ABNORMAL LOW (ref 15–41)
Albumin: 4.2 g/dL (ref 3.5–5.0)
Alkaline Phosphatase: 77 U/L (ref 38–126)
Anion gap: 6 (ref 5–15)
BUN: 10 mg/dL (ref 8–23)
CO2: 30 mmol/L (ref 22–32)
Calcium: 9.7 mg/dL (ref 8.9–10.3)
Chloride: 104 mmol/L (ref 98–111)
Creatinine: 1.28 mg/dL — ABNORMAL HIGH (ref 0.44–1.00)
GFR, Estimated: 47 mL/min — ABNORMAL LOW (ref >60)
Glucose, Bld: 240 mg/dL — ABNORMAL HIGH (ref 70–99)
Potassium: 3.3 mmol/L — ABNORMAL LOW (ref 3.5–5.1)
Sodium: 140 mmol/L (ref 135–145)
Total Bilirubin: 0.6 mg/dL (ref 0.3–1.2)
Total Protein: 7.1 g/dL (ref 6.5–8.1)

## 2023-01-24 LAB — CBC WITH DIFFERENTIAL (CANCER CENTER ONLY)
Abs Immature Granulocytes: 0.01 10*3/uL (ref 0.00–0.07)
Basophils Absolute: 0 10*3/uL (ref 0.0–0.1)
Basophils Relative: 0 %
Eosinophils Absolute: 0.1 10*3/uL (ref 0.0–0.5)
Eosinophils Relative: 1 %
HCT: 40.6 % (ref 36.0–46.0)
Hemoglobin: 12.8 g/dL (ref 12.0–15.0)
Immature Granulocytes: 0 %
Lymphocytes Relative: 22 %
Lymphs Abs: 2.1 10*3/uL (ref 0.7–4.0)
MCH: 29.4 pg (ref 26.0–34.0)
MCHC: 31.5 g/dL (ref 30.0–36.0)
MCV: 93.3 fL (ref 80.0–100.0)
Monocytes Absolute: 0.4 10*3/uL (ref 0.1–1.0)
Monocytes Relative: 5 %
Neutro Abs: 6.7 10*3/uL (ref 1.7–7.7)
Neutrophils Relative %: 72 %
Platelet Count: 323 10*3/uL (ref 150–400)
RBC: 4.35 MIL/uL (ref 3.87–5.11)
RDW: 14.3 % (ref 11.5–15.5)
WBC Count: 9.3 10*3/uL (ref 4.0–10.5)
nRBC: 0 % (ref 0.0–0.2)

## 2023-01-24 NOTE — Progress Notes (Signed)
Patient Care Team: Pollyann Samples, NP as PCP - General (Nurse Practitioner) Pershing Proud, RN as Oncology Nurse Navigator Donnelly Angelica, RN as Oncology Nurse Navigator Claud Kelp, MD as Consulting Physician (General Surgery) Malachy Mood, MD as Consulting Physician (Hematology) Dorothy Puffer, MD as Consulting Physician (Radiation Oncology) Pollyann Samples, NP as Nurse Practitioner (Nurse Practitioner)   CHIEF COMPLAINT: Follow up left breast cancer   Oncology History Overview Note  Cancer Staging Malignant neoplasm of upper-inner quadrant of left breast in female, estrogen receptor positive (HCC) Staging form: Breast, AJCC 8th Edition - Clinical stage from 12/17/2018: Stage Unknown (cTX, cN0, cM0, G2, ER+, PR+, HER2-) - Signed by Malachy Mood, MD on 12/25/2018 - Pathologic stage from 01/08/2019: Stage IA (pT1c, pN0, cM0, G3, ER+, PR+, HER2-) - Signed by Malachy Mood, MD on 01/22/2019     Malignant neoplasm of upper-inner quadrant of left breast in female, estrogen receptor positive (HCC)  11/28/2018 Mammogram   Diagnostic Mammogram 11/28/18 IMPRESSION:  Suspcious mass in upper inner quadrant of the left breast  at posterior depth.    12/17/2018 Initial Biopsy   Diagnosis 12/17/18 Breast, left, needle core biopsy, upper inner quadrant - INVASIVE DUCTAL CARCINOMA, SEE COMMENT. - DUCTAL CARCINOMA IN SITU.   12/17/2018 Cancer Staging   Staging form: Breast, AJCC 8th Edition - Clinical stage from 12/17/2018: Stage Unknown (cTX, cN0, cM0, G2, ER+, PR+, HER2-) - Signed by Malachy Mood, MD on 12/25/2018   12/20/2018 Initial Diagnosis   Malignant neoplasm of upper-inner quadrant of left breast in female, estrogen receptor positive (HCC)   12/20/2018 Receptors her2   Results: IMMUNOHISTOCHEMICAL AND MORPHOMETRIC ANALYSIS PERFORMED MANUALLY The tumor cells are NEGATIVE for Her2 (1+). Estrogen Receptor: 95%, POSITIVE, STRONG STAINING INTENSITY Progesterone Receptor: 30%, POSITIVE, STRONG STAINING  INTENSITY Proliferation Marker Ki67: 20%   01/08/2019 Surgery   LEFT BREAST LUMPECTOMY WITH RADIOACTIVE SEED AND LEFT AXILLARY DEEP SENTINEL LYMPH NODE BIOPSY WITH BLUE DYE INJECTION by Dr Derrell Lolling 01/08/19    01/08/2019 Pathology Results   Diagnosis 01/08/19 1. Breast, lumpectomy, left - INVASIVE DUCTAL CARCINOMA, GRADE III/III, SPANNING 1.8 CM. - DUCTAL CARCINOMA IN SITU, HIGH GRADE. - DUCTAL CARCINOMA IN SITU IS FOCALLY PRESENT AT THE ANTERIOR MARGIN OF SPECIMEN #1. - DUCTAL CARCINOMA IN SITU IS FOCALLY LESS THAN 0.1 CM FROM THE INFERIOR MARGIN OF SPECIMEN # 1. - SEE ONCOLOGY TABLE BELOW. 2. Breast, excision, left medial margin - DUCTAL CARCINOMA IN SITU, FOCALLY PRESENT AT THE MEDIAL MARGIN OF SPECIMEN # 2. 3. Breast, excision, left posterior margin - BENIGN BREAST PARENCHYMA. - THERE IS NO EVIDENCE OF MALIGNANCY. - SEE COMMENT. 4. Lymph node, sentinel, biopsy, left axillary - THERE IS NO EVIDENCE OF CARCINOMA IN 1 OF 1 LYMPH NODE (0/1). 5. Lymph node, sentinel, biopsy, left axillary - THERE IS NO EVIDENCE OF CARCINOMA IN 1 OF 1 LYMPH NODE (0/1). 6. Lymph node, sentinel, biopsy, left axillary - THERE IS NO EVIDENCE OF CARCINOMA IN 1 OF 1 LYMPH NODE (0/1)   01/08/2019 Oncotype testing   recurrence score of 27 with risk of recurrence 16% with Tamoxifen or AI alone. There is a benefit of chemotherapy >15%    01/08/2019 Cancer Staging   Staging form: Breast, AJCC 8th Edition - Pathologic stage from 01/08/2019: Stage IA (pT1c, pN0, cM0, G3, ER+, PR+, HER2-) - Signed by Malachy Mood, MD on 01/22/2019   01/29/2019 Genetic Testing   Negative genetic testing on the common hereditary cancer panel.  The Common Hereditary Gene Panel  offered by [facility] includes sequencing and/or deletion duplication testing of the following 48 genes: APC, ATM, AXIN2, BARD1, BMPR1A, BRCA1, BRCA2, BRIP1, CDH1, CDK4, CDKN2A (p14ARF), CDKN2A (p16INK4a), CHEK2, CTNNA1, DICER1, EPCAM (Deletion/duplication testing only), GREM1  (promoter region deletion/duplication testing only), KIT, MEN1, MLH1, MSH2, MSH3, MSH6, MUTYH, NBN, NF1, NHTL1, PALB2, PDGFRA, PMS2, POLD1, POLE, PTEN, RAD50, RAD51C, RAD51D, RNF43, SDHB, SDHC, SDHD, SMAD4, SMARCA4. STK11, TP53, TSC1, TSC2, and VHL.  The following genes were evaluated for sequence changes only: SDHA and HOXB13 c.251G>A variant only. The report date is January 29, 2019.   03/01/2019 Surgery   RE-EXCISION OF LEFT BREAST LUMPECTOMY MULITPLE MARGINS by Dr. Derrell Lolling  Diagnosis 1. Breast, excision, re-excision left anterior margin - BENIGN BREAST PARENCHYMA WITH PREVIOUS PROCEDURE-RELATED CHANGES - NEGATIVE FOR IN SITU OR INVASIVE CARCINOMA 2. 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 SEE NOTE - DCIS IS FOCALLY LESS THAN 1 MM FROM NEW INFERIOR MARGIN - NEGATIVE FOR INVASIVE CARCINOMA - PREVIOUS PROCEDURE-RELATED CHANGES 3. Breast, excision, re-excision left medial margin - FOCAL DUCTAL CARCINOMA IN SITU (DCIS), 0.2 CM. SEE NOTE - DCIS IS 1.5 MM FROM NEW MEDIAL MARGIN - NEGATIVE FOR INVASIVE CARCINOMA - PREVIOUS PROCEDURE-RELATED CHANGES 4. Breast, excision, re-excision left final medial argin - BENIGN BREAST PARENCHYMA WITH MILD FOCAL PREVIOUS PROCEDURE-RELATED CHANGES - NEGATIVE FOR IN SITU OR INVASIVE CARCINOMA    03/22/2019 - 05/24/2019 Chemotherapy   TC q3weeks for 4 cycles starting 03/22/19 and completed on 05/24/19    06/24/2019 - 08/09/2019 Radiation Therapy   Radiation therapy with Dr. Mitzi Hansen 06/24/19-08/09/19   08/2019 -  Anti-estrogen oral therapy   Letrozole 2.5mg  starting 08/2019   10/23/2019 Survivorship   SCP delivered by Santiago Glad, NP       CURRENT THERAPY: Letrozole, starting 08/2019  INTERVAL HISTORY Emma Casey returns for follow up as scheduled. Last seen by me 07/25/22. Mammogram due in July. She continues letrozole, tolerating without significant side effects. Breast pain and neuropathy have "calmed  down." Her only new complaint lately is multiple falls. States she has always had "weak legs." She walks all the time. Knees just give out, not painful. She is working to get a walk in shower. Denies new/worsening bone or joint pain.  Denies urinary issues.   ROS  All other systems reviewed and negative   Past Medical History:  Diagnosis Date   Anxiety    Arthritis    right hand   Back pain    Breast cancer (HCC)    Bronchitis    Cancer (HCC) 11/2018   left breast ca   Chronic fatigue syndrome    Constipation    Depression    Diabetes mellitus without complication (HCC)    Family history of breast cancer    GERD (gastroesophageal reflux disease)    Headache    Heart murmur    Herpes 1&2    Hyperlipidemia    Hypertension    Joint pain    Leg fracture, left    as a child   Lymphedema    Multiple food allergies    OSA on CPAP    has CPAP but has not used in over a month   Personal history of chemotherapy    Personal history of radiation therapy    Prediabetes    Shortness of breath      Past Surgical History:  Procedure Laterality Date   ABDOMINAL HYSTERECTOMY     BREAST LUMPECTOMY WITH RADIOACTIVE SEED AND  SENTINEL LYMPH NODE BIOPSY Left 01/08/2019   Procedure: LEFT BREAST LUMPECTOMY WITH RADIOACTIVE SEED AND LEFT AXILLARY DEEP SENTINEL LYMPH NODE BIOPSY WITH BLUE DYE INJECTION;  Surgeon: Claud Kelp, MD;  Location: [facility];  Service: General;  Laterality: Left;   CATARACT EXTRACTION W/PHACO Left 12/23/2022   Procedure: CATARACT EXTRACTION PHACO AND INTRAOCULAR LENS PLACEMENT (IOC);  Surgeon: Fabio Pierce, MD;  Location: [facility];  Service: Ophthalmology;  Laterality: Left;  CDE: 20.14   COLONOSCOPY N/A 09/02/2015   Procedure: COLONOSCOPY;  Surgeon: West Bali, MD;  Location: [facility];  Service: Endoscopy;  Laterality: N/A;  11:15 Am   EYE SURGERY Right    cataract   HAND SURGERY Right    broken bone   MASS EXCISION Left 10/01/2021    Procedure: EXCISION MASS LEFT PALM;  Surgeon: Cindee Salt, MD;  Location: [facility];  Service: Orthopedics;  Laterality: Left;   PORT-A-CATH REMOVAL Right 08/21/2019   Procedure: REMOVAL PORT-A-CATH;  Surgeon: Claud Kelp, MD;  Location: [facility];  Service: General;  Laterality: Right;   PORTACATH PLACEMENT N/A 03/01/2019   Procedure: INSERTION PORT-A-CATH;  Surgeon: Claud Kelp, MD;  Location: [facility];  Service: General;  Laterality: N/A;   RE-EXCISION OF BREAST LUMPECTOMY Left 03/01/2019   Procedure: RE-EXCISION OF LEFT BREAST LUMPECTOMY MULITPLE MARGINS;  Surgeon: Claud Kelp, MD;  Location: [facility];  Service: General;  Laterality: Left;   TUBAL LIGATION       Outpatient Encounter Medications as of 01/24/2023  Medication Sig   acyclovir (ZOVIRAX) 400 MG tablet Take 400 mg by mouth 2 (two) times daily.   amLODipine (NORVASC) 5 MG tablet Take 5 mg by mouth daily.   Ascorbic Acid (VITAMIN C) 1000 MG tablet Take 1,000 mg by mouth daily.   atorvastatin (LIPITOR) 20 MG tablet Take 20 mg by mouth daily.   diphenhydrAMINE (BENADRYL) 25 MG tablet Take 25 mg by mouth every 6 (six) hours as needed.   fluticasone (FLONASE) 50 MCG/ACT nasal spray Place 2 sprays into both nostrils daily.    folic acid (FOLVITE) 400 MCG tablet Take 400 mcg by mouth daily.    gabapentin (NEURONTIN) 300 MG capsule TAKE 1 CAPSULE BY MOUTH  TWICE DAILY (Patient taking differently: 600mg  in AM and PM, 300mg  at 1200 if needed.)   lactulose (CHRONULAC) 10 GM/15ML solution Take 15 mLs (10 g total) by mouth 2 (two) times daily.   letrozole (FEMARA) 2.5 MG tablet Take 1 tablet (2.5 mg total) by mouth daily.   lisinopril (ZESTRIL) 2.5 MG tablet Take 2.5 mg by mouth at bedtime.   loratadine (CLARITIN) 10 MG tablet Take 10 mg by mouth daily.   metFORMIN (GLUCOPHAGE-XR) 500 MG 24 hr tablet 1,000 mg 2 (two) times daily.   montelukast (SINGULAIR) 10 MG tablet Take 10 mg by mouth at bedtime.    Multiple Vitamin (MULTIVITAMIN) capsule Take 1 capsule by mouth daily.   omeprazole (PRILOSEC) 20 MG capsule Take 20 mg by mouth daily.   PARoxetine (PAXIL) 10 MG tablet Take 10 mg by mouth at bedtime.   potassium chloride (MICRO-K) 10 MEQ CR capsule Take 10 mEq by mouth 2 (two) times daily.   tiZANidine (ZANAFLEX) 4 MG tablet Take 4 mg by mouth 3 (three) times daily.   topiramate (TOPAMAX) 100 MG tablet Take 100 mg by mouth daily.   vitamin B-12 (CYANOCOBALAMIN) 500 MCG tablet Take 500 mcg by mouth daily.    prochlorperazine (COMPAZINE) 10 MG tablet Take  1 tablet (10 mg total) by mouth every 6 (six) hours as needed (Nausea or vomiting).   No facility-administered encounter medications on file as of 01/24/2023.     Today's Vitals   01/24/23 1001 01/24/23 1004  BP: 136/76   Pulse: 85   Resp: 15   Temp: 98.4 F (36.9 C)   TempSrc: Oral   SpO2: 95%   Weight: 241 lb 1.6 oz (109.4 kg)   PainSc:  0-No pain   Body mass index is 38.91 kg/m.   PHYSICAL EXAM GENERAL:alert, no distress and comfortable SKIN: no rash  EYES: sclera clear NECK: without mass LYMPH:  no palpable cervical or supraclavicular lymphadenopathy  LUNGS:  normal breathing effort HEART: no lower extremity edema ABDOMEN: abdomen soft, non-tender and normal bowel sounds NEURO: alert & oriented x 3 with fluent speech Breast exam: no nipple discharge or inversion.  S/p left lumpectomy, incisions completely healed.  No palpable mass or nodularity in either breast or axilla that I could appreciate.  CBC    Component Value Date/Time   WBC 9.3 01/24/2023 0942   WBC 8.8 02/21/2019 0905   RBC 4.35 01/24/2023 0942   HGB 12.8 01/24/2023 0942   HGB 14.0 04/15/2020 1009   HCT 40.6 01/24/2023 0942   HCT 44.4 04/15/2020 1009   PLT 323 01/24/2023 0942   PLT 276 04/15/2020 1009   MCV 93.3 01/24/2023 0942   MCV 94 04/15/2020 1009   MCH 29.4 01/24/2023 0942   MCHC 31.5 01/24/2023 0942   RDW 14.3 01/24/2023 0942   RDW 13.1  04/15/2020 1009   LYMPHSABS 2.1 01/24/2023 0942   LYMPHSABS 1.6 04/15/2020 1009   MONOABS 0.4 01/24/2023 0942   EOSABS 0.1 01/24/2023 0942   EOSABS 0.1 04/15/2020 1009   BASOSABS 0.0 01/24/2023 0942   BASOSABS 0.0 04/15/2020 1009     CMP     Component Value Date/Time   NA 140 01/24/2023 0942   NA 142 04/15/2020 1009   K 3.3 (L) 01/24/2023 0942   CL 104 01/24/2023 0942   CO2 30 01/24/2023 0942   GLUCOSE 240 (H) 01/24/2023 0942   BUN 10 01/24/2023 0942   BUN 7 04/15/2020 1009   CREATININE 1.28 (H) 01/24/2023 0942   CALCIUM 9.7 01/24/2023 0942   PROT 7.1 01/24/2023 0942   PROT 7.4 04/15/2020 1009   ALBUMIN 4.2 01/24/2023 0942   ALBUMIN 4.7 04/15/2020 1009   AST 10 (L) 01/24/2023 0942   ALT 8 01/24/2023 0942   ALKPHOS 77 01/24/2023 0942   BILITOT 0.6 01/24/2023 0942   GFRNONAA 47 (L) 01/24/2023 0942   GFRAA 55 (L) 04/15/2020 1009   GFRAA 50 (L) 01/22/2020 1001     ASSESSMENT & PLAN:Emma Casey is a 62 y.o. female with    1. Malignant neoplasm of upper-inner quadrant of left breast, pT1cN0, stage I, ER/PR+,  HER2-, Grade III, Oncotype RS 27 -Diagnosed in 11/2018. S/p left breast lumpectomy and SLN biopsy on 01/08/19. Given positive margins she underwent left re-excision on 03/01/19. Oncotype showed RS 27, high risk.  -she completed 4 cycles of adjuvant chemotherapy with TC in 05/2019 and adjuvant Radiation.  -she started antiestrogen therapy with letrozole in 08/2019. she is tolerating well  -most recent MM on 03/16/22 was benign -Ms. Dainty is clinically doing well.  Tolerating letrozole.  Breast exam is benign, labs are stable.  Overall no clinical concern for recurrence. -Continue breast cancer surveillance.  Next mammogram in 02/2023, ordered today -Follow-up with me in 6 months,  or sooner if needed   2. Osteopenia -DEXA 12/02/2019 showed lowest T score -2.1, FRAX showed 11.1% risk of major osteoporotic fracture and 1.1% risk of hip fracture in the next 10 years  -DEXA  on 04/13/21 showed improvement in osteopenia (T-score -1.9). -she has not been taking daily calcium and vit D but agrees to due so -Repeat this year with mammo, if BMD is worse or higher FRAX score, will likely switch to tamoxifen due to her frequent fall -I recommend to follow up with ortho for knee weakness   3. HTN, DM with hyperglycemia, Obesity -On Amlodipine, lisinopril, metformin 3 tabs once daily.   -Dr. Mosetta Putt previously referred her to [facility] weight [facility] -Ongoing work to control weight and lower her A1c; previously recommended healthy weight loss clinic and aqua aerobics    4. CKD stage III -secondary to DM and HTN. Will monitor  -Overall stable   5. Smoking Cessation  -She smoked for 10 years before she quit. She restarted after recent cancer diagnosis  -She was able to quit smoking 06/29/2022   6. Genetic Testing was negative for pathogenetic mutations   7. B/l leg pain, neuropathy of feet, G1 -Likely secondary to chemo and DM -Overall improved some, takes gabapentin 2-3 times daily  -Knees are weak but denies pain today   PLAN: -Labs reviewed -Continue breast cancer surveillance and letrozole -Mammogram and DEXA in July, if BMD is worse or higher FRAX score will likely change to tamoxifen due to her multiple falls - I will call with results -Follow-up with PCP, Ortho, and routine medical team for ongoing health care -F/up in 6 months, or sooner if needed  Orders Placed This Encounter  Procedures   MM DIAG BREAST TOMO BILATERAL    Standing Status:   Future    Standing Expiration Date:   01/24/2024    Order Specific Question:   Reason for Exam (SYMPTOM  OR DIAGNOSIS REQUIRED)    Answer:   L breast cancer s/p lumpectomy, chemo, RT, AI    Order Specific Question:   Preferred imaging location?    Answer:   [facility]   DG Bone Density    Standing Status:   Future    Standing Expiration Date:   01/24/2024    Order Specific Question:   Reason for  Exam (SYMPTOM  OR DIAGNOSIS REQUIRED)    Answer:   osteopenia, multiple falls, on AI    Order Specific Question:   Preferred imaging location?    Answer:   [facility]      All questions were answered. The patient knows to call the clinic with any problems, questions or concerns. No barriers to learning were detected. I spent 20 minutes counseling the patient face to face. The total time spent in the appointment was 30 minutes and more than 50% was on counseling, review of test results, and coordination of care.   Santiago Glad, NP-C 01/24/2023

## 2023-01-31 ENCOUNTER — Telehealth: Payer: Self-pay | Admitting: Neurology

## 2023-01-31 NOTE — Telephone Encounter (Signed)
BiPAP- [employer]: [phone] (exp. 01/25/23 to 04/25/23)   Patient is scheduled at [facility] for 04/18/23 at 9 pm.  Mailed packet to the patient.

## 2023-02-28 ENCOUNTER — Ambulatory Visit: Payer: Medicare Other | Admitting: Plastic Surgery

## 2023-04-18 NOTE — Telephone Encounter (Signed)
Patient called and had to cancel due to the lady bringing her tonight was not able to bring her tonight. She stated she will call back to r/s.

## 2023-07-13 NOTE — Telephone Encounter (Signed)
Telephone call  

## 2023-07-23 NOTE — Progress Notes (Unsigned)
Patient Care Team: Pollyann Samples, NP as PCP - General (Nurse Practitioner) Pershing Proud, RN as Oncology Nurse Navigator Donnelly Angelica, RN as Oncology Nurse Navigator Claud Kelp, MD as Consulting Physician (General Surgery) Malachy Mood, MD as Consulting Physician (Hematology) Dorothy Puffer, MD as Consulting Physician (Radiation Oncology) Pollyann Samples, NP as Nurse Practitioner (Nurse Practitioner)  Clinic Day:  07/26/2023  Referring physician: Pollyann Samples, NP  ASSESSMENT & PLAN:   Assessment & Plan: Malignant neoplasm of upper-inner quadrant of left breast in female, estrogen receptor positive (HCC)  pT1cN0, stage I, ER/PR+,  HER2-, Grade III, Oncotype RS 27 -Diagnosed in 11/2018. S/p left breast lumpectomy and SLN biopsy on 01/08/19. Given positive margins she underwent left re-excision on 03/01/19. Oncotype showed RS 27, high risk.  -she completed 4 cycles of adjuvant chemotherapy with TC in 05/2019 and adjuvant Radiation.  -she started antiestrogen therapy with letrozole in 08/2019. she is tolerating well  -most recent MM on 03/16/22 was benign -Emma Casey is clinically doing well.  Tolerating letrozole.  Breast exam is benign, labs are stable.  Overall no clinical concern for recurrence. -Continue breast cancer surveillance.  Next mammogram in 02/2023, ordered today -Follow-up with me in 6 months, or sooner if needed    Plan: Labs reviewed  -CBC showing WBC 7.7; Hgb 14.1; Hct 45.3; Plt 303; Anc 5.8 -CMP - K 3.6; glucose 213; BUN 7; Creatinine 1.23; eGFR 50; Ca 10.0; LFTs normal.   She is scheduled for diagnostic mammogram and bone density test later this month.  Tolerating letrozole well with some nausea still. Refilled prescription for compazine today.  She will be 4 years with anti-estrogen therapy next month  Continue breast cancer surveillance.  Labs with follow up in 6 months.   The patient understands the plans discussed today and is in agreement with them.   She knows to contact our office if she develops concerns prior to her next appointment.  I provided 25 minutes of face-to-face time during this encounter and > 50% was spent counseling as documented under my assessment and plan.    Emma Jews, NP  [facility] [address] [place] [address] Dept: [phone] Dept Fax: [phone]   No orders of the defined types were placed in this encounter.     CHIEF COMPLAINT:  CC: Follow-up left breast cancer.  Current Treatment: Letrozole starting 08/2019  INTERVAL HISTORY:  Emma Casey is here today for repeat clinical assessment.  Last saw Clayborn Heron, NP on 01/24/2023.  Reports some nausea when taking letrozole. Using compazine makes this tolerable. She takes OTC imodium when needed for diarrhea. She states that her primary care recently diagnosed her with asthma and started her on albuterol inhaler. She states she is taking 2 inhalations every 4 hours. Reviewed instructions with patient. Albuterol inhaler is every 4 hours as needed. The patient states that she did not understand that this was "as needed" medication. She denies chest pain, chest pressure, or shortness of breath. She denies headaches or visual disturbances. She denies abdominal pain, nausea, vomiting, or changes in bowel or bladder habits.   Her appetite is fair. Her weight has increased 6 pounds over last 6 months .  I have reviewed the past medical history, past surgical history, social history and family history with the patient and they are unchanged from previous note.  ALLERGIES:  is allergic to asa [aspirin], augmentin [  amoxicillin-pot clavulanate], nsaids, and penicillins.  MEDICATIONS:  Current Outpatient Medications  Medication Sig Dispense Refill   acyclovir (ZOVIRAX) 400 MG tablet Take 400 mg by mouth 2 (two) times daily.     albuterol (VENTOLIN HFA) 108 (90 Base) MCG/ACT inhaler  Inhale 2 puffs into the lungs every 4 (four) hours as needed.     amLODipine (NORVASC) 5 MG tablet Take 5 mg by mouth daily.     Ascorbic Acid (VITAMIN C) 1000 MG tablet Take 1,000 mg by mouth daily.     atorvastatin (LIPITOR) 20 MG tablet Take 20 mg by mouth daily.     diphenhydrAMINE (BENADRYL) 25 MG tablet Take 25 mg by mouth every 6 (six) hours as needed.     fluticasone (FLONASE) 50 MCG/ACT nasal spray Place 2 sprays into both nostrils daily.      folic acid (FOLVITE) 400 MCG tablet Take 400 mcg by mouth daily.      gabapentin (NEURONTIN) 300 MG capsule TAKE 1 CAPSULE BY MOUTH  TWICE DAILY (Patient taking differently: 600mg  in AM and PM, 300mg  at 1200 if needed.) 180 capsule 3   lactulose (CHRONULAC) 10 GM/15ML solution Take 15 mLs (10 g total) by mouth 2 (two) times daily. 236 mL 0   letrozole (FEMARA) 2.5 MG tablet Take 1 tablet (2.5 mg total) by mouth daily. 100 tablet 2   lisinopril (ZESTRIL) 2.5 MG tablet Take 2.5 mg by mouth at bedtime.     loratadine (CLARITIN) 10 MG tablet Take 10 mg by mouth daily.     metFORMIN (GLUCOPHAGE-XR) 500 MG 24 hr tablet 1,000 mg 2 (two) times daily.     montelukast (SINGULAIR) 10 MG tablet Take 10 mg by mouth at bedtime.     Multiple Vitamin (MULTIVITAMIN) capsule Take 1 capsule by mouth daily.     omeprazole (PRILOSEC) 20 MG capsule Take 20 mg by mouth daily.     PARoxetine (PAXIL) 10 MG tablet Take 10 mg by mouth at bedtime.     potassium chloride (MICRO-K) 10 MEQ CR capsule Take 10 mEq by mouth 2 (two) times daily.     tiZANidine (ZANAFLEX) 4 MG tablet Take 4 mg by mouth 3 (three) times daily.     topiramate (TOPAMAX) 100 MG tablet Take 100 mg by mouth daily.     vitamin B-12 (CYANOCOBALAMIN) 500 MCG tablet Take 500 mcg by mouth daily.      prochlorperazine (COMPAZINE) 10 MG tablet Take 1 tablet (10 mg total) by mouth every 6 (six) hours as needed (Nausea or vomiting). 30 tablet 3   No current facility-administered medications for this visit.     HISTORY OF PRESENT ILLNESS:   Oncology History Overview Note  Cancer Staging Malignant neoplasm of upper-inner quadrant of left breast in female, estrogen receptor positive (HCC) Staging form: Breast, AJCC 8th Edition - Clinical stage from 12/17/2018: Stage Unknown (cTX, cN0, cM0, G2, ER+, PR+, HER2-) - Signed by Malachy Mood, MD on 12/25/2018 - Pathologic stage from 01/08/2019: Stage IA (pT1c, pN0, cM0, G3, ER+, PR+, HER2-) - Signed by Malachy Mood, MD on 01/22/2019     Malignant neoplasm of upper-inner quadrant of left breast in female, estrogen receptor positive (HCC)  11/28/2018 Mammogram   Diagnostic Mammogram 11/28/18 IMPRESSION:  Suspcious mass in upper inner quadrant of the left breast  at posterior depth.    12/17/2018 Initial Biopsy   Diagnosis 12/17/18 Breast, left, needle core biopsy, upper inner quadrant - INVASIVE DUCTAL CARCINOMA, SEE COMMENT. - DUCTAL CARCINOMA IN SITU.  12/17/2018 Cancer Staging   Staging form: Breast, AJCC 8th Edition - Clinical stage from 12/17/2018: Stage Unknown (cTX, cN0, cM0, G2, ER+, PR+, HER2-) - Signed by Malachy Mood, MD on 12/25/2018   12/20/2018 Initial Diagnosis   Malignant neoplasm of upper-inner quadrant of left breast in female, estrogen receptor positive (HCC)   12/20/2018 Receptors her2   Results: IMMUNOHISTOCHEMICAL AND MORPHOMETRIC ANALYSIS PERFORMED MANUALLY The tumor cells are NEGATIVE for Her2 (1+). Estrogen Receptor: 95%, POSITIVE, STRONG STAINING INTENSITY Progesterone Receptor: 30%, POSITIVE, STRONG STAINING INTENSITY Proliferation Marker Ki67: 20%   01/08/2019 Surgery   LEFT BREAST LUMPECTOMY WITH RADIOACTIVE SEED AND LEFT AXILLARY DEEP SENTINEL LYMPH NODE BIOPSY WITH BLUE DYE INJECTION by Dr Derrell Lolling 01/08/19    01/08/2019 Pathology Results   Diagnosis 01/08/19 1. Breast, lumpectomy, left - INVASIVE DUCTAL CARCINOMA, GRADE III/III, SPANNING 1.8 CM. - DUCTAL CARCINOMA IN SITU, HIGH GRADE. - DUCTAL CARCINOMA IN SITU IS FOCALLY PRESENT  AT THE ANTERIOR MARGIN OF SPECIMEN #1. - DUCTAL CARCINOMA IN SITU IS FOCALLY LESS THAN 0.1 CM FROM THE INFERIOR MARGIN OF SPECIMEN # 1. - SEE ONCOLOGY TABLE BELOW. 2. Breast, excision, left medial margin - DUCTAL CARCINOMA IN SITU, FOCALLY PRESENT AT THE MEDIAL MARGIN OF SPECIMEN # 2. 3. Breast, excision, left posterior margin - BENIGN BREAST PARENCHYMA. - THERE IS NO EVIDENCE OF MALIGNANCY. - SEE COMMENT. 4. Lymph node, sentinel, biopsy, left axillary - THERE IS NO EVIDENCE OF CARCINOMA IN 1 OF 1 LYMPH NODE (0/1). 5. Lymph node, sentinel, biopsy, left axillary - THERE IS NO EVIDENCE OF CARCINOMA IN 1 OF 1 LYMPH NODE (0/1). 6. Lymph node, sentinel, biopsy, left axillary - THERE IS NO EVIDENCE OF CARCINOMA IN 1 OF 1 LYMPH NODE (0/1)   01/08/2019 Oncotype testing   recurrence score of 27 with risk of recurrence 16% with Tamoxifen or AI alone. There is a benefit of chemotherapy >15%    01/08/2019 Cancer Staging   Staging form: Breast, AJCC 8th Edition - Pathologic stage from 01/08/2019: Stage IA (pT1c, pN0, cM0, G3, ER+, PR+, HER2-) - Signed by Malachy Mood, MD on 01/22/2019   01/29/2019 Genetic Testing   Negative genetic testing on the common hereditary cancer panel.  The Common Hereditary Gene Panel offered by [facility] includes sequencing and/or deletion duplication testing of the following 48 genes: APC, ATM, AXIN2, BARD1, BMPR1A, BRCA1, BRCA2, BRIP1, CDH1, CDK4, CDKN2A (p14ARF), CDKN2A (p16INK4a), CHEK2, CTNNA1, DICER1, EPCAM (Deletion/duplication testing only), GREM1 (promoter region deletion/duplication testing only), KIT, MEN1, MLH1, MSH2, MSH3, MSH6, MUTYH, NBN, NF1, NHTL1, PALB2, PDGFRA, PMS2, POLD1, POLE, PTEN, RAD50, RAD51C, RAD51D, RNF43, SDHB, SDHC, SDHD, SMAD4, SMARCA4. STK11, TP53, TSC1, TSC2, and VHL.  The following genes were evaluated for sequence changes only: SDHA and HOXB13 c.251G>A variant only. The report date is January 29, 2019.   03/01/2019 Surgery   RE-EXCISION OF LEFT BREAST  LUMPECTOMY MULITPLE MARGINS by Dr. Derrell Lolling  Diagnosis 1. Breast, excision, re-excision left anterior margin - BENIGN BREAST PARENCHYMA WITH PREVIOUS PROCEDURE-RELATED CHANGES - NEGATIVE FOR IN SITU OR INVASIVE CARCINOMA 2. 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 SEE NOTE - DCIS IS FOCALLY LESS THAN 1 MM FROM NEW INFERIOR MARGIN - NEGATIVE FOR INVASIVE CARCINOMA - PREVIOUS PROCEDURE-RELATED CHANGES 3. Breast, excision, re-excision left medial margin - FOCAL DUCTAL CARCINOMA IN SITU (DCIS), 0.2 CM. SEE NOTE - DCIS IS 1.5 MM FROM NEW MEDIAL MARGIN - NEGATIVE FOR INVASIVE CARCINOMA - PREVIOUS PROCEDURE-RELATED CHANGES 4. Breast, excision, re-excision left final  medial argin - BENIGN BREAST PARENCHYMA WITH MILD FOCAL PREVIOUS PROCEDURE-RELATED CHANGES - NEGATIVE FOR IN SITU OR INVASIVE CARCINOMA    03/22/2019 - 05/24/2019 Chemotherapy   TC q3weeks for 4 cycles starting 03/22/19 and completed on 05/24/19    06/24/2019 - 08/09/2019 Radiation Therapy   Radiation therapy with Dr. Mitzi Hansen 06/24/19-08/09/19   08/2019 -  Anti-estrogen oral therapy   Letrozole 2.5mg  starting 08/2019   10/23/2019 Survivorship   SCP delivered by Santiago Glad, NP        REVIEW OF SYSTEMS:   Constitutional: Denies fevers, chills or abnormal weight loss Eyes: Denies blurriness of vision Ears, nose, mouth, throat, and face: Denies mucositis or sore throat Respiratory: Denies cough, dyspnea or wheezes Cardiovascular: Denies palpitation, chest discomfort or lower extremity swelling Gastrointestinal:  she has nausea and diarrhea which she associates with taking letrozole. Prescription compazine relieves nausea. Taking imodium helps diarrhea. Skin: Denies abnormal skin rashes Lymphatics: Denies new lymphadenopathy or easy bruising Neurological:Denies numbness, tingling or new weaknesses Behavioral/Psych: Mood is stable, no new changes  All other systems were  reviewed with the patient and are negative.   VITALS:   Today's Vitals   07/26/23 0931 07/26/23 0935  BP: (!) 167/81 (!) 142/80  Pulse: 74   Resp: 16   Temp: (!) 97.5 F (36.4 C)   TempSrc: Temporal   SpO2: 98%   Weight: 247 lb 12.8 oz (112.4 kg)   PainSc:  0-No pain   Body mass index is 40 kg/m.   Wt Readings from Last 3 Encounters:  07/26/23 247 lb 12.8 oz (112.4 kg)  01/24/23 241 lb 1.6 oz (109.4 kg)  01/10/23 239 lb 3.2 oz (108.5 kg)    Body mass index is 40 kg/m.  Performance status (ECOG): 1 - Symptomatic but completely ambulatory  PHYSICAL EXAM:   GENERAL:alert, no distress and comfortable SKIN: skin color, texture, turgor are normal, no rashes or significant lesions EYES: normal, Conjunctiva are pink and non-injected, sclera clear OROPHARYNX:no exudate, no erythema and lips, buccal mucosa, and tongue normal  NECK: supple, thyroid normal size, non-tender, without nodularity LYMPH:  no palpable lymphadenopathy in the cervical, axillary or inguinal LUNGS: clear to auscultation and percussion with normal breathing effort HEART: regular rate & rhythm and no murmurs and no lower extremity edema ABDOMEN:abdomen soft, non-tender and normal bowel sounds Musculoskeletal:no cyanosis of digits and no clubbing  NEURO: alert & oriented x 3 with fluent speech, no focal motor/sensory deficits BREASTS: right breast is without mass or palpable abnormality. The left breast has well healed horizontal lumpectomy scar in upper aspect of the breast. There are no lumps or masses appreciated in the left breast today. There is no axillary lymphadenopathy bilaterally   LABORATORY DATA:  I have reviewed the data as listed    Component Value Date/Time   NA 140 07/26/2023 0902   NA 142 04/15/2020 1009   K 3.6 07/26/2023 0902   CL 100 07/26/2023 0902   CO2 34 (H) 07/26/2023 0902   GLUCOSE 213 (H) 07/26/2023 0902   BUN 7 (L) 07/26/2023 0902   BUN 7 04/15/2020 1009   CREATININE 1.23  (H) 07/26/2023 0902   CALCIUM 10.0 07/26/2023 0902   PROT 7.1 07/26/2023 0902   PROT 7.4 04/15/2020 1009   ALBUMIN 4.3 07/26/2023 0902   ALBUMIN 4.7 04/15/2020 1009   AST 9 (L) 07/26/2023 0902   ALT 7 07/26/2023 0902   ALKPHOS 83 07/26/2023 0902   BILITOT 0.7 07/26/2023 0902   GFRNONAA 50 (L)  07/26/2023 0902   GFRAA 55 (L) 04/15/2020 1009   GFRAA 50 (L) 01/22/2020 1001     Lab Results  Component Value Date   WBC 7.7 07/26/2023   NEUTROABS 5.3 07/26/2023   HGB 14.1 07/26/2023   HCT 45.3 07/26/2023   MCV 95.4 07/26/2023   PLT 303 07/26/2023

## 2023-07-23 NOTE — Assessment & Plan Note (Signed)
pT1cN0, stage I, ER/PR+,  HER2-, Grade III, Oncotype RS 27 -Diagnosed in 11/2018. S/p left breast lumpectomy and SLN biopsy on 01/08/19. Given positive margins she underwent left re-excision on 03/01/19. Oncotype showed RS 27, high risk.  -she completed 4 cycles of adjuvant chemotherapy with TC in 05/2019 and adjuvant Radiation.  -she started antiestrogen therapy with letrozole in 08/2019. she is tolerating well  -most recent MM on 03/16/22 was benign -Emma Casey is clinically doing well.  Tolerating letrozole.  Breast exam is benign, labs are stable.  Overall no clinical concern for recurrence. -Continue breast cancer surveillance.  Next mammogram in 02/2023, ordered today -Follow-up with me in 6 months, or sooner if needed

## 2023-07-26 ENCOUNTER — Encounter: Payer: Self-pay | Admitting: Nurse Practitioner

## 2023-07-26 ENCOUNTER — Inpatient Hospital Stay: Payer: Medicare PPO

## 2023-07-26 ENCOUNTER — Inpatient Hospital Stay: Payer: Medicare PPO | Attending: Nurse Practitioner | Admitting: Nurse Practitioner

## 2023-07-26 VITALS — BP 142/80 | HR 74 | Temp 97.5°F | Resp 16 | Wt 247.8 lb

## 2023-07-26 DIAGNOSIS — Z923 Personal history of irradiation: Secondary | ICD-10-CM | POA: Insufficient documentation

## 2023-07-26 DIAGNOSIS — Z9221 Personal history of antineoplastic chemotherapy: Secondary | ICD-10-CM | POA: Insufficient documentation

## 2023-07-26 DIAGNOSIS — Z170 Estrogen receptor positive status [ER+]: Secondary | ICD-10-CM | POA: Insufficient documentation

## 2023-07-26 DIAGNOSIS — Z1732 Human epidermal growth factor receptor 2 negative status: Secondary | ICD-10-CM | POA: Diagnosis not present

## 2023-07-26 DIAGNOSIS — Z79811 Long term (current) use of aromatase inhibitors: Secondary | ICD-10-CM | POA: Diagnosis not present

## 2023-07-26 DIAGNOSIS — Z1721 Progesterone receptor positive status: Secondary | ICD-10-CM | POA: Diagnosis not present

## 2023-07-26 DIAGNOSIS — C50212 Malignant neoplasm of upper-inner quadrant of left female breast: Secondary | ICD-10-CM

## 2023-07-26 LAB — CMP (CANCER CENTER ONLY)
ALT: 7 U/L (ref 0–44)
AST: 9 U/L — ABNORMAL LOW (ref 15–41)
Albumin: 4.3 g/dL (ref 3.5–5.0)
Alkaline Phosphatase: 83 U/L (ref 38–126)
Anion gap: 6 (ref 5–15)
BUN: 7 mg/dL — ABNORMAL LOW (ref 8–23)
CO2: 34 mmol/L — ABNORMAL HIGH (ref 22–32)
Calcium: 10 mg/dL (ref 8.9–10.3)
Chloride: 100 mmol/L (ref 98–111)
Creatinine: 1.23 mg/dL — ABNORMAL HIGH (ref 0.44–1.00)
GFR, Estimated: 50 mL/min — ABNORMAL LOW (ref >60)
Glucose, Bld: 213 mg/dL — ABNORMAL HIGH (ref 70–99)
Potassium: 3.6 mmol/L (ref 3.5–5.1)
Sodium: 140 mmol/L (ref 135–145)
Total Bilirubin: 0.7 mg/dL (ref <1.2)
Total Protein: 7.1 g/dL (ref 6.5–8.1)

## 2023-07-26 LAB — CBC WITH DIFFERENTIAL (CANCER CENTER ONLY)
Abs Immature Granulocytes: 0.02 10*3/uL (ref 0.00–0.07)
Basophils Absolute: 0 10*3/uL (ref 0.0–0.1)
Basophils Relative: 0 %
Eosinophils Absolute: 0.1 10*3/uL (ref 0.0–0.5)
Eosinophils Relative: 2 %
HCT: 45.3 % (ref 36.0–46.0)
Hemoglobin: 14.1 g/dL (ref 12.0–15.0)
Immature Granulocytes: 0 %
Lymphocytes Relative: 24 %
Lymphs Abs: 1.9 10*3/uL (ref 0.7–4.0)
MCH: 29.7 pg (ref 26.0–34.0)
MCHC: 31.1 g/dL (ref 30.0–36.0)
MCV: 95.4 fL (ref 80.0–100.0)
Monocytes Absolute: 0.4 10*3/uL (ref 0.1–1.0)
Monocytes Relative: 5 %
Neutro Abs: 5.3 10*3/uL (ref 1.7–7.7)
Neutrophils Relative %: 69 %
Platelet Count: 303 10*3/uL (ref 150–400)
RBC: 4.75 MIL/uL (ref 3.87–5.11)
RDW: 13.6 % (ref 11.5–15.5)
WBC Count: 7.7 10*3/uL (ref 4.0–10.5)
nRBC: 0 % (ref 0.0–0.2)

## 2023-07-26 MED ORDER — PROCHLORPERAZINE MALEATE 10 MG PO TABS
10.0000 mg | ORAL_TABLET | Freq: Four times a day (QID) | ORAL | 3 refills | Status: AC | PRN
Start: 2023-07-26 — End: 2023-10-24

## 2023-08-14 ENCOUNTER — Inpatient Hospital Stay
Admission: RE | Admit: 2023-08-14 | Discharge: 2023-08-14 | Disposition: A | Discharge status: Home / Self Care | Payer: Medicare PPO | Source: Ambulatory Visit | Attending: Nurse Practitioner

## 2023-08-14 ENCOUNTER — Ambulatory Visit
Admission: RE | Admit: 2023-08-14 | Discharge: 2023-08-14 | Disposition: A | Discharge status: Home / Self Care | Payer: Medicare PPO | Source: Ambulatory Visit | Attending: Nurse Practitioner | Admitting: Nurse Practitioner

## 2023-08-14 DIAGNOSIS — N958 Other specified menopausal and perimenopausal disorders: Secondary | ICD-10-CM | POA: Diagnosis not present

## 2023-08-14 DIAGNOSIS — Z170 Estrogen receptor positive status [ER+]: Secondary | ICD-10-CM

## 2023-08-14 DIAGNOSIS — M069 Rheumatoid arthritis, unspecified: Secondary | ICD-10-CM | POA: Diagnosis not present

## 2023-08-14 DIAGNOSIS — E2839 Other primary ovarian failure: Secondary | ICD-10-CM | POA: Diagnosis not present

## 2023-08-14 DIAGNOSIS — M8588 Other specified disorders of bone density and structure, other site: Secondary | ICD-10-CM | POA: Diagnosis not present

## 2023-08-14 DIAGNOSIS — Z853 Personal history of malignant neoplasm of breast: Secondary | ICD-10-CM | POA: Diagnosis not present

## 2023-08-14 DIAGNOSIS — C50212 Malignant neoplasm of upper-inner quadrant of left female breast: Secondary | ICD-10-CM | POA: Diagnosis not present

## 2023-09-05 ENCOUNTER — Telehealth: Payer: Self-pay

## 2023-09-05 NOTE — Telephone Encounter (Addendum)
 Called patient to relay message below as per Powell Lessen NP, Patient voiced full understanding with no further questions or concerns at this time.   ----- Message from Powell FORBES Lessen sent at 09/04/2023  1:41 PM EST ----- Please let the patient know that her recent mammogram was benign. Her bone density test showed low bone mass, but not osteoporosis. I recommend she take OTC vitamin d  2000 international units daily and calcium (600 mg daily). She should stay active w ith low impact exercises. This will be repeated in about 2 years. Thank you so much.  Karyle, NP

## 2023-09-09 ENCOUNTER — Other Ambulatory Visit: Payer: Self-pay | Admitting: Nurse Practitioner

## 2023-12-19 NOTE — Progress Notes (Signed)
 Patient came to mobile screening at [facility] in [place]. Blood pressure elevated. Discussed maintaining healthy blood pressure and lifestyle. NO SDOH needs indicated currently .

## 2024-01-18 ENCOUNTER — Other Ambulatory Visit: Payer: Self-pay

## 2024-01-18 MED ORDER — LETROZOLE 2.5 MG PO TABS: 2.5000 mg | ORAL_TABLET | Freq: Every day | ORAL | 3 refills | Status: AC

## 2024-01-23 ENCOUNTER — Other Ambulatory Visit: Payer: Self-pay | Admitting: Nurse Practitioner

## 2024-01-23 DIAGNOSIS — Z170 Estrogen receptor positive status [ER+]: Secondary | ICD-10-CM

## 2024-01-23 NOTE — Assessment & Plan Note (Addendum)
 pT1cN0, stage I, ER/PR+,  HER2-, Grade III, Oncotype RS 27 -Diagnosed in 11/2018. S/p left breast lumpectomy and SLN biopsy on 01/08/19. Given positive margins she underwent left re-excision on 03/01/19. Oncotype showed RS 27, high risk.  -she completed 4 cycles of adjuvant chemotherapy with TC in 05/2019 and adjuvant Radiation.  -she started antiestrogen therapy with letrozole  in 08/2019. she is tolerating well  -most recent MM on 03/16/22 was benign -Ms. Easterwood is clinically doing well.  Tolerating letrozole .  Breast exam is benign, labs are stable.  Overall no clinical concern for recurrence. - 3D diagnostic mammogram done 08/14/2023. Results were benign. -DEXA scan done 08/14/2023.  She is considered to be osteopenic with low bone mass.  Not currently on bisphosphonates. - Repeat 3D diagnostic mammogram in December 2025. -Continue breast cancer surveillance.   Labs and follow-up in 6 months, sooner if needed.

## 2024-01-23 NOTE — Progress Notes (Signed)
 Patient Care Team: Burton, [name] K, NP as PCP - General (Nurse Practitioner) Auther Bo, RN as Oncology Nurse Navigator Alane Hsu, RN as Oncology Nurse Navigator Boyce Byes, MD as Consulting Physician (General Surgery) Sonja Mine [place], MD as Consulting Physician (Hematology) Johna Myers, MD as Consulting Physician (Radiation Oncology) Burton, [name] K, NP as Nurse Practitioner (Nurse Practitioner)  Clinic Day:  01/24/2024  Referring physician: Burton, [name] K, NP  ASSESSMENT & PLAN:   Assessment & Plan: Malignant neoplasm of upper-inner quadrant of left breast in female, estrogen receptor positive (HCC)  pT1cN0, stage I, ER/PR+,  HER2-, Grade III, Oncotype RS 27 -Diagnosed in 11/2018. S/p left breast lumpectomy and SLN biopsy on 01/08/19. Given positive margins she underwent left re-excision on 03/01/19. Oncotype showed RS 27, high risk.  -she completed 4 cycles of adjuvant chemotherapy with TC in 05/2019 and adjuvant Radiation.  -she started antiestrogen therapy with letrozole  in 08/2019. she is tolerating well  -most recent MM on 03/16/22 was benign -Ms. Azizi is clinically doing well.  Tolerating letrozole .  Breast exam is benign, labs are stable.  Overall no clinical concern for recurrence. - 3D diagnostic mammogram done 08/14/2023. Results were benign. -DEXA scan done 08/14/2023.  She is considered to be osteopenic with low bone mass.  Not currently on bisphosphonates. - Repeat 3D diagnostic mammogram in December 2025. -Continue breast cancer surveillance.   Labs and follow-up in 6 months, sooner if needed.   Peripheral neuropathy Continue to take gabapentin  300 mg.  She is taking 600 mg morning and 300 mg in the afternoon.  Dosing is adequate.  No changes.  Recommend continued active lifestyle.  Recommend trial of B complex vitamin to help with neuropathy.  She is agreeable.  Osteopenia Reviewed recent DEXA scan results which show osteopenia/low bone density.  Continues  to take calcium with vitamin D  every day.  Has very active lifestyle and participates in low impact exercise.  Repeat DEXA scan in December 2026.  Plan Reviewed labs. -CBC unremarkable.  Mild hypokalemia with potassium of 3.3.  Hyperglycemia with blood sugar at 296.  Stable CKD. 3D diagnostic mammogram on 08/14/2023 was benign.  Repeat expected December 2025.  Order placed today. Reviewed DEXA scan from 08/14/2023 showing osteopenia/low bone density.  Recommend continued calcium and vitamin D  daily and participation in low impact exercise.  New DEXA scan in December 2026. Continue letrozole  2.5 mg daily.  Will refill as needed. Labs and follow-up in 6 months, sooner if needed.    The patient understands the plans discussed today and is in agreement with them.  She knows to contact our office if she develops concerns prior to her next appointment.  I provided 25 minutes of face-to-face time during this encounter and > 50% was spent counseling as documented under my assessment and plan.    Sharyon Deis, NP  [facility] - A DEPT OF [facility] [address] [place] [address] Dept: [phone] Dept Fax: [phone]   Orders Placed This Encounter  Procedures   MM 3D DIAGNOSTIC MAMMOGRAM BILATERAL BREAST    [employer] [facility];  08/14/2023@[facility]  NO NEED. NO ISSUES. YES HX OF BR CANCER. NO IMPLANTS. NO REDUCTIONS SPOKE WITH    Standing Status:   Future    Expected Date:   07/25/2024    Expiration Date:   01/23/2025    Scheduling Instructions:     Should be on or after 08/14/2024.  Reason for Exam (SYMPTOM  OR DIAGNOSIS REQUIRED):   breast cancer follow up    Preferred imaging location?:   [facility]      CHIEF COMPLAINT:  CC: Left breast cancer, estrogen receptor positive  Current Treatment: Letrozole  daily started January 2021 (goal of 7 to 10 years)  INTERVAL HISTORY:  Aliene is here today for repeat clinical  assessment.  She was last seen by me on 07/26/2023.  Most recent diagnostic mammogram done 08/14/2023.  The result was benign.  She had DEXA scan done, also 08/14/2023.  She is considered to be osteopenic with low bone mass.  She is not currently taking medication such as bisphosphonate to prevent osteoporosis.  She does take calcium and vitamin D  every day and is very active, participating in low impact exercise on daily basis.  She has baseline neuropathy for which she takes gabapentin .  Dosing is adequate. She denies chest pain, chest pressure, or shortness of breath. She denies headaches or visual disturbances. She denies abdominal pain, nausea, vomiting, or changes in bowel or bladder habits.  She denies fevers or chills. She denies pain. Her appetite is good. Her weight has been stable.  I have reviewed the past medical history, past surgical history, social history and family history with the patient and they are unchanged from previous note.  ALLERGIES:  is allergic to asa [aspirin], augmentin [amoxicillin-pot clavulanate], nsaids, and penicillins.  MEDICATIONS:  Current Outpatient Medications  Medication Sig Dispense Refill   acyclovir (ZOVIRAX) 400 MG tablet Take 400 mg by mouth 2 (two) times daily.     albuterol (VENTOLIN HFA) 108 (90 Base) MCG/ACT inhaler Inhale 2 puffs into the lungs every 4 (four) hours as needed.     amLODipine (NORVASC) 5 MG tablet Take 5 mg by mouth daily.     Ascorbic Acid (VITAMIN C) 1000 MG tablet Take 1,000 mg by mouth daily.     atorvastatin (LIPITOR) 20 MG tablet Take 20 mg by mouth daily.     diphenhydrAMINE  (BENADRYL ) 25 MG tablet Take 25 mg by mouth every 6 (six) hours as needed.     fluticasone (FLONASE) 50 MCG/ACT nasal spray Place 2 sprays into both nostrils daily.      folic acid (FOLVITE) 400 MCG tablet Take 400 mcg by mouth daily.      gabapentin  (NEURONTIN ) 300 MG capsule TAKE 1 CAPSULE BY MOUTH  TWICE DAILY (Patient taking differently: 600mg  in AM  and PM, 300mg  at 1200 if needed.) 180 capsule 3   lactulose  (CHRONULAC ) 10 GM/15ML solution Take 15 mLs (10 g total) by mouth 2 (two) times daily. 236 mL 0   letrozole  (FEMARA ) 2.5 MG tablet Take 1 tablet (2.5 mg total) by mouth daily. 90 tablet 3   lisinopril  (ZESTRIL ) 2.5 MG tablet Take 2.5 mg by mouth at bedtime.     loratadine (CLARITIN) 10 MG tablet Take 10 mg by mouth daily.     metFORMIN (GLUCOPHAGE-XR) 500 MG 24 hr tablet 1,000 mg 2 (two) times daily.     montelukast  (SINGULAIR ) 10 MG tablet Take 10 mg by mouth at bedtime.     Multiple Vitamin (MULTIVITAMIN) capsule Take 1 capsule by mouth daily.     omeprazole (PRILOSEC) 20 MG capsule Take 20 mg by mouth daily.     PARoxetine (PAXIL) 10 MG tablet Take 10 mg by mouth at bedtime.     potassium chloride (MICRO-K) 10 MEQ CR capsule Take 10 mEq by mouth 2 (two) times daily.  tiZANidine (ZANAFLEX) 4 MG tablet Take 4 mg by mouth 3 (three) times daily.     topiramate (TOPAMAX) 100 MG tablet Take 100 mg by mouth daily.     vitamin B-12 (CYANOCOBALAMIN) 500 MCG tablet Take 500 mcg by mouth daily.      prochlorperazine  (COMPAZINE ) 10 MG tablet Take 1 tablet (10 mg total) by mouth every 6 (six) hours as needed (Nausea or vomiting). 45 tablet 3   No current facility-administered medications for this visit.    HISTORY OF PRESENT ILLNESS:   Oncology History Overview Note  Cancer Staging Malignant neoplasm of upper-inner quadrant of left breast in female, estrogen receptor positive (HCC) Staging form: Breast, AJCC 8th Edition - Clinical stage from 12/17/2018: Stage Unknown (cTX, cN0, cM0, G2, ER+, PR+, HER2-) - Signed by Sonja Dana, MD on 12/25/2018 - Pathologic stage from 01/08/2019: Stage IA (pT1c, pN0, cM0, G3, ER+, PR+, HER2-) - Signed by Sonja Mineral, MD on 01/22/2019     Malignant neoplasm of upper-inner quadrant of left breast in female, estrogen receptor positive (HCC)  11/28/2018 Mammogram   Diagnostic Mammogram 11/28/18 IMPRESSION:   Suspcious mass in upper inner quadrant of the left breast  at posterior depth.    12/17/2018 Initial Biopsy   Diagnosis 12/17/18 Breast, left, needle core biopsy, upper inner quadrant - INVASIVE DUCTAL CARCINOMA, SEE COMMENT. - DUCTAL CARCINOMA IN SITU.   12/17/2018 Cancer Staging   Staging form: Breast, AJCC 8th Edition - Clinical stage from 12/17/2018: Stage Unknown (cTX, cN0, cM0, G2, ER+, PR+, HER2-) - Signed by Sonja Spelter, MD on 12/25/2018   12/20/2018 Initial Diagnosis   Malignant neoplasm of upper-inner quadrant of left breast in female, estrogen receptor positive (HCC)   12/20/2018 Receptors her2   Results: IMMUNOHISTOCHEMICAL AND MORPHOMETRIC ANALYSIS PERFORMED MANUALLY The tumor cells are NEGATIVE for Her2 (1+). Estrogen Receptor: 95%, POSITIVE, STRONG STAINING INTENSITY Progesterone Receptor: 30%, POSITIVE, STRONG STAINING INTENSITY Proliferation Marker Ki67: 20%   01/08/2019 Surgery   LEFT BREAST LUMPECTOMY WITH RADIOACTIVE SEED AND LEFT AXILLARY DEEP SENTINEL LYMPH NODE BIOPSY WITH BLUE DYE INJECTION by Dr Lauralee Poll 01/08/19    01/08/2019 Pathology Results   Diagnosis 01/08/19 1. Breast, lumpectomy, left - INVASIVE DUCTAL CARCINOMA, GRADE III/III, SPANNING 1.8 CM. - DUCTAL CARCINOMA IN SITU, HIGH GRADE. - DUCTAL CARCINOMA IN SITU IS FOCALLY PRESENT AT THE ANTERIOR MARGIN OF SPECIMEN #1. - DUCTAL CARCINOMA IN SITU IS FOCALLY LESS THAN 0.1 CM FROM THE INFERIOR MARGIN OF SPECIMEN # 1. - SEE ONCOLOGY TABLE BELOW. 2. Breast, excision, left medial margin - DUCTAL CARCINOMA IN SITU, FOCALLY PRESENT AT THE MEDIAL MARGIN OF SPECIMEN # 2. 3. Breast, excision, left posterior margin - BENIGN BREAST PARENCHYMA. - THERE IS NO EVIDENCE OF MALIGNANCY. - SEE COMMENT. 4. Lymph node, sentinel, biopsy, left axillary - THERE IS NO EVIDENCE OF CARCINOMA IN 1 OF 1 LYMPH NODE (0/1). 5. Lymph node, sentinel, biopsy, left axillary - THERE IS NO EVIDENCE OF CARCINOMA IN 1 OF 1 LYMPH NODE (0/1). 6.  Lymph node, sentinel, biopsy, left axillary - THERE IS NO EVIDENCE OF CARCINOMA IN 1 OF 1 LYMPH NODE (0/1)   01/08/2019 Oncotype testing   recurrence score of 27 with risk of recurrence 16% with Tamoxifen or AI alone. There is a benefit of chemotherapy >15%    01/08/2019 Cancer Staging   Staging form: Breast, AJCC 8th Edition - Pathologic stage from 01/08/2019: Stage IA (pT1c, pN0, cM0, G3, ER+, PR+, HER2-) - Signed by Sonja [place], MD on 01/22/2019  01/29/2019 Genetic Testing   Negative genetic testing on the common hereditary cancer panel.  The Common Hereditary Gene Panel offered by [facility] includes sequencing and/or deletion duplication testing of the following 48 genes: APC, ATM, AXIN2, BARD1, BMPR1A, BRCA1, BRCA2, BRIP1, CDH1, CDK4, CDKN2A (p14ARF), CDKN2A (p16INK4a), CHEK2, CTNNA1, DICER1, EPCAM (Deletion/duplication testing only), GREM1 (promoter region deletion/duplication testing only), KIT, MEN1, MLH1, MSH2, MSH3, MSH6, MUTYH, NBN, NF1, NHTL1, PALB2, PDGFRA, PMS2, POLD1, POLE, PTEN, RAD50, RAD51C, RAD51D, RNF43, SDHB, SDHC, SDHD, SMAD4, SMARCA4. STK11, TP53, TSC1, TSC2, and VHL.  The following genes were evaluated for sequence changes only: SDHA and HOXB13 c.251G>A variant only. The report date is January 29, 2019.   03/01/2019 Surgery   RE-EXCISION OF LEFT BREAST LUMPECTOMY MULITPLE MARGINS by Dr. Lauralee Poll  Diagnosis 1. Breast, excision, re-excision left anterior margin - BENIGN BREAST PARENCHYMA WITH PREVIOUS PROCEDURE-RELATED CHANGES - NEGATIVE FOR IN SITU OR INVASIVE CARCINOMA 2. 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 SEE NOTE - DCIS IS FOCALLY LESS THAN 1 MM FROM NEW INFERIOR MARGIN - NEGATIVE FOR INVASIVE CARCINOMA - PREVIOUS PROCEDURE-RELATED CHANGES 3. Breast, excision, re-excision left medial margin - FOCAL DUCTAL CARCINOMA IN SITU (DCIS), 0.2 CM. SEE NOTE - DCIS IS 1.5 MM FROM NEW MEDIAL MARGIN - NEGATIVE FOR INVASIVE  CARCINOMA - PREVIOUS PROCEDURE-RELATED CHANGES 4. Breast, excision, re-excision left final medial argin - BENIGN BREAST PARENCHYMA WITH MILD FOCAL PREVIOUS PROCEDURE-RELATED CHANGES - NEGATIVE FOR IN SITU OR INVASIVE CARCINOMA    03/22/2019 - 05/24/2019 Chemotherapy   TC q3weeks for 4 cycles starting 03/22/19 and completed on 05/24/19    06/24/2019 - 08/09/2019 Radiation Therapy   Radiation therapy with Dr. Jeryl Moris 06/24/19-08/09/19   08/2019 -  Anti-estrogen oral therapy   Letrozole  2.5mg  starting 08/2019   10/23/2019 Survivorship   SCP delivered by [name] Burton, NP        REVIEW OF SYSTEMS:   Constitutional: Denies fevers, chills or abnormal weight loss Eyes: Denies blurriness of vision Ears, nose, mouth, throat, and face: Denies mucositis or sore throat Respiratory: Denies cough, dyspnea or wheezes Cardiovascular: Denies palpitation, chest discomfort or lower extremity swelling Gastrointestinal:  Denies nausea, heartburn or change in bowel habits Skin: Denies abnormal skin rashes Lymphatics: Denies new lymphadenopathy or easy bruising Neurological:Denies numbness, tingling or new weaknesses Behavioral/Psych: Mood is stable, no new changes  All other systems were reviewed with the patient and are negative.   VITALS:   Today's Vitals   01/24/24 0929 01/24/24 0954  BP: 138/84   Pulse: 72   Resp: 17   Temp: 97.6 F (36.4 C)   SpO2: 98%   Weight: 247 lb 3.2 oz (112.1 kg)   PainSc:  0-No pain   Body mass index is 39.9 kg/m.    Wt Readings from Last 3 Encounters:  01/24/24 247 lb 3.2 oz (112.1 kg)  07/26/23 247 lb 12.8 oz (112.4 kg)  01/24/23 241 lb 1.6 oz (109.4 kg)    Body mass index is 39.9 kg/m.  Performance status (ECOG): 1 - Symptomatic but completely ambulatory  PHYSICAL EXAM:   GENERAL:alert, no distress and comfortable SKIN: skin color, texture, turgor are normal, no rashes or significant lesions EYES: normal, Conjunctiva are pink and non-injected, sclera  clear OROPHARYNX:no exudate, no erythema and lips, buccal mucosa, and tongue normal  NECK: supple, thyroid normal size, non-tender, without nodularity LYMPH:  no palpable lymphadenopathy in the cervical, axillary or inguinal LUNGS: clear to auscultation and percussion with normal breathing  effort HEART: regular rate & rhythm and no murmurs and no lower extremity edema ABDOMEN:abdomen soft, non-tender and normal bowel sounds Musculoskeletal:no cyanosis of digits and no clubbing  NEURO: alert & oriented x 3 with fluent speech, no focal motor/sensory deficits BREAST: The left breast has a well-healed, horizontal lumpectomy scar.  There are no appreciated lumps or masses today.  There is left axillary scar which is well-healed.  There is no axillary lymphadenopathy on the left.  The right breast is without palpable lumps or masses today.  There is no nipple inversion or nipple discharge.  There is no axillary lymphadenopathy on the right.  LABORATORY DATA:  I have reviewed the data as listed    Component Value Date/Time   NA 140 01/24/2024 0856   NA 142 04/15/2020 1009   K 3.3 (L) 01/24/2024 0856   CL 101 01/24/2024 0856   CO2 34 (H) 01/24/2024 0856   GLUCOSE 296 (H) 01/24/2024 0856   BUN 9 01/24/2024 0856   BUN 7 04/15/2020 1009   CREATININE 1.29 (H) 01/24/2024 0856   CALCIUM 9.2 01/24/2024 0856   PROT 6.8 01/24/2024 0856   PROT 7.4 04/15/2020 1009   ALBUMIN 4.1 01/24/2024 0856   ALBUMIN 4.7 04/15/2020 1009   AST 9 (L) 01/24/2024 0856   ALT 7 01/24/2024 0856   ALKPHOS 96 01/24/2024 0856   BILITOT 0.9 01/24/2024 0856   GFRNONAA 47 (L) 01/24/2024 0856   GFRAA 55 (L) 04/15/2020 1009   GFRAA 50 (L) 01/22/2020 1001    Lab Results  Component Value Date   WBC 7.0 01/24/2024   NEUTROABS 5.0 01/24/2024   HGB 13.3 01/24/2024   HCT 42.1 01/24/2024   MCV 91.1 01/24/2024   PLT 267 01/24/2024

## 2024-01-24 ENCOUNTER — Inpatient Hospital Stay: Payer: Medicare PPO | Attending: Nurse Practitioner

## 2024-01-24 ENCOUNTER — Inpatient Hospital Stay: Payer: Medicare PPO | Admitting: Nurse Practitioner

## 2024-01-24 VITALS — BP 138/84 | HR 72 | Temp 97.6°F | Resp 17 | Wt 247.2 lb

## 2024-01-24 DIAGNOSIS — C50212 Malignant neoplasm of upper-inner quadrant of left female breast: Secondary | ICD-10-CM | POA: Insufficient documentation

## 2024-01-24 DIAGNOSIS — M8580 Other specified disorders of bone density and structure, unspecified site: Secondary | ICD-10-CM | POA: Diagnosis not present

## 2024-01-24 DIAGNOSIS — Z170 Estrogen receptor positive status [ER+]: Secondary | ICD-10-CM

## 2024-01-24 DIAGNOSIS — Z9221 Personal history of antineoplastic chemotherapy: Secondary | ICD-10-CM | POA: Diagnosis not present

## 2024-01-24 DIAGNOSIS — Z79811 Long term (current) use of aromatase inhibitors: Secondary | ICD-10-CM | POA: Diagnosis not present

## 2024-01-24 DIAGNOSIS — Z1732 Human epidermal growth factor receptor 2 negative status: Secondary | ICD-10-CM | POA: Diagnosis not present

## 2024-01-24 DIAGNOSIS — Z923 Personal history of irradiation: Secondary | ICD-10-CM | POA: Diagnosis not present

## 2024-01-24 DIAGNOSIS — G629 Polyneuropathy, unspecified: Secondary | ICD-10-CM | POA: Diagnosis not present

## 2024-01-24 DIAGNOSIS — Z1721 Progesterone receptor positive status: Secondary | ICD-10-CM | POA: Diagnosis not present

## 2024-01-24 LAB — CMP (CANCER CENTER ONLY)
ALT: 7 U/L (ref 0–44)
AST: 9 U/L — ABNORMAL LOW (ref 15–41)
Albumin: 4.1 g/dL (ref 3.5–5.0)
Alkaline Phosphatase: 96 U/L (ref 38–126)
Anion gap: 5 (ref 5–15)
BUN: 9 mg/dL (ref 8–23)
CO2: 34 mmol/L — ABNORMAL HIGH (ref 22–32)
Calcium: 9.2 mg/dL (ref 8.9–10.3)
Chloride: 101 mmol/L (ref 98–111)
Creatinine: 1.29 mg/dL — ABNORMAL HIGH (ref 0.44–1.00)
GFR, Estimated: 47 mL/min — ABNORMAL LOW (ref >60)
Glucose, Bld: 296 mg/dL — ABNORMAL HIGH (ref 70–99)
Potassium: 3.3 mmol/L — ABNORMAL LOW (ref 3.5–5.1)
Sodium: 140 mmol/L (ref 135–145)
Total Bilirubin: 0.9 mg/dL (ref 0.0–1.2)
Total Protein: 6.8 g/dL (ref 6.5–8.1)

## 2024-01-24 LAB — CBC WITH DIFFERENTIAL (CANCER CENTER ONLY)
Abs Immature Granulocytes: 0.02 10*3/uL (ref 0.00–0.07)
Basophils Absolute: 0 10*3/uL (ref 0.0–0.1)
Basophils Relative: 0 %
Eosinophils Absolute: 0.1 10*3/uL (ref 0.0–0.5)
Eosinophils Relative: 1 %
HCT: 42.1 % (ref 36.0–46.0)
Hemoglobin: 13.3 g/dL (ref 12.0–15.0)
Immature Granulocytes: 0 %
Lymphocytes Relative: 22 %
Lymphs Abs: 1.5 10*3/uL (ref 0.7–4.0)
MCH: 28.8 pg (ref 26.0–34.0)
MCHC: 31.6 g/dL (ref 30.0–36.0)
MCV: 91.1 fL (ref 80.0–100.0)
Monocytes Absolute: 0.3 10*3/uL (ref 0.1–1.0)
Monocytes Relative: 4 %
Neutro Abs: 5 10*3/uL (ref 1.7–7.7)
Neutrophils Relative %: 73 %
Platelet Count: 267 10*3/uL (ref 150–400)
RBC: 4.62 MIL/uL (ref 3.87–5.11)
RDW: 14.6 % (ref 11.5–15.5)
WBC Count: 7 10*3/uL (ref 4.0–10.5)
nRBC: 0 % (ref 0.0–0.2)

## 2024-02-04 ENCOUNTER — Encounter: Payer: Self-pay | Admitting: Nurse Practitioner

## 2024-03-12 NOTE — Progress Notes (Addendum)
 The patient attended a screening event on 12/16/2023 where her BP screening results was 131/102 . At the event the patient noted she has [employer] and does not smoke. At the event patient indicated she lives with someone who smokes. Patient did not have any SDOH insecurities at the event. Pt list pcp as Dr. Lanny Callander. At the screening event pt was instructed to maintain a healthy blood pressure and lifestyle.   Per chart review pt pcp is Dr. Lauraine Lander, NP at [facility]. Patient last office visit was on 02/08/2024. On 02/08/2024 pt was seen for diabetes. Patient BP was 138/80 on 02/08/2024. According to chart review pt is currently on amlodipine to manage blood pressure. Chart review also indicates pt is being seen by Dr. Lanny Callander at [facility] where she is receiving care from a team of physicians and Nurse. Per chart review pt indicated food and transportation insecurities on 02/08/2024 in encounter but there is no SDOH needs indicated at  at screening event. Further review show that [facility] health CHW follow up with pt recently and a referral was made from the CHW for SDOH needs. Letter sent with blood pressure resources in case needed by patient. No additional Health equity team support indicated at this time.

## 2024-08-20 ENCOUNTER — Ambulatory Visit
Admission: RE | Admit: 2024-08-20 | Discharge: 2024-08-20 | Disposition: A | Discharge status: Home / Self Care | Source: Ambulatory Visit | Attending: Nurse Practitioner | Admitting: Nurse Practitioner

## 2024-08-20 ENCOUNTER — Encounter

## 2024-08-20 DIAGNOSIS — Z170 Estrogen receptor positive status [ER+]: Secondary | ICD-10-CM

## 2024-08-28 ENCOUNTER — Other Ambulatory Visit: Payer: Self-pay

## 2024-08-28 DIAGNOSIS — C50212 Malignant neoplasm of upper-inner quadrant of left female breast: Secondary | ICD-10-CM

## 2024-08-28 NOTE — Assessment & Plan Note (Addendum)
"   pT1cN0, stage I, ER/PR+,  HER2-, Grade III, Oncotype RS 27 -Diagnosed in 11/2018. S/p left breast lumpectomy and SLN biopsy on 01/08/19. Given positive margins she underwent left re-excision on 03/01/19. Oncotype showed RS 27, high risk.  -she completed 4 cycles of adjuvant chemotherapy with TC in 05/2019 and adjuvant Radiation.  -she started antiestrogen therapy with letrozole  in 08/2019. she is tolerating well  -most recent MM on 03/16/22 was benign -Emma Casey is clinically doing well.  Tolerating letrozole .  Breast exam is benign, labs are stable.  Overall no clinical concern for recurrence. - 3D diagnostic mammogram done 08/14/2023. Results were benign. -DEXA scan done 08/14/2023.  She is considered to be osteopenic with low bone mass.  Not currently on bisphosphonates. - 3D diagnostic mammogram on 08/20/2024 with benign.  She does have category C breast density.  Repeat in December 2026.  -Continue letrozole  daily. -Plan for labs with follow-up in 6 months, sooner if needed. "

## 2024-08-28 NOTE — Progress Notes (Signed)
 " Patient Care Team: Burton, [name] K, NP as PCP - General (Nurse Practitioner) Tyree Nanetta SAILOR, RN as Oncology Nurse Navigator Gail Favorite, MD as Consulting Physician (General Surgery) Lanny Callander, MD as Consulting Physician (Hematology) Dewey Rush, MD as Consulting Physician (Radiation Oncology) Burton, [name] K, NP as Nurse Practitioner (Nurse Practitioner)  Clinic Day:  08/29/2024  Referring physician: Burton, [name] K, NP  ASSESSMENT & PLAN:   Assessment & Plan: Malignant neoplasm of upper-inner quadrant of left breast in female, estrogen receptor positive (HCC)  pT1cN0, stage I, ER/PR+,  HER2-, Grade III, Oncotype RS 27 -Diagnosed in 11/2018. S/p left breast lumpectomy and SLN biopsy on 01/08/19. Given positive margins she underwent left re-excision on 03/01/19. Oncotype showed RS 27, high risk.  -she completed 4 cycles of adjuvant chemotherapy with TC in 05/2019 and adjuvant Radiation.  -she started antiestrogen therapy with letrozole  in 08/2019. she is tolerating well  -most recent MM on 03/16/22 was benign -Emma Casey is clinically doing well.  Tolerating letrozole .  Breast exam is benign, labs are stable.  Overall no clinical concern for recurrence. - 3D diagnostic mammogram done 08/14/2023. Results were benign. -DEXA scan done 08/14/2023.  She is considered to be osteopenic with low bone mass.  Not currently on bisphosphonates. - 3D diagnostic mammogram on 08/20/2024 with benign.  She does have category C breast density.  Repeat in December 2026.  -Continue letrozole  daily. -Plan for labs with follow-up in 6 months, sooner if needed.    Bone health Most recent DEXA scan done 08/14/2023.  She is considered osteopenic/low bone mass.  FRAX 10-year probability of osteoporotic fracture based on DEXA scan is 5.9% with probability of hip fracture at 1.0%.  Left breast cancer, ER + She continues letrozole  daily without negative side effects.  3D diagnostic mammogram from December  2025 was benign.  She denies changes, lumps, or masses in either breast.  Breast cancer surveillance with labs and follow-up.  6 months, sooner if needed.  Plan Labs reviewed. - CBC unremarkable - Stable renal function. Recent diagnostic mammogram benign.  Repeat in December 2026. Continue letrozole  daily. Plan for labs and follow-up in 6 months, sooner if needed.  The patient understands the plans discussed today and is in agreement with them.  She knows to contact our office if she develops concerns prior to her next appointment.  I provided 25 minutes of face-to-face time during this encounter and > 50% was spent counseling as documented under my assessment and plan.    Emma FORBES Lessen, NP  [facility] - A DEPT OF [facility] [address] [place] [address] Dept: [phone] Dept Fax: [phone]   No orders of the defined types were placed in this encounter.     CHIEF COMPLAINT:  CC: Left breast cancer, ER +  Current Treatment: Letrozole  daily (started 08/2019)  INTERVAL HISTORY:  Delainie is here today for repeat clinical assessment.  She last saw me on 07/26/2023.  She had 3D diagnostic mammogram on 08/20/2024.  There is no evidence of malignancy involving either breast.  She has breast density category C.  Mammogram results were benign.  She continues on letrozole  daily with minimal negative side effects.  She does have significant peripheral neuropathy since treatment of chemotherapy.  She takes gabapentin  and muscle relaxer with symptoms.  She does have fatigue, especially with exertion.,  She also has trouble falling and staying asleep.  She denies chest  pain, chest pressure, or shortness of breath. She denies headaches or visual disturbances. She denies abdominal pain, nausea, vomiting, or changes in bowel or bladder habits.   She denies fevers or chills. She denies pain. Her appetite is good. Her weight  has been stable.  I have reviewed the past medical history, past surgical history, social history and family history with the patient and they are unchanged from previous note.  ALLERGIES:  is allergic to asa [aspirin], augmentin [amoxicillin-pot clavulanate], nsaids, and penicillins.  MEDICATIONS:  Current Outpatient Medications  Medication Sig Dispense Refill   acyclovir (ZOVIRAX) 400 MG tablet Take 400 mg by mouth 2 (two) times daily.     albuterol (VENTOLIN HFA) 108 (90 Base) MCG/ACT inhaler Inhale 2 puffs into the lungs every 4 (four) hours as needed.     amLODipine (NORVASC) 5 MG tablet Take 5 mg by mouth daily.     Ascorbic Acid (VITAMIN C) 1000 MG tablet Take 1,000 mg by mouth daily.     atorvastatin (LIPITOR) 20 MG tablet Take 20 mg by mouth daily.     diphenhydrAMINE  (BENADRYL ) 25 MG tablet Take 25 mg by mouth every 6 (six) hours as needed.     fluticasone (FLONASE) 50 MCG/ACT nasal spray Place 2 sprays into both nostrils daily.      folic acid (FOLVITE) 400 MCG tablet Take 400 mcg by mouth daily.      gabapentin  (NEURONTIN ) 300 MG capsule TAKE 1 CAPSULE BY MOUTH  TWICE DAILY (Patient taking differently: 600mg  in AM and PM, 300mg  at 1200 if needed.) 180 capsule 3   lactulose  (CHRONULAC ) 10 GM/15ML solution Take 15 mLs (10 g total) by mouth 2 (two) times daily. 236 mL 0   letrozole  (FEMARA ) 2.5 MG tablet Take 1 tablet (2.5 mg total) by mouth daily. 90 tablet 3   lisinopril (ZESTRIL) 2.5 MG tablet Take 2.5 mg by mouth at bedtime.     loratadine (CLARITIN) 10 MG tablet Take 10 mg by mouth daily.     metFORMIN (GLUCOPHAGE-XR) 500 MG 24 hr tablet 1,000 mg 2 (two) times daily.     montelukast (SINGULAIR) 10 MG tablet Take 10 mg by mouth at bedtime.     Multiple Vitamin (MULTIVITAMIN) capsule Take 1 capsule by mouth daily.     omeprazole (PRILOSEC) 20 MG capsule Take 20 mg by mouth daily.     PARoxetine (PAXIL) 10 MG tablet Take 10 mg by mouth at bedtime.     potassium chloride  (MICRO-K) 10 MEQ CR capsule Take 10 mEq by mouth 2 (two) times daily.     tiZANidine (ZANAFLEX) 4 MG tablet Take 4 mg by mouth 3 (three) times daily.     topiramate (TOPAMAX) 100 MG tablet Take 100 mg by mouth daily.     vitamin B-12 (CYANOCOBALAMIN) 500 MCG tablet Take 500 mcg by mouth daily.      prochlorperazine  (COMPAZINE ) 10 MG tablet Take 1 tablet (10 mg total) by mouth every 6 (six) hours as needed (Nausea or vomiting). (Patient not taking: Reported on 08/29/2024) 45 tablet 3   No current facility-administered medications for this visit.    HISTORY OF PRESENT ILLNESS:   Oncology History Overview Note  Cancer Staging Malignant neoplasm of upper-inner quadrant of left breast in female, estrogen receptor positive (HCC) Staging form: Breast, AJCC 8th Edition - Clinical stage from 12/17/2018: Stage Unknown (cTX, cN0, cM0, G2, ER+, PR+, HER2-) - Signed by Lanny Callander, MD on 12/25/2018 - Pathologic stage from 01/08/2019: Stage IA (  pT1c, pN0, cM0, G3, ER+, PR+, HER2-) - Signed by Lanny Callander, MD on 01/22/2019     Malignant neoplasm of upper-inner quadrant of left breast in female, estrogen receptor positive (HCC)  11/28/2018 Mammogram   Diagnostic Mammogram 11/28/18 IMPRESSION:  Suspcious mass in upper inner quadrant of the left breast  at posterior depth.    12/17/2018 Initial Biopsy   Diagnosis 12/17/18 Breast, left, needle core biopsy, upper inner quadrant - INVASIVE DUCTAL CARCINOMA, SEE COMMENT. - DUCTAL CARCINOMA IN SITU.   12/17/2018 Cancer Staging   Staging form: Breast, AJCC 8th Edition - Clinical stage from 12/17/2018: Stage Unknown (cTX, cN0, cM0, G2, ER+, PR+, HER2-) - Signed by Lanny Callander, MD on 12/25/2018   12/20/2018 Initial Diagnosis   Malignant neoplasm of upper-inner quadrant of left breast in female, estrogen receptor positive (HCC)   12/20/2018 Receptors her2   Results: IMMUNOHISTOCHEMICAL AND MORPHOMETRIC ANALYSIS PERFORMED MANUALLY The tumor cells are NEGATIVE for Her2  (1+). Estrogen Receptor: 95%, POSITIVE, STRONG STAINING INTENSITY Progesterone Receptor: 30%, POSITIVE, STRONG STAINING INTENSITY Proliferation Marker Ki67: 20%   01/08/2019 Surgery   LEFT BREAST LUMPECTOMY WITH RADIOACTIVE SEED AND LEFT AXILLARY DEEP SENTINEL LYMPH NODE BIOPSY WITH BLUE DYE INJECTION by Dr Gail 01/08/19    01/08/2019 Pathology Results   Diagnosis 01/08/19 1. Breast, lumpectomy, left - INVASIVE DUCTAL CARCINOMA, GRADE III/III, SPANNING 1.8 CM. - DUCTAL CARCINOMA IN SITU, HIGH GRADE. - DUCTAL CARCINOMA IN SITU IS FOCALLY PRESENT AT THE ANTERIOR MARGIN OF SPECIMEN #1. - DUCTAL CARCINOMA IN SITU IS FOCALLY LESS THAN 0.1 CM FROM THE INFERIOR MARGIN OF SPECIMEN # 1. - SEE ONCOLOGY TABLE BELOW. 2. Breast, excision, left medial margin - DUCTAL CARCINOMA IN SITU, FOCALLY PRESENT AT THE MEDIAL MARGIN OF SPECIMEN # 2. 3. Breast, excision, left posterior margin - BENIGN BREAST PARENCHYMA. - THERE IS NO EVIDENCE OF MALIGNANCY. - SEE COMMENT. 4. Lymph node, sentinel, biopsy, left axillary - THERE IS NO EVIDENCE OF CARCINOMA IN 1 OF 1 LYMPH NODE (0/1). 5. Lymph node, sentinel, biopsy, left axillary - THERE IS NO EVIDENCE OF CARCINOMA IN 1 OF 1 LYMPH NODE (0/1). 6. Lymph node, sentinel, biopsy, left axillary - THERE IS NO EVIDENCE OF CARCINOMA IN 1 OF 1 LYMPH NODE (0/1)   01/08/2019 Oncotype testing   recurrence score of 27 with risk of recurrence 16% with Tamoxifen or AI alone. There is a benefit of chemotherapy >15%    01/08/2019 Cancer Staging   Staging form: Breast, AJCC 8th Edition - Pathologic stage from 01/08/2019: Stage IA (pT1c, pN0, cM0, G3, ER+, PR+, HER2-) - Signed by Lanny Callander, MD on 01/22/2019   01/29/2019 Genetic Testing   Negative genetic testing on the common hereditary cancer panel.  The Common Hereditary Gene Panel offered by [facility] includes sequencing and/or deletion duplication testing of the following 48 genes: APC, ATM, AXIN2, BARD1, BMPR1A, BRCA1, BRCA2, BRIP1,  CDH1, CDK4, CDKN2A (p14ARF), CDKN2A (p16INK4a), CHEK2, CTNNA1, DICER1, EPCAM (Deletion/duplication testing only), GREM1 (promoter region deletion/duplication testing only), KIT, MEN1, MLH1, MSH2, MSH3, MSH6, MUTYH, NBN, NF1, NHTL1, PALB2, PDGFRA, PMS2, POLD1, POLE, PTEN, RAD50, RAD51C, RAD51D, RNF43, SDHB, SDHC, SDHD, SMAD4, SMARCA4. STK11, TP53, TSC1, TSC2, and VHL.  The following genes were evaluated for sequence changes only: SDHA and HOXB13 c.251G>A variant only. The report date is January 29, 2019.   03/01/2019 Surgery   RE-EXCISION OF LEFT BREAST LUMPECTOMY MULITPLE MARGINS by Dr. Gail  Diagnosis 1. Breast, excision, re-excision left anterior margin - BENIGN BREAST PARENCHYMA WITH PREVIOUS PROCEDURE-RELATED CHANGES -  NEGATIVE FOR IN SITU OR INVASIVE CARCINOMA 2. 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 SEE NOTE - DCIS IS FOCALLY LESS THAN 1 MM FROM NEW INFERIOR MARGIN - NEGATIVE FOR INVASIVE CARCINOMA - PREVIOUS PROCEDURE-RELATED CHANGES 3. Breast, excision, re-excision left medial margin - FOCAL DUCTAL CARCINOMA IN SITU (DCIS), 0.2 CM. SEE NOTE - DCIS IS 1.5 MM FROM NEW MEDIAL MARGIN - NEGATIVE FOR INVASIVE CARCINOMA - PREVIOUS PROCEDURE-RELATED CHANGES 4. Breast, excision, re-excision left final medial argin - BENIGN BREAST PARENCHYMA WITH MILD FOCAL PREVIOUS PROCEDURE-RELATED CHANGES - NEGATIVE FOR IN SITU OR INVASIVE CARCINOMA    03/22/2019 - 05/24/2019 Chemotherapy   TC q3weeks for 4 cycles starting 03/22/19 and completed on 05/24/19    06/24/2019 - 08/09/2019 Radiation Therapy   Radiation therapy with Dr. Dewey 06/24/19-08/09/19   08/2019 -  Anti-estrogen oral therapy   Letrozole  2.5mg  starting 08/2019   10/23/2019 Survivorship   SCP delivered by [name] Burton, NP        REVIEW OF SYSTEMS:   Constitutional: Denies fevers, chills or abnormal weight loss Eyes: Denies blurriness of vision Ears, nose, mouth, throat, and  face: Denies mucositis or sore throat Respiratory: Denies cough, dyspnea or wheezes Cardiovascular: Denies palpitation, chest discomfort or lower extremity swelling Gastrointestinal:  Denies nausea, heartburn or change in bowel habits Skin: Denies abnormal skin rashes Lymphatics: Denies new lymphadenopathy or easy bruising Neurological:Denies numbness, tingling or new weaknesses.  Severe peripheral neuropathy managed with gabapentin .  At baseline. Behavioral/Psych: Mood is stable, no new changes  All other systems were reviewed with the patient and are negative.   VITALS:   Today's Vitals   08/29/24 0919 08/29/24 0920  BP: 126/68   Pulse: 69   Resp: 17   Temp: 98 F (36.7 C)   SpO2: 98%   Weight: 248 lb 4.8 oz (112.6 kg)   PainSc:  0-No pain   Body mass index is 40.08 kg/m.    Wt Readings from Last 3 Encounters:  08/29/24 248 lb 4.8 oz (112.6 kg)  01/24/24 247 lb 3.2 oz (112.1 kg)  07/26/23 247 lb 12.8 oz (112.4 kg)    Body mass index is 40.08 kg/m.  Performance status (ECOG): 1 - Symptomatic but completely ambulatory  PHYSICAL EXAM:   GENERAL:alert, no distress and comfortable SKIN: skin color, texture, turgor are normal, no rashes or significant lesions EYES: normal, Conjunctiva are pink and non-injected, sclera clear OROPHARYNX:no exudate, no erythema and lips, buccal mucosa, and tongue normal  NECK: supple, thyroid normal size, non-tender, without nodularity LYMPH:  no palpable lymphadenopathy in the cervical, axillary or inguinal LUNGS: clear to auscultation and percussion with normal breathing effort HEART: regular rate & rhythm and no murmurs and no lower extremity edema ABDOMEN:abdomen soft, non-tender and normal bowel sounds Musculoskeletal:no cyanosis of digits and no clubbing  NEURO: alert & oriented x 3 with fluent speech, no focal motor/sensory deficits BREAST: There is well-healed lumpectomy scar on the left breast.  There are no palpable nodes or  masses noted today.  There are expected related to radiation treatment.  There is no nipple inversion or nipple discharge.  There is no axillary lymphadenopathy on the left.  There are no palpable lumps or masses noted in the right breast.  There is no nipple inversion or nipple discharge.  There is no axillary lymphadenopathy on the right.  LABORATORY DATA:  I have reviewed the data as listed    Component Value Date/Time   NA  140 08/29/2024 0855   NA 142 04/15/2020 1009   K 3.9 08/29/2024 0855   CL 104 08/29/2024 0855   CO2 28 08/29/2024 0855   GLUCOSE 224 (H) 08/29/2024 0855   BUN 9 08/29/2024 0855   BUN 7 04/15/2020 1009   CREATININE 1.36 (H) 08/29/2024 0855   CALCIUM 9.6 08/29/2024 0855   PROT 7.3 08/29/2024 0855   PROT 7.4 04/15/2020 1009   ALBUMIN 4.3 08/29/2024 0855   ALBUMIN 4.7 04/15/2020 1009   AST 13 (L) 08/29/2024 0855   ALT 6 08/29/2024 0855   ALKPHOS 93 08/29/2024 0855   BILITOT 0.7 08/29/2024 0855   GFRNONAA 44 (L) 08/29/2024 0855   GFRAA 55 (L) 04/15/2020 1009   GFRAA 50 (L) 01/22/2020 1001    Lab Results  Component Value Date   WBC 7.6 08/29/2024   NEUTROABS 5.3 08/29/2024   HGB 14.1 08/29/2024   HCT 45.6 08/29/2024   MCV 93.1 08/29/2024   PLT 261 08/29/2024    RADIOGRAPHIC STUDIES: MM 3D DIAGNOSTIC MAMMOGRAM BILATERAL BREAST Result Date: 08/20/2024 CLINICAL DATA:  64 year old with a personal history of malignant LEFT breast lumpectomy in 2020. Adjuvant radiation therapy and chemotherapy. Currently undergoing hormonal chemoprevention with letrozole . Annual evaluation. EXAM: DIGITAL DIAGNOSTIC BILATERAL MAMMOGRAM WITH TOMOSYNTHESIS AND CAD TECHNIQUE: Bilateral digital diagnostic mammography and breast tomosynthesis was performed. The images were evaluated with computer-aided detection. COMPARISON:  Previous exam(s). ACR Breast Density Category c: The breasts are heterogeneously dense, which may obscure small masses. FINDINGS: Full field CC and MLO views of  both breasts were obtained. RIGHT: No findings suspicious for malignancy. LEFT: No findings suspicious for malignancy. Mild scar/distortion at the lumpectomy site in the upper breast at posterior depth. IMPRESSION: 1. No mammographic evidence of malignancy involving either breast. 2. Expected post lumpectomy changes involving the LEFT breast. RECOMMENDATION: Per protocol, as the patient is now 2 or more years status post lumpectomy, she may return to annual screening mammography in 1 year. However, given the history of breast cancer, the patient remains eligible for annual diagnostic mammography if preferred. I have discussed the findings and recommendations with the patient. If applicable, a reminder letter will be sent to the patient regarding the next appointment. BI-RADS CATEGORY  2: Benign. Electronically Signed   By: Debby Satterfield M.D.   On: 08/20/2024 13:10   "

## 2024-08-29 ENCOUNTER — Inpatient Hospital Stay: Attending: Nurse Practitioner | Admitting: Nurse Practitioner

## 2024-08-29 ENCOUNTER — Inpatient Hospital Stay

## 2024-08-29 VITALS — BP 126/68 | HR 69 | Temp 98.0°F | Resp 17 | Wt 248.3 lb

## 2024-08-29 DIAGNOSIS — Z9221 Personal history of antineoplastic chemotherapy: Secondary | ICD-10-CM | POA: Insufficient documentation

## 2024-08-29 DIAGNOSIS — Z170 Estrogen receptor positive status [ER+]: Secondary | ICD-10-CM | POA: Diagnosis not present

## 2024-08-29 DIAGNOSIS — C50212 Malignant neoplasm of upper-inner quadrant of left female breast: Secondary | ICD-10-CM | POA: Insufficient documentation

## 2024-08-29 DIAGNOSIS — Z1721 Progesterone receptor positive status: Secondary | ICD-10-CM | POA: Diagnosis not present

## 2024-08-29 DIAGNOSIS — Z1732 Human epidermal growth factor receptor 2 negative status: Secondary | ICD-10-CM | POA: Insufficient documentation

## 2024-08-29 DIAGNOSIS — M8580 Other specified disorders of bone density and structure, unspecified site: Secondary | ICD-10-CM | POA: Insufficient documentation

## 2024-08-29 DIAGNOSIS — Z923 Personal history of irradiation: Secondary | ICD-10-CM | POA: Insufficient documentation

## 2024-08-29 DIAGNOSIS — Z79811 Long term (current) use of aromatase inhibitors: Secondary | ICD-10-CM | POA: Diagnosis not present

## 2024-08-29 LAB — CBC WITH DIFFERENTIAL (CANCER CENTER ONLY)
Abs Immature Granulocytes: 0.02 K/uL (ref 0.00–0.07)
Basophils Absolute: 0 K/uL (ref 0.0–0.1)
Basophils Relative: 1 %
Eosinophils Absolute: 0.1 K/uL (ref 0.0–0.5)
Eosinophils Relative: 2 %
HCT: 45.6 % (ref 36.0–46.0)
Hemoglobin: 14.1 g/dL (ref 12.0–15.0)
Immature Granulocytes: 0 %
Lymphocytes Relative: 23 %
Lymphs Abs: 1.8 K/uL (ref 0.7–4.0)
MCH: 28.8 pg (ref 26.0–34.0)
MCHC: 30.9 g/dL (ref 30.0–36.0)
MCV: 93.1 fL (ref 80.0–100.0)
Monocytes Absolute: 0.4 K/uL (ref 0.1–1.0)
Monocytes Relative: 5 %
Neutro Abs: 5.3 K/uL (ref 1.7–7.7)
Neutrophils Relative %: 69 %
Platelet Count: 261 K/uL (ref 150–400)
RBC: 4.9 MIL/uL (ref 3.87–5.11)
RDW: 15.2 % (ref 11.5–15.5)
WBC Count: 7.6 K/uL (ref 4.0–10.5)
nRBC: 0 % (ref 0.0–0.2)

## 2024-08-29 LAB — CMP (CANCER CENTER ONLY)
ALT: 6 U/L (ref 0–44)
AST: 13 U/L — ABNORMAL LOW (ref 15–41)
Albumin: 4.3 g/dL (ref 3.5–5.0)
Alkaline Phosphatase: 93 U/L (ref 38–126)
Anion gap: 9 (ref 5–15)
BUN: 9 mg/dL (ref 8–23)
CO2: 28 mmol/L (ref 22–32)
Calcium: 9.6 mg/dL (ref 8.9–10.3)
Chloride: 104 mmol/L (ref 98–111)
Creatinine: 1.36 mg/dL — ABNORMAL HIGH (ref 0.44–1.00)
GFR, Estimated: 44 mL/min — ABNORMAL LOW
Glucose, Bld: 224 mg/dL — ABNORMAL HIGH (ref 70–99)
Potassium: 3.9 mmol/L (ref 3.5–5.1)
Sodium: 140 mmol/L (ref 135–145)
Total Bilirubin: 0.7 mg/dL (ref 0.0–1.2)
Total Protein: 7.3 g/dL (ref 6.5–8.1)

## 2024-09-15 ENCOUNTER — Encounter: Payer: Self-pay | Admitting: Nurse Practitioner

## 2025-03-04 ENCOUNTER — Inpatient Hospital Stay

## 2025-03-04 ENCOUNTER — Inpatient Hospital Stay: Admitting: Nurse Practitioner
